# Patient Record
Sex: Male | Born: 1945 | Race: Black or African American | Hispanic: No | Marital: Married | Smoking: Light tobacco smoker
Health system: Southern US, Community
[De-identification: ages and names within clinical notes are randomized; demographics above are authoritative.]

## PROBLEM LIST (undated history)

## (undated) DIAGNOSIS — F429 Obsessive-compulsive disorder, unspecified: Secondary | ICD-10-CM

## (undated) DIAGNOSIS — R519 Headache, unspecified: Secondary | ICD-10-CM

## (undated) DIAGNOSIS — G4730 Sleep apnea, unspecified: Secondary | ICD-10-CM

## (undated) DIAGNOSIS — Z9889 Other specified postprocedural states: Secondary | ICD-10-CM

## (undated) DIAGNOSIS — C8590 Non-Hodgkin lymphoma, unspecified, unspecified site: Secondary | ICD-10-CM

## (undated) DIAGNOSIS — R7303 Prediabetes: Secondary | ICD-10-CM

## (undated) DIAGNOSIS — I10 Essential (primary) hypertension: Secondary | ICD-10-CM

## (undated) DIAGNOSIS — E079 Disorder of thyroid, unspecified: Secondary | ICD-10-CM

## (undated) DIAGNOSIS — Z8572 Personal history of non-Hodgkin lymphomas: Secondary | ICD-10-CM

## (undated) DIAGNOSIS — R002 Palpitations: Secondary | ICD-10-CM

## (undated) DIAGNOSIS — F419 Anxiety disorder, unspecified: Secondary | ICD-10-CM

## (undated) DIAGNOSIS — K2960 Other gastritis without bleeding: Secondary | ICD-10-CM

## (undated) HISTORY — DX: Obsessive-compulsive disorder, unspecified: F42.9

## (undated) HISTORY — PX: EXPLORATORY LAPAROTOMY: SUR591

## (undated) HISTORY — DX: Personal history of non-Hodgkin lymphomas: Z85.72

## (undated) HISTORY — DX: Essential (primary) hypertension: I10

## (undated) HISTORY — DX: Non-Hodgkin lymphoma, unspecified, unspecified site: C85.90

## (undated) HISTORY — PX: LYMPH NODE BIOPSY: SHX201

## (undated) HISTORY — DX: Other gastritis without bleeding: K29.60

## (undated) HISTORY — DX: Other specified postprocedural states: Z98.890

## (undated) HISTORY — PX: OTHER SURGICAL HISTORY: SHX169

## (undated) HISTORY — PX: ESOPHAGOGASTRODUODENOSCOPY ENDOSCOPY: SHX5814

## (undated) HISTORY — DX: Sleep apnea, unspecified: G47.30

## (undated) HISTORY — DX: Anxiety disorder, unspecified: F41.9

---

## 2003-05-02 ENCOUNTER — Inpatient Hospital Stay (HOSPITAL_COMMUNITY): Admission: EM | Admit: 2003-05-02 | Discharge: 2003-05-12 | Payer: Self-pay | Admitting: Emergency Medicine

## 2003-05-15 ENCOUNTER — Inpatient Hospital Stay (HOSPITAL_COMMUNITY): Admission: AD | Admit: 2003-05-15 | Discharge: 2003-05-20 | Payer: Self-pay | Admitting: Internal Medicine

## 2003-05-22 ENCOUNTER — Encounter (HOSPITAL_COMMUNITY): Admission: RE | Admit: 2003-05-22 | Discharge: 2003-06-21 | Payer: Self-pay | Admitting: Oncology

## 2003-05-22 ENCOUNTER — Encounter: Admission: RE | Admit: 2003-05-22 | Discharge: 2003-05-22 | Payer: Self-pay | Admitting: Oncology

## 2003-06-23 ENCOUNTER — Encounter: Admission: RE | Admit: 2003-06-23 | Discharge: 2003-06-23 | Payer: Self-pay | Admitting: Oncology

## 2003-06-23 ENCOUNTER — Encounter (HOSPITAL_COMMUNITY): Admission: RE | Admit: 2003-06-23 | Discharge: 2003-07-23 | Payer: Self-pay | Admitting: Oncology

## 2003-07-31 ENCOUNTER — Encounter: Admission: RE | Admit: 2003-07-31 | Discharge: 2003-07-31 | Payer: Self-pay | Admitting: Oncology

## 2003-07-31 ENCOUNTER — Encounter (HOSPITAL_COMMUNITY): Admission: RE | Admit: 2003-07-31 | Discharge: 2003-08-30 | Payer: Self-pay | Admitting: Oncology

## 2003-08-02 ENCOUNTER — Ambulatory Visit (HOSPITAL_COMMUNITY): Admission: RE | Admit: 2003-08-02 | Discharge: 2003-08-02 | Payer: Self-pay | Admitting: Oncology

## 2003-09-04 ENCOUNTER — Encounter (HOSPITAL_COMMUNITY): Admission: RE | Admit: 2003-09-04 | Discharge: 2003-10-04 | Payer: Self-pay | Admitting: Oncology

## 2003-09-04 ENCOUNTER — Encounter: Admission: RE | Admit: 2003-09-04 | Discharge: 2003-09-04 | Payer: Self-pay | Admitting: Oncology

## 2003-10-12 ENCOUNTER — Ambulatory Visit (HOSPITAL_COMMUNITY): Admission: RE | Admit: 2003-10-12 | Discharge: 2003-10-12 | Payer: Self-pay | Admitting: Internal Medicine

## 2003-11-28 ENCOUNTER — Encounter: Admission: RE | Admit: 2003-11-28 | Discharge: 2003-11-28 | Payer: Self-pay | Admitting: Oncology

## 2003-11-28 ENCOUNTER — Encounter (HOSPITAL_COMMUNITY): Admission: RE | Admit: 2003-11-28 | Discharge: 2003-12-28 | Payer: Self-pay | Admitting: Oncology

## 2004-01-29 ENCOUNTER — Encounter (HOSPITAL_COMMUNITY): Admission: RE | Admit: 2004-01-29 | Discharge: 2004-02-28 | Payer: Self-pay | Admitting: Oncology

## 2004-01-29 ENCOUNTER — Encounter: Admission: RE | Admit: 2004-01-29 | Discharge: 2004-01-29 | Payer: Self-pay | Admitting: Oncology

## 2004-02-21 ENCOUNTER — Ambulatory Visit (HOSPITAL_COMMUNITY): Admission: RE | Admit: 2004-02-21 | Discharge: 2004-02-21 | Payer: Self-pay | Admitting: Oncology

## 2004-03-01 ENCOUNTER — Ambulatory Visit: Payer: Self-pay | Admitting: Internal Medicine

## 2004-03-01 ENCOUNTER — Ambulatory Visit (HOSPITAL_COMMUNITY): Admission: RE | Admit: 2004-03-01 | Discharge: 2004-03-01 | Payer: Self-pay | Admitting: Internal Medicine

## 2004-03-05 ENCOUNTER — Ambulatory Visit (HOSPITAL_COMMUNITY): Payer: Self-pay | Admitting: Oncology

## 2004-06-04 ENCOUNTER — Encounter (HOSPITAL_COMMUNITY): Admission: RE | Admit: 2004-06-04 | Discharge: 2004-07-04 | Payer: Self-pay | Admitting: Oncology

## 2004-06-04 ENCOUNTER — Ambulatory Visit (HOSPITAL_COMMUNITY): Payer: Self-pay | Admitting: Oncology

## 2004-06-04 ENCOUNTER — Encounter: Admission: RE | Admit: 2004-06-04 | Discharge: 2004-06-04 | Payer: Self-pay | Admitting: Oncology

## 2004-08-19 ENCOUNTER — Ambulatory Visit (HOSPITAL_COMMUNITY): Admission: RE | Admit: 2004-08-19 | Discharge: 2004-08-19 | Payer: Self-pay | Admitting: Oncology

## 2004-08-22 ENCOUNTER — Encounter: Admission: RE | Admit: 2004-08-22 | Discharge: 2004-08-22 | Payer: Self-pay | Admitting: Oncology

## 2004-08-22 ENCOUNTER — Encounter (HOSPITAL_COMMUNITY): Admission: RE | Admit: 2004-08-22 | Discharge: 2004-09-21 | Payer: Self-pay | Admitting: Oncology

## 2004-08-22 ENCOUNTER — Ambulatory Visit (HOSPITAL_COMMUNITY): Payer: Self-pay | Admitting: Oncology

## 2004-11-22 ENCOUNTER — Encounter (HOSPITAL_COMMUNITY): Admission: RE | Admit: 2004-11-22 | Discharge: 2004-12-22 | Payer: Self-pay | Admitting: Oncology

## 2004-11-22 ENCOUNTER — Encounter: Admission: RE | Admit: 2004-11-22 | Discharge: 2004-11-22 | Payer: Self-pay | Admitting: Oncology

## 2004-11-22 ENCOUNTER — Ambulatory Visit (HOSPITAL_COMMUNITY): Payer: Self-pay | Admitting: Oncology

## 2005-02-24 ENCOUNTER — Encounter: Admission: RE | Admit: 2005-02-24 | Discharge: 2005-02-24 | Payer: Self-pay | Admitting: Oncology

## 2005-02-24 ENCOUNTER — Ambulatory Visit (HOSPITAL_COMMUNITY): Payer: Self-pay | Admitting: Oncology

## 2005-02-24 ENCOUNTER — Encounter (HOSPITAL_COMMUNITY): Admission: RE | Admit: 2005-02-24 | Discharge: 2005-03-26 | Payer: Self-pay | Admitting: Oncology

## 2005-03-04 ENCOUNTER — Ambulatory Visit (HOSPITAL_COMMUNITY): Admission: RE | Admit: 2005-03-04 | Discharge: 2005-03-04 | Payer: Self-pay | Admitting: Oncology

## 2005-06-05 ENCOUNTER — Encounter: Admission: RE | Admit: 2005-06-05 | Discharge: 2005-06-05 | Payer: Self-pay | Admitting: Oncology

## 2005-06-05 ENCOUNTER — Ambulatory Visit (HOSPITAL_COMMUNITY): Payer: Self-pay | Admitting: Oncology

## 2005-06-05 ENCOUNTER — Encounter (HOSPITAL_COMMUNITY): Admission: RE | Admit: 2005-06-05 | Discharge: 2005-07-05 | Payer: Self-pay | Admitting: Oncology

## 2005-07-15 ENCOUNTER — Encounter: Admission: RE | Admit: 2005-07-15 | Discharge: 2005-07-15 | Payer: Self-pay | Admitting: Oncology

## 2005-08-22 ENCOUNTER — Ambulatory Visit (HOSPITAL_COMMUNITY): Admission: RE | Admit: 2005-08-22 | Discharge: 2005-08-22 | Payer: Self-pay | Admitting: Oncology

## 2005-12-18 ENCOUNTER — Encounter: Admission: RE | Admit: 2005-12-18 | Discharge: 2006-01-02 | Payer: Self-pay | Admitting: Oncology

## 2005-12-18 ENCOUNTER — Ambulatory Visit (HOSPITAL_COMMUNITY): Payer: Self-pay | Admitting: Oncology

## 2005-12-18 ENCOUNTER — Encounter (HOSPITAL_COMMUNITY): Admission: RE | Admit: 2005-12-18 | Discharge: 2006-01-02 | Payer: Self-pay | Admitting: Oncology

## 2006-01-20 ENCOUNTER — Ambulatory Visit (HOSPITAL_COMMUNITY): Admission: RE | Admit: 2006-01-20 | Discharge: 2006-01-20 | Payer: Self-pay | Admitting: Oncology

## 2006-03-02 ENCOUNTER — Ambulatory Visit (HOSPITAL_COMMUNITY): Payer: Self-pay | Admitting: Oncology

## 2006-06-19 ENCOUNTER — Ambulatory Visit (HOSPITAL_COMMUNITY): Payer: Self-pay | Admitting: Oncology

## 2006-06-19 ENCOUNTER — Encounter (HOSPITAL_COMMUNITY): Admission: RE | Admit: 2006-06-19 | Discharge: 2006-07-19 | Payer: Self-pay | Admitting: Oncology

## 2006-07-22 ENCOUNTER — Ambulatory Visit (HOSPITAL_COMMUNITY): Admission: RE | Admit: 2006-07-22 | Discharge: 2006-07-22 | Payer: Self-pay | Admitting: Oncology

## 2007-01-22 ENCOUNTER — Ambulatory Visit (HOSPITAL_COMMUNITY): Admission: RE | Admit: 2007-01-22 | Discharge: 2007-01-22 | Payer: Self-pay | Admitting: Oncology

## 2007-01-26 ENCOUNTER — Ambulatory Visit (HOSPITAL_COMMUNITY): Payer: Self-pay | Admitting: Oncology

## 2007-01-26 ENCOUNTER — Encounter (HOSPITAL_COMMUNITY): Admission: RE | Admit: 2007-01-26 | Discharge: 2007-02-25 | Payer: Self-pay | Admitting: Oncology

## 2007-03-23 ENCOUNTER — Encounter (HOSPITAL_COMMUNITY): Admission: RE | Admit: 2007-03-23 | Discharge: 2007-04-07 | Payer: Self-pay | Admitting: Oncology

## 2007-03-24 ENCOUNTER — Ambulatory Visit (HOSPITAL_COMMUNITY): Payer: Self-pay | Admitting: Oncology

## 2007-07-21 ENCOUNTER — Ambulatory Visit (HOSPITAL_COMMUNITY): Payer: Self-pay | Admitting: Oncology

## 2007-07-21 ENCOUNTER — Encounter (HOSPITAL_COMMUNITY): Admission: RE | Admit: 2007-07-21 | Discharge: 2007-08-20 | Payer: Self-pay | Admitting: Oncology

## 2007-07-23 ENCOUNTER — Ambulatory Visit (HOSPITAL_COMMUNITY): Admission: RE | Admit: 2007-07-23 | Discharge: 2007-07-23 | Payer: Self-pay | Admitting: Oncology

## 2008-01-24 ENCOUNTER — Ambulatory Visit (HOSPITAL_COMMUNITY): Payer: Self-pay | Admitting: Oncology

## 2008-02-08 ENCOUNTER — Ambulatory Visit (HOSPITAL_COMMUNITY): Admission: RE | Admit: 2008-02-08 | Discharge: 2008-02-08 | Payer: Self-pay | Admitting: Oncology

## 2008-02-10 ENCOUNTER — Ambulatory Visit: Admission: RE | Admit: 2008-02-10 | Discharge: 2008-02-10 | Payer: Self-pay

## 2008-07-25 ENCOUNTER — Encounter (HOSPITAL_COMMUNITY): Admission: RE | Admit: 2008-07-25 | Discharge: 2008-08-24 | Payer: Self-pay | Admitting: Oncology

## 2008-07-25 ENCOUNTER — Ambulatory Visit (HOSPITAL_COMMUNITY): Payer: Self-pay | Admitting: Oncology

## 2008-08-08 ENCOUNTER — Ambulatory Visit (HOSPITAL_COMMUNITY): Admission: RE | Admit: 2008-08-08 | Discharge: 2008-08-08 | Payer: Self-pay | Admitting: Oncology

## 2009-01-22 ENCOUNTER — Encounter (HOSPITAL_COMMUNITY): Admission: RE | Admit: 2009-01-22 | Discharge: 2009-02-21 | Payer: Self-pay | Admitting: Oncology

## 2009-01-22 ENCOUNTER — Ambulatory Visit (HOSPITAL_COMMUNITY): Payer: Self-pay | Admitting: Oncology

## 2009-03-27 ENCOUNTER — Ambulatory Visit (HOSPITAL_COMMUNITY): Admission: RE | Admit: 2009-03-27 | Discharge: 2009-03-27 | Payer: Self-pay | Admitting: Oncology

## 2009-06-14 ENCOUNTER — Ambulatory Visit: Payer: Self-pay | Admitting: Cardiology

## 2009-06-15 ENCOUNTER — Observation Stay (HOSPITAL_COMMUNITY): Admission: EM | Admit: 2009-06-15 | Discharge: 2009-06-15 | Payer: Self-pay | Admitting: Internal Medicine

## 2009-06-15 ENCOUNTER — Ambulatory Visit: Payer: Self-pay | Admitting: Cardiology

## 2009-06-26 DIAGNOSIS — I10 Essential (primary) hypertension: Secondary | ICD-10-CM

## 2009-06-26 DIAGNOSIS — Z8572 Personal history of non-Hodgkin lymphomas: Secondary | ICD-10-CM | POA: Insufficient documentation

## 2009-06-26 DIAGNOSIS — F411 Generalized anxiety disorder: Secondary | ICD-10-CM | POA: Insufficient documentation

## 2009-06-26 DIAGNOSIS — G4730 Sleep apnea, unspecified: Secondary | ICD-10-CM | POA: Insufficient documentation

## 2009-06-26 HISTORY — DX: Personal history of non-Hodgkin lymphomas: Z85.72

## 2009-06-29 ENCOUNTER — Ambulatory Visit: Payer: Self-pay | Admitting: Cardiology

## 2009-06-29 DIAGNOSIS — R072 Precordial pain: Secondary | ICD-10-CM | POA: Insufficient documentation

## 2009-07-02 ENCOUNTER — Encounter: Payer: Self-pay | Admitting: Adult Health

## 2009-07-20 ENCOUNTER — Encounter: Payer: Self-pay | Admitting: Cardiology

## 2009-07-20 ENCOUNTER — Encounter (HOSPITAL_COMMUNITY)
Admission: RE | Admit: 2009-07-20 | Discharge: 2009-08-19 | Payer: Self-pay | Source: Home / Self Care | Admitting: Oncology

## 2009-07-20 ENCOUNTER — Ambulatory Visit (HOSPITAL_COMMUNITY): Payer: Self-pay | Admitting: Oncology

## 2010-02-01 ENCOUNTER — Encounter (HOSPITAL_COMMUNITY)
Admission: RE | Admit: 2010-02-01 | Discharge: 2010-03-03 | Payer: Self-pay | Source: Home / Self Care | Admitting: Oncology

## 2010-02-01 ENCOUNTER — Ambulatory Visit (HOSPITAL_COMMUNITY): Payer: Self-pay | Admitting: Oncology

## 2010-04-03 ENCOUNTER — Ambulatory Visit (HOSPITAL_COMMUNITY)
Admission: RE | Admit: 2010-04-03 | Discharge: 2010-04-03 | Payer: Self-pay | Source: Home / Self Care | Attending: Oncology | Admitting: Oncology

## 2010-04-09 ENCOUNTER — Ambulatory Visit (HOSPITAL_COMMUNITY)
Admission: RE | Admit: 2010-04-09 | Discharge: 2010-05-07 | Payer: Self-pay | Source: Home / Self Care | Attending: Oncology | Admitting: Oncology

## 2010-04-09 ENCOUNTER — Encounter (HOSPITAL_COMMUNITY)
Admission: RE | Admit: 2010-04-09 | Discharge: 2010-05-07 | Payer: Self-pay | Source: Home / Self Care | Attending: Oncology | Admitting: Oncology

## 2010-04-28 ENCOUNTER — Encounter (HOSPITAL_COMMUNITY): Payer: Self-pay | Admitting: Oncology

## 2010-05-07 NOTE — Assessment & Plan Note (Signed)
Summary: eph post cath   Visit Type:  Follow-up Primary Provider:  Nehemiah Settle  CC:  No Cardiology Complaints today.  History of Present Illness: Mr. William Joseph is a very friendly 59 AAM who we are seeing post hospitalization after transfer to [facility] for cardiac cath on 06/15/2009. 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  He was initially seen on consult in [facility]  while hospitilized for bilateral arm numbness, substernal chest discomfort, and diaphoresis. His CE at [facility] were negative but he had some EKG changes of T-Wave inversion inferiorly.  After being seen by Dr. Juanito Doom is was recommended that he undergo cardiac catherization.  This was completed by Dr.Dalton Shirlee Latch on 06/15/2009 revealing normal coronary arteries, Normal EF of 55% with no wall motion abnormalities.  The coronary system is right dominant.  The procedure was done radially.  Mr. [name] is without complaint. He feels very well and has had no recurrance of symptoms.  His R wrist is well healed from radial puncture for cath.  He remains active and is accompanied by his wife who concurs with the patient's discussion.  Current Medications (verified): 1)  Avodart 0.5 Mg Caps (Dutasteride) .... Take 1 Tab Daily 2)  Allegra-D 12 Hour 60-120 Mg Xr12h-Tab (Fexofenadine-Pseudoephedrine) .... Take 1 Tab Daily 3)  Lexapro 10 Mg Tabs (Escitalopram Oxalate) .... Take 1 Tab Daily 4)  Flomax 0.4 Mg Caps (Tamsulosin Hcl) .... Take 1 Tab Daily  Allergies (verified): No Known Drug Allergies PMH-FH-SH reviewed-no changes except otherwise noted  Review of Systems       All other systems have been reviewed and are negative unless stated above.   Vital Signs:  Patient profile:   65 year old male Height:      74 inches Weight:      228 pounds BMI:     29.38 Pulse rate:   76 / minute BP sitting:   148 / 80  (right arm)  Vitals Entered By:  Dreama Saa, CNA (June 29, 2009 2:57 PM)  Physical Exam  General:  Well developed, well nourished, in no acute distress. Lungs:  Clear bilaterally to auscultation and percussion. Abdomen:  Bowel sounds positive; abdomen soft and non-tender without masses, organomegaly, or hernias noted. No hepatosplenomegaly. Extremities:  Right wrist is free of hematoma or bleed at catheter puncture site. Psych:  Normal affect.   EKG  Procedure date:  06/29/2009  Findings:      Normal sinus rhythm with rate of:  77bpm  Impression & Recommendations:  Problem # 1:  HYPERTENSION (ICD-401.9) Assessment Unchanged  Problem # 2:  CHEST PAIN UNSPECIFIED (ICD-786.50) Cardiac catherization is reassuring.  It may have been stress induced.  We will follow him on as needed basis only. Happy to see him again at the discretion of Dr. Juanetta Gosling,  The patient states he feels better about his heart since having the catherization.  Patient Instructions: 1)  Your physician recommends that you schedule a follow-up appointment in: as needed  2)  Your physician recommends that you continue on your current medications as directed. Please refer to the Current Medication list given to you today.

## 2010-05-07 NOTE — Letter (Signed)
Summary: Discharge Summary  Discharge Summary   Imported By: Faythe Ghee 07/20/2009 15:35:01  _____________________________________________________________________  External Attachment:    Type:   Image     Comment:   External Document

## 2010-06-17 LAB — GLUCOSE, CAPILLARY: Glucose-Capillary: 112 mg/dL — ABNORMAL HIGH (ref 70–99)

## 2010-06-19 LAB — COMPREHENSIVE METABOLIC PANEL
ALT: 17 U/L (ref 0–53)
Albumin: 4.1 g/dL (ref 3.5–5.2)
CO2: 26 mEq/L (ref 19–32)
Chloride: 104 mEq/L (ref 96–112)
Sodium: 141 mEq/L (ref 135–145)
Total Bilirubin: 0.8 mg/dL (ref 0.3–1.2)
Total Protein: 6.9 g/dL (ref 6.0–8.3)

## 2010-06-19 LAB — DIFFERENTIAL
Basophils Absolute: 0 10*3/uL (ref 0.0–0.1)
Eosinophils Relative: 3 % (ref 0–5)
Lymphocytes Relative: 51 % — ABNORMAL HIGH (ref 12–46)
Lymphs Abs: 2.9 10*3/uL (ref 0.7–4.0)
Monocytes Absolute: 0.4 10*3/uL (ref 0.1–1.0)
Monocytes Relative: 8 % (ref 3–12)
Neutro Abs: 2.2 10*3/uL (ref 1.7–7.7)
Neutrophils Relative %: 39 % — ABNORMAL LOW (ref 43–77)

## 2010-06-19 LAB — CBC
Hemoglobin: 12.9 g/dL — ABNORMAL LOW (ref 13.0–17.0)
MCH: 30.2 pg (ref 26.0–34.0)
MCHC: 33 g/dL (ref 30.0–36.0)
RBC: 4.26 MIL/uL (ref 4.22–5.81)

## 2010-06-19 LAB — SEDIMENTATION RATE: Sed Rate: 10 mm/hr (ref 0–16)

## 2010-06-25 LAB — COMPREHENSIVE METABOLIC PANEL
ALT: 19 U/L (ref 0–53)
Albumin: 3.8 g/dL (ref 3.5–5.2)
Chloride: 111 mEq/L (ref 96–112)
Creatinine, Ser: 0.93 mg/dL (ref 0.4–1.5)
GFR calc Af Amer: >60 mL/min (ref >60)
Glucose, Bld: 97 mg/dL (ref 70–99)
Potassium: 4.4 mEq/L (ref 3.5–5.1)
Sodium: 140 mEq/L (ref 135–145)
Total Protein: 6.6 g/dL (ref 6.0–8.3)

## 2010-06-25 LAB — DIFFERENTIAL
Basophils Absolute: 0 10*3/uL (ref 0.0–0.1)
Basophils Relative: 0 % (ref 0–1)
Eosinophils Absolute: 0.4 10*3/uL (ref 0.0–0.7)
Eosinophils Relative: 7 % — ABNORMAL HIGH (ref 0–5)
Lymphocytes Relative: 42 % (ref 12–46)
Monocytes Absolute: 0.4 10*3/uL (ref 0.1–1.0)
Monocytes Relative: 8 % (ref 3–12)
Neutrophils Relative %: 43 % (ref 43–77)

## 2010-06-25 LAB — LACTATE DEHYDROGENASE: LDH: 128 U/L (ref 94–250)

## 2010-06-25 LAB — CBC
MCHC: 34.2 g/dL (ref 30.0–36.0)
Platelets: 162 10*3/uL (ref 150–400)
WBC: 5.3 10*3/uL (ref 4.0–10.5)

## 2010-06-25 LAB — SEDIMENTATION RATE: Sed Rate: 3 mm/hr (ref 0–16)

## 2010-06-30 LAB — CBC
HCT: 38 % — ABNORMAL LOW (ref 39.0–52.0)
MCHC: 33.9 g/dL (ref 30.0–36.0)
Platelets: 182 10*3/uL (ref 150–400)
RDW: 14 % (ref 11.5–15.5)
WBC: 5.7 10*3/uL (ref 4.0–10.5)

## 2010-06-30 LAB — BASIC METABOLIC PANEL
BUN: 8 mg/dL (ref 6–23)
GFR calc non Af Amer: >60 mL/min (ref >60)
Glucose, Bld: 95 mg/dL (ref 70–99)
Potassium: 4.5 mEq/L (ref 3.5–5.1)

## 2010-06-30 LAB — DIFFERENTIAL
Basophils Absolute: 0 10*3/uL (ref 0.0–0.1)
Eosinophils Relative: 5 % (ref 0–5)
Lymphocytes Relative: 43 % (ref 12–46)
Neutro Abs: 2.3 10*3/uL (ref 1.7–7.7)
Neutrophils Relative %: 41 % — ABNORMAL LOW (ref 43–77)

## 2010-06-30 LAB — CARDIAC PANEL(CRET KIN+CKTOT+MB+TROPI)
CK, MB: 1.6 ng/mL (ref 0.3–4.0)
CK, MB: 1.7 ng/mL (ref 0.3–4.0)
Relative Index: 1 (ref 0.0–2.5)
Relative Index: 1 (ref 0.0–2.5)
Troponin I: 0.01 ng/mL (ref 0.00–0.06)

## 2010-06-30 LAB — POCT CARDIAC MARKERS
Myoglobin, poc: 62.4 ng/mL (ref 12–200)
Troponin i, poc: <0.05 ng/mL (ref 0.00–0.09)

## 2010-07-08 LAB — GLUCOSE, CAPILLARY: Glucose-Capillary: 104 mg/dL — ABNORMAL HIGH (ref 70–99)

## 2010-07-11 LAB — DIFFERENTIAL
Basophils Absolute: 0 10*3/uL (ref 0.0–0.1)
Basophils Relative: 1 % (ref 0–1)
Eosinophils Absolute: 0.2 10*3/uL (ref 0.0–0.7)
Eosinophils Relative: 5 % (ref 0–5)
Monocytes Absolute: 0.4 10*3/uL (ref 0.1–1.0)

## 2010-07-11 LAB — COMPREHENSIVE METABOLIC PANEL
ALT: 19 U/L (ref 0–53)
AST: 20 U/L (ref 0–37)
Albumin: 4 g/dL (ref 3.5–5.2)
Alkaline Phosphatase: 49 U/L (ref 39–117)
CO2: 27 mEq/L (ref 19–32)
Chloride: 107 mEq/L (ref 96–112)
GFR calc Af Amer: >60 mL/min (ref >60)
GFR calc non Af Amer: >60 mL/min (ref >60)
Potassium: 4.1 mEq/L (ref 3.5–5.1)
Total Bilirubin: 0.8 mg/dL (ref 0.3–1.2)

## 2010-07-11 LAB — CBC
HCT: 38.4 % — ABNORMAL LOW (ref 39.0–52.0)
Platelets: 165 10*3/uL (ref 150–400)
RDW: 13.9 % (ref 11.5–15.5)

## 2010-07-11 LAB — SEDIMENTATION RATE: Sed Rate: 6 mm/hr (ref 0–16)

## 2010-07-17 LAB — IMMUNOFIXATION ELECTROPHORESIS
IgG (Immunoglobin G), Serum: 1280 mg/dL (ref 694–1618)
Total Protein ELP: 7.4 g/dL (ref 6.0–8.3)

## 2010-07-17 LAB — DIFFERENTIAL
Basophils Absolute: 0 10*3/uL (ref 0.0–0.1)
Lymphocytes Relative: 44 % (ref 12–46)
Lymphs Abs: 2.1 10*3/uL (ref 0.7–4.0)
Monocytes Absolute: 0.4 10*3/uL (ref 0.1–1.0)
Neutro Abs: 2.1 10*3/uL (ref 1.7–7.7)

## 2010-07-17 LAB — COMPREHENSIVE METABOLIC PANEL
Albumin: 3.9 g/dL (ref 3.5–5.2)
BUN: 12 mg/dL (ref 6–23)
Calcium: 9.6 mg/dL (ref 8.4–10.5)
Chloride: 104 mEq/L (ref 96–112)
Creatinine, Ser: 0.9 mg/dL (ref 0.4–1.5)
Total Bilirubin: 0.6 mg/dL (ref 0.3–1.2)

## 2010-07-17 LAB — SEDIMENTATION RATE: Sed Rate: 10 mm/hr (ref 0–16)

## 2010-07-17 LAB — CBC
HCT: 40.1 % (ref 39.0–52.0)
MCHC: 33 g/dL (ref 30.0–36.0)
MCV: 93.6 fL (ref 78.0–100.0)
Platelets: 161 10*3/uL (ref 150–400)
RDW: 13.3 % (ref 11.5–15.5)
WBC: 4.7 10*3/uL (ref 4.0–10.5)

## 2010-07-17 LAB — PROTEIN ELECTROPH W RFLX QUANT IMMUNOGLOBULINS
Alpha-1-Globulin: 3.7 % (ref 2.9–4.9)
Alpha-2-Globulin: 9 % (ref 7.1–11.8)
Beta 2: 5.5 % (ref 3.2–6.5)
Gamma Globulin: 14.7 % (ref 11.1–18.8)

## 2010-08-20 NOTE — Procedures (Signed)
NAME:  William Joseph, William Joseph        ACCOUNT NO.:  [id number]   MEDICAL RECORD NO.:  [id number]          PATIENT TYPE:  OUT   LOCATION:  SLEE                          FACILITY:  [facility]   PHYSICIAN:  Kofi A. Gerilyn Pilgrim, M.D. DATE OF BIRTH:  10-09-1945   DATE OF PROCEDURE:  DATE OF DISCHARGE:  02/10/2008                             SLEEP DISORDER REPORT   POLYSOMNOGRAPHY REPORT   REFERRING PHYSICIAN:  Edward L. Juanetta Gosling, MD   INDICATIONS:  This is a 65 year old man who presents with witnessed  apnea, loud snoring, and hypersomnia.  He is being evaluated for  obstructive sleep apnea syndrome.   MEDICATIONS:  1. Flomax.  2. Lexapro.  3. Antivert.   Epworth sleepiness scale 1.  BMI 28.   ARCHITECTURAL SUMMARY:  This is a split night study with the first part  being a diagnostic and the second part a titration.  The total recording  time is 416 minutes.  Sleep efficiency 72%.  Sleep latency 30 minutes.  REM latency 216 minutes.   RESPIRATORY SUMMARY:  The diagnostic AHI is 20.  The baseline oxygen  saturation 97%.  The lowest saturation is 90%.  The patient was titrated  on positive pressure between 5 and 11.  The optimal pressure is 11 with  resolution of obstructive events.   LIMB MOVEMENT SUMMARY:  PLM index of 26 with the most of the events  occurring during the first half of the study and less during the  titration portion.   ELECTROCARDIOGRAM SUMMARY:  Average heart rate 72 with no significant  dysrhythmias observed.   IMPRESSION:  1. Moderate obstructive sleep apnea syndrome, which responded well to      continuous positive airway pressure of      11.  2. Moderate periodic limb movement disorder of sleep.   Thanks for this referral.      Kofi A. Gerilyn Pilgrim, M.D.  Electronically Signed     KAD/MEDQ  D:  02/15/2008  T:  02/15/2008  Job:  [id number]

## 2010-08-23 NOTE — Procedures (Signed)
NAME:  PHILLP, DOLORES                    ACCOUNT NO.:  [id number]   MEDICAL RECORD NO.:  [id number]                  PATIENT TYPE:   LOCATION:                                       FACILITY:   PHYSICIAN:  Vida Roller, M.D.                DATE OF BIRTH:   DATE OF PROCEDURE:  07/14/2003  DATE OF DISCHARGE:                                  ECHOCARDIOGRAM   TAPE NUMBER:  LB [id number], tape count [id number] through 4124.   INDICATION:  This is a 65 year old man who is receiving chemotherapy for  lymphoma.  This is an assessment for LV function.   TECHNICAL QUALITY:  Adequate.   M-MODE TRACINGS:  1. The aorta is 37 mm.  2. The left atrium is 33 mm.  3. The septum is 12 mm.  4. The posterior wall is 12 mm.  5. The left ventricular diastolic dimension is 43 mm.  6. The left ventricular systolic dimension is 31 mm.   2-D AND DOPPLER IMAGING:  1. The left ventricle is normal size with normal systolic function.  No wall-     motion abnormalities are seen.  Diastolic function appears normal.  2. The right ventricle is normal size with normal systolic function.  3. The left and right atrium are both normal size.  There is no evidence of     an antral septal defect.  4. The aortic valve is trileaflet, tricommisure with no evidence of stenosis     or regurgitation.  5. The mitral valve is morphologically unremarkable with no stenosis or     regurgitation.  6. The tricuspid valve is morphologically unremarkable with trivial     insufficiency.  No stenosis is seen.  7. The pulmonic valve is not well seen.  8. The inferior vena cava is normal size.  9. The ascending aorta was not well seen.  10.      The pericardial structures are normal.      ___________________________________________                                            Vida Roller, M.D.   JH/MEDQ  D:  07/14/2003  T:  07/14/2003  Job:  [id number]

## 2010-08-23 NOTE — Op Note (Signed)
NAME:  William Joseph, William Joseph                    ACCOUNT NO.:  [id number]   MEDICAL RECORD NO.:  [id number]                   PATIENT TYPE:  AMB   LOCATION:  DAY                                  FACILITY:  [facility]   PHYSICIAN:  Dalia Heading, M.D.               DATE OF BIRTH:  08-12-1945   DATE OF PROCEDURE:  10/12/2003  DATE OF DISCHARGE:                                 OPERATIVE REPORT   PREOPERATIVE DIAGNOSIS:  History of lymphoma, finished with chemotherapy.   POSTOPERATIVE DIAGNOSIS:  History of lymphoma, finished with chemotherapy.   PROCEDURE:  Port-A-Cath removal.   SURGEON:  Dalia Heading, M.D.   ANESTHESIA:  Local.   INDICATIONS:  The patient is a 65 year old black male who has a history of  lymphoma.  He is finished with his chemotherapy and now presents for Port-A-  Cath removal.  The risks and benefits of the procedure were fully explained  to the patient who gave informed consent.   DESCRIPTION OF PROCEDURE:  The patient was placed in the supine position.  The left upper chest was prepped and draped using the usual sterile  technique with Betadine.  Surgical site confirmation was performed; 1%  Xylocaine was used for local anesthesia.   An incision was made through the previous Port-A-Cath insertion site.  This  was taken down to the port.  The port was removed without difficulty.  The  subcutaneous layer was reapproximated using a 3-0 Vicryl interrupted suture.  The skin was closed using a 4-0 Vicryl subcuticular suture.  Steri-Strips  and a dry sterile dressing were applied.   All tape and needle counts were correct at the end of the procedure.  The  patient tolerated the procedure well.  The patient was transferred back to  day surgery in a stable condition.   COMPLICATIONS:  None.   SPECIMEN:  Port-A-Cath.   BLOOD LOSS:  Minimal.      ___________________________________________                                            Dalia Heading, M.D.   MAJ/MEDQ  D:  10/12/2003  T:  10/12/2003  Job:  [id number]   cc:   Dalia Heading, M.D.  [address] [place]  [place] [address]  Fax: [phone]

## 2010-08-23 NOTE — Consult Note (Signed)
NAME:  William Joseph, William Joseph                    ACCOUNT NO.:  [id number]   MEDICAL RECORD NO.:  [id number]                   PATIENT TYPE:  INP   LOCATION:  A322                                 FACILITY:  [facility]   PHYSICIAN:  Ladona Horns. Neijstrom, MD               DATE OF BIRTH:  15-Nov-1945   DATE OF CONSULTATION:  05/03/2003  DATE OF DISCHARGE:                                   CONSULTATION   DIAGNOSES:  1. Enlarged lymph nodes in the peripancreatic and mesentery most likely     secondary to non-Hodgkin's lymphoma.  2. History of ulcer disease dating back to age 30 though he has not had EGD     for at least 10 years.  3. History of cigarette smoking for many years.   HISTORY:  This is a 65 year old African-American gentleman from [place],  [place] who was born in [place], however, who was raised in  [place], who was visiting his wife who lives down here now that she is  retired.  He was having increasing abdominal pain which has gotten worse  over the last couple of weeks.  He has had intermittent pain in the abdomen  for many years which he has attributed primarily to his ulcer disease  diagnosed at age 31 but he has not had EGD for at least 10 years, he states.   Over the last 2 weeks he has also not been able to eat because his pain gets  worse with eating.  He feels bloated and the pain seems to get worse.  It  seemed to go into his lower chest as well yesterday so he came to the  emergency room.   He usually has a bowel movement every 1-3 days.  He has had some nausea but  no vomiting.  He has not had night sweats or fevers or chills.  He has lost  approximately 12 pounds in the last 2 weeks.   He has no lumps or bumps that he is aware of.  He has not had hepatitis.  He  has not had asthma.  He has not had TB.   He has had some foreign travel in the [place] but he had not had known  exposure that he is aware of.  He has not been in the [employer].  As  mentioned, he  still smokes a half a pack to a pack of cigarettes a day.  He  and his wife have been married many years.  They have three children ages  43, 36, and 49, and they are in good health to the best of his knowledge.  He does not really use alcohol, does not use illicit drugs.   He had a CT scan of his abdomen which shows diffuse adenopathy in the  mesenteric area, some peripancreatic nodes, and a questionable cyst at the  ampulla of Vater.   He has been admitted because of worsening pain which has  improved with some  morphine sulfate intravenously.   FAMILY HISTORY:  Both of his parents are still alive and in fairly good  health though his father is 73 with diabetes.  He has one brother who died  of an accidental gunshot wound.  One brother and two sisters are still alive  in good health.  He works for [employer] as a Quarry manager, has  done so for many years.   He has taken in the past Zantac or Tagamet but nothing on a regular basis.   His review of systems is otherwise noncontributory.   PHYSICAL EXAMINATION:  VITAL SIGNS:  He is presently afebrile.  His blood  pressure is 116/50, his pulse is 68 and regular, respirations are 14 and  unlabored.  GENERAL:  He is very fidgety in bed, does not seem to appear to be able to  get comfortable.  HEENT:  He has some remaining teeth.  Tongue is normal in the midline.  Pupils appear equally round.  They are small, they do not really react to  light, probably due to the morphine.  NEUROLOGIC:  He is alert and very oriented.  He is right-handed.  Strength  is symmetrical in all groups tested.  LYMPH NODES:  His lymph node exam is negative in the cervical,  supraclavicular, infraclavicular, axillary, epitrochlear, and inguinal  areas.  ABDOMEN:  His abdomen is slightly firm.  There is some fullness in the  epigastric area but no distinct mass.  He does not have hepatosplenomegaly  in my opinion.  LUNGS:  His lungs are clear to  auscultation and percussion.  HEART:  His heart shows a regular rhythm and rate without murmur, rub, or  gallop.  Bowel sounds are present.  EXTREMITIES:  He has no peripheral edema.  Pulses are 1-2+ and symmetrical  and neurologically he is grossly intact.   This gentleman's lab work does show a mild anemia but normal liver enzymes,  normal BUN, normal creatinine, albumin slightly low at 3.3, lipase at 39  which is unremarkable, and a urinalysis which shows trace amount of ketones,  3-6 red cells per high powered field.  His white count is normal, platelets  are normal, differential is unremarkable.   I have had a long talk with him and his family about the fact that he most  likely has a lymphoma and he does need, I think, a tissue diagnosis.  It  would be nice to have a minilaparotomy to get a chunk of one of these large  lymph nodes in the mesentery.  Three of the nodes together form a mass 9 x 6  cm so tissue is readily available just below the abdominal wall.   He then needs chemotherapy regardless of whether this is a low,  intermediate, or high-grade lymphoma since he is symptomatic with pain in  his abdomen which radiates to his back.   He does not have obvious muscle spasticity but the pain is quite intense, he  states.   We will be in touch with Dr. Josefine Class.  She and I will discuss his case and  get on with getting him a surgeon to see him as soon as possible.  He does  need a CT of the chest.  Eventually he may need a PET scan but that can be  delayed since I think, right now, he needs a diagnosis and therapy in the  very near future.      ___________________________________________  Ladona Horns. Mariel Sleet, MD   ESN/MEDQ  D:  05/03/2003  T:  05/03/2003  Job:  [id number]   cc:   Hanley Hays. Dechurch, M.D.  [address]  [place]  [place] [address]  Fax: [phone]   Dalia Heading, M.D. [address], [place]  [place] [address]  Fax: [phone]

## 2010-08-23 NOTE — H&P (Signed)
NAME:  William Joseph, William Joseph                    ACCOUNT NO.:  [id number]   MEDICAL RECORD NO.:  [id number]                   PATIENT TYPE:  INP   LOCATION:  A322                                 FACILITY:  [facility]   PHYSICIAN:  Hanley Hays. Dechurch, M.D.           DATE OF BIRTH:  Feb 08, 1946   DATE OF ADMISSION:  05/02/2003  DATE OF DISCHARGE:                                HISTORY & PHYSICAL   HISTORY OF PRESENT ILLNESS:  A 65 year old African-American male from  [place], [place], who is visiting the area with a past medical history  remarkable for peptic ulcer disease diagnosed at age 10, which has been  quiet who gives a history of intermittent abdominal pain over the years, but  nothing to the point that he thought it needed evaluation.  It was most  noted over the last several months, but over the past two weeks he has noted  some increasing abdominal pain, bloating-type sensation, usually post-  prandial, he notes it in the right upper quadrant radiating into the left  lower quadrant.  He has had no associated symptoms aside from the bloating.  He has had some nausea, but no vomiting.  He states he has a bowel movement  every one to three days which has not changed.  He notes he has been a  little bit more constipated recently, but nothing that would draw attention  to it.  He has had no GU complaints.  He may have lost several pounds over  the last two weeks he states, but no dramatic changes.  He has had no  limitations in his function.  He has had no fever, chills, no night sweats,  no rashes.  He has noted no lumps or bumps.  He has had no joint complaints.  He has never had a history of jaundice.  He has never had any gallbladder  problems.  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  He has not had any Financial planner.  He smokes 1/2 to 1 pack  per day.  He is married x32  years.  He has three children, though they are  adopted.  He has no significant alcohol use or abuse.  No illicit drugs.  The patient was seen at a local hospital for same.  He had a CT scan of his  belly, but this was a non-contrasted study which mentioned some question of  possible mesenteric adenopathy.  It was recommended to follow up with a  contrast scan.   FAMILY HISTORY:  Father 18 with diabetes.  Mother is in her 89s with a  history of __________ heart attack and diabetes.  No history of cancer to  his knowledge, with the exception of an aunt who had some sort of stomach  cancer.   MEDICATIONS:  He is currently taking some Percocet that was  prescribed at a  local emergency room and some p.r.n. Zantac or Tagamet, but nothing  routinely.   ALLERGIES:  No allergies to his knowledge.   PAST MEDICAL HISTORY:  Peptic ulcer disease diagnosed by question of upper  GI many years prior.  Records are not yet available.   PAST SURGICAL HISTORY:  No surgeries, no other hospitalizations.  He was  healthy as a child.   PHYSICAL EXAMINATION:  GENERAL:  A well-developed, well-nourished, pleasant,  alert gentleman in no distress currently.  VITAL SIGNS:  Blood pressure is 130/80, pulse is 70 and regular,  respirations unlabored, he was afebrile.  I do not have the exact  temperature in front of me.  NECK:  Without adenopathy.  He had no JVD, no thyromegaly.  HEENT:  His oropharynx is moist.  His teeth are in good repair.  There are  no lesions noted.  LUNGS:  Clear to auscultation, though diminished, no rales or rhonchi are  present.  HEART:  Regular rate and rhythm, no murmurs, rubs, or gallops.  ABDOMEN:  Protuberant, but soft.  There is positive bowel sounds noted.  He  has some fullness at the mid to left umbilical area with suggestion of mass,  though nontender.  Liver edge is about 3 cm below the right costal margin.  Again, there is some fullness in the area of the gallbladder  fossa, but  nontender.  There is no inguinal or axillary adenopathy.  RECTAL:  Deferred here in the emergency room.  EXTREMITIES:  Without cyanosis, clubbing, or edema.  He has good distal  pulses.  SKIN:  Unremarkable.  NEUROLOGIC:  Completely intact without any findings.   LABORATORY DATA:  In the emergency room reveals a CBC, CMP, lipase  remarkable for hemoglobin of 12.6, hematocrit 39, MCV is 89, WBC is 6.3, 70  segs, 18 lymphs, 2 eosinophils, 1 basophil, 10 monocytes.  BUN is 10,  creatinine 1, albumin is 3.3, otherwise unremarkable.  Urinalysis reveals a  specific gravity of 1.03, trace ketones, trace protein, 3 to 6 red cells,  otherwise unremarkable.  CT of the abdomen with oral and IV contrast reveals  extensive abdominal lymphadenopathy particularly in the lower to mid  abdomen.  There is a suggestion of a lesion about the ampulla raising the  question of a choledochocele versus an ampullary mass.  Chest x-ray did not  reveal any active disease.   ASSESSMENT AND PLAN:  1. Abdominal pain in a healthy gentleman with worrisome lymphadenopathy on     [place].  Given his abdominal pain, he is being     admitted to the hospital with intravenous fluids and analgesics as     needed.  We will consult oncology in the a.m., as the patient is going to     need a tissue biopsy to be coordinated through radiology and his     outpatient followup will need to be coordinated.  They desire to be     treated, and therapy will need to be coordinated.  This was discussed at     length with the patient and the family, and they wish to pursue in this     manner.  Given the question of an ampullary mass, he may need an     endoscopic retrograde cholangiopancreatography.  He does not appear to be     obstructed, but we will obtain an ultrasound to further evaluate that     area. 2. Tobacco  abuse.  No problems at this point.  We will monitor.  3. History of peptic ulcer  disease.  We will continue Protonix.     ___________________________________________                                         Hanley Hays Josefine Class, M.D.   FED/MEDQ  D:  05/02/2003  T:  05/02/2003  Job:  [id number]

## 2010-08-23 NOTE — Discharge Summary (Signed)
NAME:  William Joseph, William Joseph                    ACCOUNT NO.:  [id number]   MEDICAL RECORD NO.:  [id number]                   PATIENT TYPE:  INP   LOCATION:  A322                                 FACILITY:  [facility]   PHYSICIAN:  Vania Rea, M.D.              DATE OF BIRTH:  Jan 20, 1946   DATE OF ADMISSION:  05/02/2003  DATE OF DISCHARGE:  05/12/2003                                 DISCHARGE SUMMARY   PRIMARY CARE PHYSICIAN:  Ladona Horns. Neijstrom, M.D.   DISCHARGE DIAGNOSES:  1. 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  2. Chronic pain secondary to #1.  3. Anxiety disorder secondary to #1.  4. Episodic hypertension secondary to #2.  5. Status post port-A-Cath placement.  6. Status post minilaparotomy with lymph node biopsy.  7. Status post bone marrow biopsy.  8. History of tobacco abuse.   DISPOSITION:  Discharged to home.   DISCHARGE CONDITION:  Stable.   DISCHARGE MEDICATIONS:  1. Allopurinol 100 mg daily.  2. Duragesic patch 100 mcg q.72h.  3. OxyIR 5-10 mg q.3-4h. when necessary.  4. Colace 200 mg twice daily, hold for diarrhea.   HOSPITAL COURSE:  Please refer to History and Physical of May 02, 2003.  This is a 65 year old [place] American gentleman, living and working in  [place] but visiting his wife in [place], who has been  having intermittent abdominal pain for the past few months, worse over the  past two weeks and sought help at our emergency room.  The patient is found  to have abdominal mass and admitted and over a period of days, worked up and  eventually diagnosed with B cell lymphoma, probably omental type.  The  patient had CT scan of his chest and abdomen and pelvis which revealed  lesions suggestive of malignant mesenteric and periotic lymph nodes.  There  was no apparent involvement of the chest.  The patient subsequently  underwent CT scan at [facility] and results of this revealed  involvement of the mesenteric  lymph nodes and also an aortocaval lymph node  at the T12, L1 level.  There was no evidence of involvement above the  diaphragm or of the spleen or liver.   It is to be noted, incidentally, that the screening CT done at [facility] on January 20th as well as showing possible mesenteric masses, also  describe three low density lesions in the right and left lobes of the liver.   A primary reading of the CT scan by radiologist at [facility], suggested  there may have been retropharyngeal involvement.  However, head and neck CT  subsequently failed to reveal any evidence of head and neck involvement.   The patient had minilaparotomy with mesenteric lymph node biopsy and  placement of a left subclavian port-A-Cath on January 28th.  The biopsy  confirmed B cell lymphoma.  Flow cytometry  and microscopic results such as  the omental cell.  On February 2nd, the patient had bone marrow biopsy and aspirate from the  left iliac crest and flow cytometry and cytology on specimens from the bone  marrow failed to show any evidence of bone marrow involvement by lymphoma.  Throughout his hospital course, the patient had severe pain requiring  maximum doses on Dilaudid as well as supplementation with IV Dilaudid or  Vicodin or OxyIR.  Eventually, supplement with Fentanyl patch.   The patient's blood pressure was sometimes noted to be elevated and he  related elevations in his blood pressure through periods of increasing pain.   The patient's status was eventually confirmed to be most probably omental  cell lymphoma, confined to the subdiaphragmatic regions involving mesenteric  and periotic lymph nodes, but no involvement of liver or spleen.  The  patient was offered the opportunity to go back to [place] for treatment or to  remain in the [place] area.  The patient elected to go home over the  weekend to attend to some personal business and to return on Monday morning,  February 7th to  start chemotherapy.  In preparation for chemotherapy, the  patient had MUGA scan which revealed an estimated ejection fraction of 55%.   FOLLOWUP:  The patient is to return to the third floor on Monday, February  7th for readmission to start chemotherapy.  Chemotherapy to be supervised by  Ladona Horns. Neijstrom, M.D.   SPECIAL INSTRUCTIONS:  1. The patient is advised to drink plenty liquids.  2. Patient is to return to the emergency room for excessive drowsiness.  3. Patient is to return to the emergency room for any breathing difficulty.     ___________________________________________                                         Vania Rea, M.D.   LC/MEDQ  D:  05/18/2003  T:  05/19/2003  Job:  [id number]

## 2010-08-23 NOTE — Discharge Summary (Signed)
NAME:  William Joseph, William Joseph                    ACCOUNT NO.:  [id number]   MEDICAL RECORD NO.:  [id number]                   PATIENT TYPE:  INP   LOCATION:  A328                                 FACILITY:  [facility]   PHYSICIAN:  Hanley Hays. Dechurch, M.D.           DATE OF BIRTH:  1945/04/18   DATE OF ADMISSION:  05/15/2003  DATE OF DISCHARGE:  05/20/2003                                 DISCHARGE SUMMARY   DIAGNOSES:  1. Non-Hodgkin's lymphoma.  2. Admission for initiation of chemotherapy.  3. Reactive depression.  4. Anemia.   DISPOSITION:  Patient discharged to home, follow up per Dr. Mariel Sleet,  May 22, 2003.  The patient has Percocet one to two q.4-6h. p.r.n. pain  at home and allopurinol 100 mg daily to continue.   HOSPITAL COURSE:  A 65 year old African American male diagnosed by tissue  biopsy with non-Hodgkin's lymphoma manifested as abdominal pain. The patient  was admitted for institution of VP-16/vincristine/Adriamycin/Cytoxan/Rituxan  regimen. He tolerated the first cycle very well. Hemoglobin at the time of  discharge was 9.3. Other significant abnormalities in his labs include an  albumin of 2.7 and ALT of 48. He had literally not problems during the  hospital stay and was discharged to home in stable condition for follow-up  as an outpatient.  At time of discharge he is alert and oriented, no  complaints, no distress. Lungs clear. Heart regular. No murmur. Abdomen  soft, nontender. Well healed surgical scar. He has no evidence of mass on  exam today. Extremities without clubbing, cyanosis, or edema.  Neurologic  exam is intact. Blood pressure 136/70, pulse 74 and regular, and no fever.   ASSESSMENT/PLAN:  As noted above.     ___________________________________________                                         Hanley Hays. Josefine Class, M.D.   FED/MEDQ  D:  05/20/2003  T:  05/20/2003  Job:  [id number]

## 2010-08-23 NOTE — Op Note (Signed)
NAME:  William Joseph, William Joseph                    ACCOUNT NO.:  [id number]   MEDICAL RECORD NO.:  [id number]                   PATIENT TYPE:  INP   LOCATION:  A322                                 FACILITY:  [facility]   PHYSICIAN:  Dalia Heading, M.D.               DATE OF BIRTH:  [birth year]   DATE OF PROCEDURE:  05/05/2003  DATE OF DISCHARGE:                                 OPERATIVE REPORT   PREOPERATIVE DIAGNOSES:  Mesenteric adenopathy, probable lymphoma.   POSTOPERATIVE DIAGNOSES:  Mesenteric adenopathy, probable lymphoma.   PROCEDURE:  Port-A-Cath insertion, exploratory laparotomy, mesenteric lymph  node biopsy.   SURGEON:  Dalia Heading, M.D.   ANESTHESIA:  General endotracheal.   INDICATIONS FOR PROCEDURE:  The patient is a 65 year old black male who was  recently found to have mesenteric lymphadenopathy.  He is presumed to have  lymphoma and tissue diagnosis is needed to help with the decision for  chemotherapy. The risks and benefits of the procedures including bleeding,  infection, and pneumothorax were fully explained to the patient, who gave  informed consent.   DESCRIPTION OF PROCEDURE:  The patient was placed in the supine position  after induction of general endotracheal anesthesia.  The left upper chest  was prepped and draped using the usual sterile technique with Betadine.  Surgical site confirmation was performed.   A transverse incision was made below the left clavicle. The subcutaneous  pocket was then formed.  Using the Seldinger technique, a guidewire was  advanced into the superior vena cava under fluoroscopic guidance without  difficulty.  An introducer and peel-away sheath were placed over the  guidewire. The catheter was then inserted through the peel-away sheath and  the peel-away sheath was removed.  The catheter was attached to the port and  the port placed in the subcutaneous pocket.  Adequate position was confirmed  by fluoroscopy.  The port was  flushed with 3000 units of heparin.  The  subcutaneous layer was reapproximated using a 4-0 Vicryl interrupted suture.  The skin was closed using a 4-0 Vicryl subcuticular suture.  Steri-Strips  and a dry sterile dressing were applied.   Next, the abdomen was prepped and draped using the usual sterile technique  with Betadine.  Surgical site confirmation was performed.  A left  periumbilical midline incision was made down to the fascia.  The peritoneal  cavity was entered into without difficulty.  On inspection, the patient had  a significant amount of lymphadenopathy in the mesentery of the small bowel.  Several lymph nodes were removed without difficulty.  The specimen was sent  to Dr. Bascom Levels of pathology for flow cytometry and further testing.  Any  bleeding was controlled using Bovie electrocautery and Surgicel.  Of note  was the fact that some of the perineal fluid appeared to be lymphatic in  nature. The fascia was reapproximated using a #1 novofil running suture. The  subcutaneous layer was irrigated with normal  saline and the skin was closed  using staples. Betadine ointment and a dry sterile dressing were applied.   All tape and needle counts were correct at the end of the procedure.  The  patient was extubated in the operating room and went back to the recovery  room awake in stable condition.  Complications none.  Specimen, lymph nodes,  mesentery.  Blood loss minimal.      ___________________________________________                                            Dalia Heading, M.D.   MAJ/MEDQ  D:  05/05/2003  T:  05/05/2003  Job:  [id number]   cc:   Hanley Hays. Dechurch, M.D.  [address]  [place]  [place] [address]  Fax: [phone]   Ladona Horns. Neijstrom, MD  [address]  [place]  [place] [address]  Fax: [phone]

## 2010-08-23 NOTE — Procedures (Signed)
NAME:  William Joseph, William Joseph NO.:  [id number]   MEDICAL RECORD NO.:  [id number]                  PATIENT TYPE:  PREC   LOCATION:                                       FACILITY:   PHYSICIAN:  Dalton Bing, M.D.               DATE OF BIRTH:   DATE OF PROCEDURE:  10/02/2003  DATE OF DISCHARGE:                                  ECHOCARDIOGRAM   CLINICAL DATA:  A 65 year old gentleman with a history of lymphoma,  receiving chemotherapy.   M-MODE:  Aorta 3.6, left atrium 3.1, septum 1.1, posterior wall, 1.0, LV  diastole 4.7, LV systole 4.5.   1. Technically adequate echocardiographic study.  2. Normal left atrium, right atrium, and right ventricle.  3. Normal mitral valve; mild annular calcification.  4. Minimal aortic valve sclerosis; mild annular calcification.  5. Normal tricuspid valve; trivial regurgitation.  6. Normal pulmonic valve.  7. Normal IVC.  8. Normal internal dimension, wall thickness, regional and global function     of the left ventricle.  9. Comparison with prior study of July 14, 2003:  No significant interval     change.      ___________________________________________                                            [place] Bing, M.D.   RR/MEDQ  D:  10/02/2003  T:  10/02/2003  Job:  [id number]   cc:   Ladona Horns. Neijstrom, MD  [address]  [place]  [place] [address]  Fax: [phone]

## 2010-08-23 NOTE — Op Note (Signed)
NAME:  William Joseph, William Joseph        ACCOUNT NO.:  [id number]   MEDICAL RECORD NO.:  [id number]          PATIENT TYPE:  AMB   LOCATION:  DAY                           FACILITY:  [facility]   PHYSICIAN:  Lionel December, M.D.    DATE OF BIRTH:  1945/12/08   DATE OF PROCEDURE:  03/01/2004  DATE OF DISCHARGE:                                 OPERATIVE REPORT   PROCEDURE:  Esophagogastroduodenoscopy.   INDICATIONS:  William Joseph is a 65 year old African-American male with history of non-  Hodgkin's lymphoma, who is status post chemotherapy.  He recently had PET  scan, which revealed decreased uptake/activity in the retroperitoneal lymph  nodes; however, there was markedly increased activity in the stomach.  He  then had an abdominal CT, which shows diffuse thickening to stomach at  gastric body and fundus.  It showed decrease in the size of lymph nodes  compared to previous study.  He is therefore undergoing diagnostic study.  Lately he has had symptoms of dyspepsia responding to Zantac.   The procedure risks were reviewed with the patient, and informed consent was  obtained.   PREMEDICATION:  Cetacaine spray for pharyngeal topical anesthesia, Demerol  50 mg IV, Versed 6 mg IV in divided dose.   FINDINGS:  Procedure performed in endoscopy suite.  The patient's vital  signs and O2 saturation were monitored during the procedure and remained  stable.  The patient was placed in the left lateral recumbent position and  the Olympus video scope was passed via oropharynx without any difficulty to  esophagus.   Esophagus:  Mucosa of the esophagus was normal.  There was a small focal  varix at 34 cm from the incisors, which was felt to be insignificant and not  indicative of portal hypertension.  The GE junction was at 43 cm from the  incisors.   Stomach:  It was empty and distended very well with insufflation.  Folds of  the proximal stomach were normal.  Examination of the mucosa revealed antral  erythema  and granularity.  The pyloric channel was patent.  Angularis,  fundus, and cardia were examined by retroflexing the scope and were normal.   Duodenum:  Examination of the bulb revealed a small bulbar ulcer with two  erosions extending into.  Mucosa and folds of postbulbar duodenum were  normal.   On the way out, a biopsy was taken of this mucosa at gastric body for  routine histology.  The endoscope was withdrawn.  The patient tolerated the  procedure well.   FINAL DIAGNOSES:  1.  No endoscopic evidence of gastric malignancy.  2.  Nonerosive gastritis.  Biopsy taken for routine histology from proximal      stomach.  3.  Bulbar ulcer.   RECOMMENDATIONS:  1.  H. pylori serology will be checked.  2.  Aciphex 20 mg p.o. q.a.m.  3.  I will be contacting the patient with the results of pending studies,      which also would be forwarded to Dr. Mariel Sleet.     Naje   NR/MEDQ  D:  03/01/2004  T:  03/01/2004  Job:  [id number]  cc:   Ladona Horns. Neijstrom, MD  [address]  [place]  [place] [address]  Fax: [phone]

## 2010-08-23 NOTE — H&P (Signed)
NAME:  William Joseph, William Joseph                    ACCOUNT NO.:  [id number]   MEDICAL RECORD NO.:  [id number]                   PATIENT TYPE:  INP   LOCATION:  A341                                 FACILITY:  [facility]   PHYSICIAN:  Hanley Hays. Dechurch, M.D.           DATE OF BIRTH:  01/15/1946   DATE OF ADMISSION:  05/15/2003  DATE OF DISCHARGE:                                HISTORY & PHYSICAL   HISTORY OF PRESENT ILLNESS:  This is a 65 year old African American  gentleman with no primary physician who was discharged on May 12, 2003  after a ten day hospital stay for abdominal pain, during which the diagnosis  of non-Hodgkin's lymphoma was made by biopsy.  He had significant ongoing  abdominal pain.  He was discharged to home for the weekend to come back to  the hospital for inpatient chemotherapy.  He is scheduled to begin  chemotherapy today under the direction of the oncology clinic and Dr.  Mariel Sleet.  The patient actually feels pretty well.  He is still having some  abdominal pain.  No stool since Friday and some bloating.  No nausea.  The  surgical incision is healing well.  He has no new complaints.   DISCHARGE MEDICATIONS:  1. Allopurinol 100 mg daily.  2. Percocet 1-2 q.4h. p.r.n. pain.   ALLERGIES:  None known.   HISTORY AND PHYSICAL:  The reader is referred to the H&P from May 02, 2003 regarding past medical history, surgical history, etc.  He does have a  history of peptic ulcer disease.   PHYSICAL EXAMINATION:  GENERAL:  Reveals a thin well-developed well-  nourished alert and appropriate gentleman in no distress.  LUNGS:  Clear to auscultation although diminished.  HEART:  Regular rate and rhythm.  No murmurs, rubs, or gallops.  ABDOMEN:  Protuberant and soft.  Slight tenderness in the right lower  quadrant.  The surgical sight is well-healed.  Bowel sounds are positive.  EXTREMITIES:  Without clubbing or cyanosis.  No edema is present.  SKIN:  Without rash,  lesion, or breakdown.  NEUROLOGIC:  Intact.   ASSESSMENT/PLAN:  Lymphoma, nonHodgkin's lymphoma.  Further studies are  pending as there is some question of whether it is a mantle cell, although  it is felt that he should begin chemotherapy with the regimen as outlined by  Dr. Mariel Sleet, which should significantly help with his pain control.  We  will continue his intravenous  fluids and his allopurinol.  I am going to empirically put him on Protonix,  which I thought he was on at the time of discharge and he is to be given  Dilaudid PCA, which has been very effective for him as far as his pain.  The  patient is well aware of the plan.    ___________________________________________  Hanley Hays Josefine Class, M.D.   FED/MEDQ  D:  05/16/2003  T:  05/16/2003  Job:  [id number]

## 2010-10-04 ENCOUNTER — Encounter (HOSPITAL_COMMUNITY): Payer: Federal, State, Local not specified - PPO | Attending: Oncology

## 2010-10-04 ENCOUNTER — Other Ambulatory Visit (HOSPITAL_COMMUNITY): Payer: Self-pay | Admitting: Oncology

## 2010-10-04 DIAGNOSIS — C8589 Other specified types of non-Hodgkin lymphoma, extranodal and solid organ sites: Secondary | ICD-10-CM | POA: Insufficient documentation

## 2010-10-04 LAB — COMPREHENSIVE METABOLIC PANEL
ALT: 15 U/L (ref 0–53)
AST: 16 U/L (ref 0–37)
Calcium: 9.4 mg/dL (ref 8.4–10.5)
GFR calc Af Amer: >60 mL/min (ref >60)
Glucose, Bld: 130 mg/dL — ABNORMAL HIGH (ref 70–99)
Sodium: 138 mEq/L (ref 135–145)
Total Protein: 7.2 g/dL (ref 6.0–8.3)

## 2010-10-04 LAB — DIFFERENTIAL
Basophils Relative: 0 % (ref 0–1)
Eosinophils Absolute: 0.1 10*3/uL (ref 0.0–0.7)
Eosinophils Relative: 3 % (ref 0–5)
Monocytes Absolute: 0.5 10*3/uL (ref 0.1–1.0)
Monocytes Relative: 9 % (ref 3–12)

## 2010-10-04 LAB — CBC
MCH: 30.1 pg (ref 26.0–34.0)
MCHC: 31.9 g/dL (ref 30.0–36.0)
Platelets: 175 10*3/uL (ref 150–400)

## 2010-10-07 ENCOUNTER — Encounter (HOSPITAL_COMMUNITY): Payer: Federal, State, Local not specified - PPO | Attending: Oncology | Admitting: Oncology

## 2010-10-07 DIAGNOSIS — C8583 Other specified types of non-Hodgkin lymphoma, intra-abdominal lymph nodes: Secondary | ICD-10-CM

## 2010-10-07 DIAGNOSIS — C8589 Other specified types of non-Hodgkin lymphoma, extranodal and solid organ sites: Secondary | ICD-10-CM | POA: Insufficient documentation

## 2010-10-15 ENCOUNTER — Encounter: Payer: Self-pay | Admitting: Adult Health

## 2010-10-22 ENCOUNTER — Other Ambulatory Visit (HOSPITAL_COMMUNITY): Payer: Self-pay | Admitting: Oncology

## 2010-10-22 DIAGNOSIS — F32A Depression, unspecified: Secondary | ICD-10-CM

## 2010-10-22 DIAGNOSIS — F329 Major depressive disorder, single episode, unspecified: Secondary | ICD-10-CM

## 2010-10-22 DIAGNOSIS — F411 Generalized anxiety disorder: Secondary | ICD-10-CM

## 2010-10-22 MED ORDER — ESCITALOPRAM OXALATE 10 MG PO TABS: ORAL_TABLET | ORAL | Status: DC

## 2010-12-31 LAB — CBC
MCV: 92.4
RBC: 4.18 — ABNORMAL LOW
WBC: 5.1

## 2010-12-31 LAB — DIFFERENTIAL
Basophils Absolute: 0
Basophils Relative: 1
Eosinophils Absolute: 0.2
Eosinophils Relative: 4
Lymphocytes Relative: 46

## 2010-12-31 LAB — COMPREHENSIVE METABOLIC PANEL
ALT: 17
AST: 18
Alkaline Phosphatase: 49
CO2: 27
Chloride: 105
Creatinine, Ser: 0.94
GFR calc Af Amer: >60
GFR calc non Af Amer: >60
Potassium: 4.1
Total Bilirubin: 0.7

## 2010-12-31 LAB — SEDIMENTATION RATE: Sed Rate: 12

## 2011-01-10 LAB — TESTOSTERONE: Testosterone: 430.51 (ref 350–890)

## 2011-01-15 LAB — COMPREHENSIVE METABOLIC PANEL
ALT: 13
AST: 18
Albumin: 3.8
CO2: 27
Calcium: 9.5
Chloride: 106
GFR calc Af Amer: >60
GFR calc non Af Amer: >60
Sodium: 139

## 2011-01-15 LAB — SEDIMENTATION RATE: Sed Rate: 12

## 2011-01-15 LAB — CBC
MCHC: 32.9
Platelets: 184
RBC: 4.38
WBC: 5.1

## 2011-01-15 LAB — DIFFERENTIAL
Eosinophils Absolute: 0.2
Eosinophils Relative: 4
Lymphs Abs: 2.2
Monocytes Absolute: 0.3
Monocytes Relative: 6

## 2011-03-01 ENCOUNTER — Other Ambulatory Visit (HOSPITAL_COMMUNITY): Payer: Self-pay | Admitting: Oncology

## 2011-03-13 ENCOUNTER — Ambulatory Visit (INDEPENDENT_AMBULATORY_CARE_PROVIDER_SITE_OTHER): Payer: Federal, State, Local not specified - PPO | Admitting: Otolaryngology

## 2011-03-13 DIAGNOSIS — H60339 Swimmer's ear, unspecified ear: Secondary | ICD-10-CM

## 2011-03-13 DIAGNOSIS — H903 Sensorineural hearing loss, bilateral: Secondary | ICD-10-CM

## 2011-03-13 DIAGNOSIS — H902 Conductive hearing loss, unspecified: Secondary | ICD-10-CM

## 2011-03-26 ENCOUNTER — Other Ambulatory Visit (HOSPITAL_COMMUNITY): Payer: Self-pay | Admitting: Oncology

## 2011-03-27 ENCOUNTER — Ambulatory Visit (INDEPENDENT_AMBULATORY_CARE_PROVIDER_SITE_OTHER): Payer: Federal, State, Local not specified - PPO | Admitting: Otolaryngology

## 2011-03-27 DIAGNOSIS — H73009 Acute myringitis, unspecified ear: Secondary | ICD-10-CM

## 2011-03-27 DIAGNOSIS — H60339 Swimmer's ear, unspecified ear: Secondary | ICD-10-CM

## 2011-04-09 ENCOUNTER — Encounter (HOSPITAL_COMMUNITY): Payer: Federal, State, Local not specified - PPO | Attending: Oncology

## 2011-04-09 DIAGNOSIS — N400 Enlarged prostate without lower urinary tract symptoms: Secondary | ICD-10-CM | POA: Insufficient documentation

## 2011-04-09 DIAGNOSIS — F341 Dysthymic disorder: Secondary | ICD-10-CM | POA: Insufficient documentation

## 2011-04-09 DIAGNOSIS — Z87898 Personal history of other specified conditions: Secondary | ICD-10-CM | POA: Insufficient documentation

## 2011-04-09 DIAGNOSIS — Z09 Encounter for follow-up examination after completed treatment for conditions other than malignant neoplasm: Secondary | ICD-10-CM | POA: Insufficient documentation

## 2011-04-09 DIAGNOSIS — J449 Chronic obstructive pulmonary disease, unspecified: Secondary | ICD-10-CM | POA: Insufficient documentation

## 2011-04-09 DIAGNOSIS — J4489 Other specified chronic obstructive pulmonary disease: Secondary | ICD-10-CM | POA: Insufficient documentation

## 2011-04-09 DIAGNOSIS — C8583 Other specified types of non-Hodgkin lymphoma, intra-abdominal lymph nodes: Secondary | ICD-10-CM

## 2011-04-09 DIAGNOSIS — J3489 Other specified disorders of nose and nasal sinuses: Secondary | ICD-10-CM | POA: Insufficient documentation

## 2011-04-09 LAB — COMPREHENSIVE METABOLIC PANEL
BUN: 14 mg/dL (ref 6–23)
CO2: 30 mEq/L (ref 19–32)
Calcium: 9.9 mg/dL (ref 8.4–10.5)
Creatinine, Ser: 1.01 mg/dL (ref 0.50–1.35)
GFR calc Af Amer: 89 mL/min — ABNORMAL LOW (ref >90)
GFR calc non Af Amer: 77 mL/min — ABNORMAL LOW (ref >90)
Glucose, Bld: 108 mg/dL — ABNORMAL HIGH (ref 70–99)
Total Protein: 7.3 g/dL (ref 6.0–8.3)

## 2011-04-09 LAB — DIFFERENTIAL
Basophils Absolute: 0 10*3/uL (ref 0.0–0.1)
Lymphocytes Relative: 43 % (ref 12–46)
Lymphs Abs: 2.4 10*3/uL (ref 0.7–4.0)
Neutrophils Relative %: 45 % (ref 43–77)

## 2011-04-09 LAB — CBC
HCT: 40 % (ref 39.0–52.0)
Hemoglobin: 12.8 g/dL — ABNORMAL LOW (ref 13.0–17.0)
MCH: 29.9 pg (ref 26.0–34.0)
MCHC: 32 g/dL (ref 30.0–36.0)
MCV: 93.5 fL (ref 78.0–100.0)
RBC: 4.28 MIL/uL (ref 4.22–5.81)

## 2011-04-09 LAB — SEDIMENTATION RATE: Sed Rate: 8 mm/hr (ref 0–16)

## 2011-04-09 LAB — LACTATE DEHYDROGENASE: LDH: 147 U/L (ref 94–250)

## 2011-04-09 NOTE — Progress Notes (Signed)
Labs drawn today for cbc/diff,cmp,ldh,esr

## 2011-04-10 ENCOUNTER — Telehealth (HOSPITAL_COMMUNITY): Payer: Self-pay | Admitting: *Deleted

## 2011-04-10 NOTE — Telephone Encounter (Signed)
Message left as per below.

## 2011-04-10 NOTE — Telephone Encounter (Signed)
Message copied by Dennie Maizes on Thu Apr 10, 2011  9:27 AM ------      Message from: Mariel Sleet, ERIC S      Created: Wed Apr 09, 2011 11:46 AM       Labs very stable-call him

## 2011-04-14 ENCOUNTER — Encounter (HOSPITAL_COMMUNITY): Payer: Self-pay | Admitting: Oncology

## 2011-04-14 ENCOUNTER — Encounter (HOSPITAL_BASED_OUTPATIENT_CLINIC_OR_DEPARTMENT_OTHER): Payer: Federal, State, Local not specified - PPO | Admitting: Oncology

## 2011-04-14 VITALS — BP 138/83 | HR 71 | Temp 98.4°F | Ht 74.0 in | Wt 227.4 lb

## 2011-04-14 DIAGNOSIS — J3489 Other specified disorders of nose and nasal sinuses: Secondary | ICD-10-CM

## 2011-04-14 DIAGNOSIS — J449 Chronic obstructive pulmonary disease, unspecified: Secondary | ICD-10-CM

## 2011-04-14 DIAGNOSIS — R0981 Nasal congestion: Secondary | ICD-10-CM

## 2011-04-14 DIAGNOSIS — C8589 Other specified types of non-Hodgkin lymphoma, extranodal and solid organ sites: Secondary | ICD-10-CM

## 2011-04-14 DIAGNOSIS — Z23 Encounter for immunization: Secondary | ICD-10-CM

## 2011-04-14 MED ORDER — INFLUENZA VIRUS VACC SPLIT PF IM SUSP
0.5000 mL | INTRAMUSCULAR | Status: AC
  Administered 2011-04-14: 0.5 mL via INTRAMUSCULAR

## 2011-04-14 MED ORDER — INFLUENZA VIRUS VACC SPLIT PF IM SUSP
INTRAMUSCULAR | Status: AC
  Administered 2011-04-14: 0.5 mL via INTRAMUSCULAR
  Filled 2011-04-14: qty 0.5

## 2011-04-14 MED ORDER — PNEUMOCOCCAL VAC POLYVALENT 25 MCG/0.5ML IJ INJ
0.5000 mL | INJECTION | INTRAMUSCULAR | Status: AC
  Administered 2011-04-14: 0.5 mL via INTRAMUSCULAR
  Filled 2011-04-14: qty 0.5

## 2011-04-14 MED ORDER — FLUTICASONE PROPIONATE 50 MCG/ACT NA SUSP: 2.0000 | Freq: Every day | NASAL | Status: DC

## 2011-04-14 NOTE — Patient Instructions (Signed)
[facility]  Discharge Instructions  RECOMMENDATIONS MADE BY THE CONSULTANT AND ANY TEST RESULTS WILL BE SENT TO YOUR REFERRING DOCTOR.   EXAM FINDINGS BY MD TODAY AND SIGNS AND SYMPTOMS TO REPORT TO CLINIC OR PRIMARY MD: Discussed brain foods--greens,nuts, eat frozen vegetables vs. Canned.  MEDICATIONS PRESCRIBED: flonase called to [facility]   INSTRUCTIONS GIVEN AND DISCUSSED: Exercise your brain.    SPECIAL INSTRUCTIONS/FOLLOW-UP: Lab work Needed  In 6 months   I acknowledge that I have been informed and understand all the instructions given to me and received a copy. I do not have any more questions at this time, but understand that I may call the Specialty Clinic at [facility] at [phone] during business hours should I have any further questions or need assistance in obtaining follow-up care.    __________________________________________  _____________  __________ Signature of Patient or Authorized Representative            Date                   Time    __________________________________________ Nurse's Signature

## 2011-04-14 NOTE — Progress Notes (Signed)
CC:   Edward L. Juanetta Gosling, M.D. Lionel December, M.D. Bertram Millard. Dahlstedt, M.D.  DIAGNOSES: 1. Stage IIA diffuse large B-cell lymphoma, presenting January 2005     with severe abdominal pain, requiring admission.  His workup at     that time included a bone marrow aspiration and biopsy, CT scans     and a PET scan,  which revealed stage IIA disease.  There was a     question of his histology being mantle cell, but it turned out to     be the above.  We initiated therapy with EPOCH, then switched to R-     CHOP for 6 cycles, and he was in a CR after the 2nd cycle and     remains.  His last PET scan was in December 2011, which was     perfect.  Labs remain stable.  We are not going to repeat a PET     scan unless there is an issue. 2. Chronic obstructive pulmonary disease with possible sleep apnea     syndrome, diagnosed by Dr. Juanetta Gosling. 3. Nasal congestion, but it is without infection.  I will put him on     Flonase nasal spray for 2 to 4 weeks and see if he is any better. 4. Obesity, weighing 227 pounds on a 6 foot 2 inch frame, with a BMI     just at 30 approximately. 5. Mild memory loss, stable. 6. Benign prostatic hypertrophy.  He is on now Cialis 5 mg a day, and     Avodart, as well as Flomax, and he states he is urinating better     since starting the Cialis. 7. History of smoking 1 to 2 packs a day for many, many years, though     he has quit. 8. Depression and anxiety in the past, and I think he is still     somewhat anxious at times. [place] has no B symptomatology though.  His labs are extremely stable. Sedimentation rate is only 8.  He looks good, feels good.  His memory is still part of an issue for him, plus I think he gets little bit down at times and does not want to get up and move about and get some exercise, which I think is important for him.  He was on a very good diet; he has sort of backed off of that, not doing as well with that  He has no new complaints  today.  PHYSICAL EXAMINATION:  He is afebrile, not in any pain; he is in no acute distress.  Pulse 72 and regular, respirations 16 and unlabored. His weight is mentioned above.  He is 6 feet 2 inches though.  He has no lymphadenopathy in any location, including cervical, supraclavicular, infraclavicular, axillary or inguinal areas.  Abdomen is soft and nontender without organomegaly.  Lungs are clear to auscultation and percussion.  Heart shows a regular rhythm and rate without murmur, rub or gallop.  He has no obvious thyromegaly.  PLAN:  We will see him back in 6 months.  Sooner if need be.  We will continue to monitor him with physical exams and lab work only, unless things change.    ______________________________ Ladona Horns. Mariel Sleet, MD ESN/MEDQ  D:  04/14/2011  T:  04/14/2011  Job:  [id number]

## 2011-04-14 NOTE — Progress Notes (Signed)
This office note has been dictated.

## 2011-04-14 NOTE — Progress Notes (Signed)
William Joseph presents today for injection per MD orders. pneumo-coccal vaccine  administered IM in right Upper Arm. Flu vaccine .5 cc IM in left deltoid. Administration without incident. Patient tolerated well.

## 2011-04-24 ENCOUNTER — Ambulatory Visit (INDEPENDENT_AMBULATORY_CARE_PROVIDER_SITE_OTHER): Payer: Federal, State, Local not specified - PPO | Admitting: Otolaryngology

## 2011-04-24 DIAGNOSIS — H73009 Acute myringitis, unspecified ear: Secondary | ICD-10-CM

## 2011-04-24 DIAGNOSIS — H60339 Swimmer's ear, unspecified ear: Secondary | ICD-10-CM

## 2011-04-29 ENCOUNTER — Other Ambulatory Visit (HOSPITAL_COMMUNITY): Payer: Self-pay | Admitting: Oncology

## 2011-05-29 ENCOUNTER — Other Ambulatory Visit (HOSPITAL_COMMUNITY): Payer: Self-pay | Admitting: Oncology

## 2011-06-27 ENCOUNTER — Other Ambulatory Visit (HOSPITAL_COMMUNITY): Payer: Self-pay | Admitting: Oncology

## 2011-08-01 ENCOUNTER — Other Ambulatory Visit (HOSPITAL_COMMUNITY): Payer: Self-pay | Admitting: Oncology

## 2011-08-28 ENCOUNTER — Other Ambulatory Visit (HOSPITAL_COMMUNITY): Payer: Self-pay | Admitting: Pulmonary Disease

## 2011-08-28 DIAGNOSIS — R4182 Altered mental status, unspecified: Secondary | ICD-10-CM

## 2011-09-02 ENCOUNTER — Ambulatory Visit (HOSPITAL_COMMUNITY)
Admission: RE | Admit: 2011-09-02 | Discharge: 2011-09-02 | Disposition: A | Discharge status: Home / Self Care | Payer: Federal, State, Local not specified - PPO | Source: Ambulatory Visit | Attending: Pulmonary Disease | Admitting: Pulmonary Disease

## 2011-09-02 ENCOUNTER — Other Ambulatory Visit (HOSPITAL_COMMUNITY): Payer: Self-pay | Admitting: Oncology

## 2011-09-02 DIAGNOSIS — R51 Headache: Secondary | ICD-10-CM | POA: Insufficient documentation

## 2011-09-02 DIAGNOSIS — J329 Chronic sinusitis, unspecified: Secondary | ICD-10-CM | POA: Insufficient documentation

## 2011-09-02 DIAGNOSIS — R4182 Altered mental status, unspecified: Secondary | ICD-10-CM

## 2011-10-03 ENCOUNTER — Other Ambulatory Visit (HOSPITAL_COMMUNITY): Payer: Self-pay | Admitting: Oncology

## 2011-10-13 ENCOUNTER — Encounter (HOSPITAL_COMMUNITY): Payer: Federal, State, Local not specified - PPO | Attending: Oncology

## 2011-10-13 DIAGNOSIS — J4489 Other specified chronic obstructive pulmonary disease: Secondary | ICD-10-CM | POA: Insufficient documentation

## 2011-10-13 DIAGNOSIS — C8589 Other specified types of non-Hodgkin lymphoma, extranodal and solid organ sites: Secondary | ICD-10-CM

## 2011-10-13 DIAGNOSIS — Z09 Encounter for follow-up examination after completed treatment for conditions other than malignant neoplasm: Secondary | ICD-10-CM | POA: Insufficient documentation

## 2011-10-13 DIAGNOSIS — J449 Chronic obstructive pulmonary disease, unspecified: Secondary | ICD-10-CM | POA: Insufficient documentation

## 2011-10-13 DIAGNOSIS — Z87898 Personal history of other specified conditions: Secondary | ICD-10-CM | POA: Insufficient documentation

## 2011-10-13 DIAGNOSIS — E669 Obesity, unspecified: Secondary | ICD-10-CM | POA: Insufficient documentation

## 2011-10-13 DIAGNOSIS — F341 Dysthymic disorder: Secondary | ICD-10-CM | POA: Insufficient documentation

## 2011-10-13 DIAGNOSIS — N400 Enlarged prostate without lower urinary tract symptoms: Secondary | ICD-10-CM | POA: Insufficient documentation

## 2011-10-13 LAB — CBC
Hemoglobin: 12.9 g/dL — ABNORMAL LOW (ref 13.0–17.0)
MCH: 29.4 pg (ref 26.0–34.0)
MCHC: 31.7 g/dL (ref 30.0–36.0)
Platelets: 172 10*3/uL (ref 150–400)
RBC: 4.39 MIL/uL (ref 4.22–5.81)

## 2011-10-13 LAB — COMPREHENSIVE METABOLIC PANEL
ALT: 16 U/L (ref 0–53)
AST: 15 U/L (ref 0–37)
Albumin: 3.9 g/dL (ref 3.5–5.2)
Alkaline Phosphatase: 53 U/L (ref 39–117)
BUN: 13 mg/dL (ref 6–23)
Chloride: 106 mEq/L (ref 96–112)
Potassium: 4 mEq/L (ref 3.5–5.1)
Sodium: 141 mEq/L (ref 135–145)
Total Bilirubin: 0.3 mg/dL (ref 0.3–1.2)
Total Protein: 7.2 g/dL (ref 6.0–8.3)

## 2011-10-13 LAB — DIFFERENTIAL
Basophils Relative: 0 % (ref 0–1)
Eosinophils Absolute: 0.1 10*3/uL (ref 0.0–0.7)
Lymphs Abs: 2.3 10*3/uL (ref 0.7–4.0)
Monocytes Relative: 8 % (ref 3–12)
Neutro Abs: 2 10*3/uL (ref 1.7–7.7)
Neutrophils Relative %: 41 % — ABNORMAL LOW (ref 43–77)

## 2011-10-13 NOTE — Progress Notes (Signed)
Labs drawn today for cmp,ldh,sed rate,cbc/diff

## 2011-10-15 ENCOUNTER — Encounter (HOSPITAL_BASED_OUTPATIENT_CLINIC_OR_DEPARTMENT_OTHER): Payer: Federal, State, Local not specified - PPO | Admitting: Oncology

## 2011-10-15 VITALS — BP 156/95 | HR 75 | Temp 98.4°F | Ht 74.0 in | Wt 229.0 lb

## 2011-10-15 DIAGNOSIS — F341 Dysthymic disorder: Secondary | ICD-10-CM

## 2011-10-15 DIAGNOSIS — J449 Chronic obstructive pulmonary disease, unspecified: Secondary | ICD-10-CM

## 2011-10-15 DIAGNOSIS — I10 Essential (primary) hypertension: Secondary | ICD-10-CM

## 2011-10-15 DIAGNOSIS — C8589 Other specified types of non-Hodgkin lymphoma, extranodal and solid organ sites: Secondary | ICD-10-CM

## 2011-10-15 NOTE — Patient Instructions (Addendum)
William Joseph  [id number] Apr 18, 1945 Dr. Glenford Peers   [facility]  Discharge Instructions  RECOMMENDATIONS MADE BY THE CONSULTANT AND ANY TEST RESULTS WILL BE SENT TO YOUR REFERRING DOCTOR.   EXAM FINDINGS BY MD TODAY AND SIGNS AND SYMPTOMS TO REPORT TO CLINIC OR PRIMARY MD: exam and discussion per MD.  You are doing extremely well.  MEDICATIONS PRESCRIBED: none   INSTRUCTIONS GIVEN AND DISCUSSED: Other :  Report night sweats, shortness of breath, any new lumps, recurring infections, etc.  SPECIAL INSTRUCTIONS/FOLLOW-UP: Lab work Needed in 6 months and Return to Clinic in 6 months to see Dr. Mariel Sleet.   I acknowledge that I have been informed and understand all the instructions given to me and received a copy. I do not have any more questions at this time, but understand that I may call the Specialty Clinic at [facility] at [phone] during business hours should I have any further questions or need assistance in obtaining follow-up care.    __________________________________________  _____________  __________ Signature of Patient or Authorized Representative            Date                   Time    __________________________________________ Nurse's Signature

## 2011-10-15 NOTE — Progress Notes (Signed)
Problem #1 stage IIA diffuse large B-cell lymphoma presenting in January 2005 with severe abdominal pain requiring admission to this hospital. Workup at that time included bone marrow aspirate and biopsy CT scans and a PET scan which revealed stage IIA disease. He was treated with [facility] initially do to the possibility of this being mantle cell but it turned out to be the above. We then switched him to R. CHOP for 6 cycles of chemotherapy total. He was in a CR after his second cycle. He remains disease free at this time. His last PET scan was in December 2011 and will not repeat that must thinks change clinically.  Problem #2 COPD with possible early sleep apnea diagnosed by Dr. Juanetta Gosling  Problem #4 obesity weighing 229 pounds on a 6 foot 2 inch frame and he and his wife are going to try to lose weight by changing diet predominantly quantity of food taken in in addition to some exercise.  Problem 5 BPH  Problem #6 depression and anxiety after his diagnosis and treatment which is resolving very nicely now.  He is here today with his wife and doing well. I have not seen in this animated in the entire time I've known him. He is appropriate he is interested in life he is active in his work and at [employer]. He is still taking his naps in the afternoon but is much more active. His wife also feel very good about his change in interest around the house. He looks great today and remains free of B. symptoms.  Vital signs are recorded in the chart. He has no lymphadenopathy. He is alert and oriented. Skin is unremarkable. Still has a pigmented birthmark over his right anterior shoulder chest area. Lungs though are clear. Heart shows a regular rhythm and rate without murmur rub or gallop. Abdomen is soft nontender without again in [place] masses bowel sounds are normal. No peripheral edema. His old Port-A-Cath site is intact. He does have diminished breath sounds but his lungs are perfectly clear.  His labs today were  excellent except for a minimal elevation of his glucose. We will see him in 6 months with laboratory work history and physical exam and continue to hold on CAT scans unless there needed in the future

## 2011-11-25 ENCOUNTER — Encounter (HOSPITAL_COMMUNITY): Payer: Federal, State, Local not specified - PPO | Attending: Oncology | Admitting: Oncology

## 2011-11-25 VITALS — BP 138/93 | HR 67 | Temp 98.0°F | Resp 18 | Ht 75.0 in | Wt 229.8 lb

## 2011-11-25 DIAGNOSIS — C8589 Other specified types of non-Hodgkin lymphoma, extranodal and solid organ sites: Secondary | ICD-10-CM | POA: Insufficient documentation

## 2011-11-25 DIAGNOSIS — R599 Enlarged lymph nodes, unspecified: Secondary | ICD-10-CM

## 2011-11-25 LAB — CBC WITH DIFFERENTIAL/PLATELET
Eosinophils Absolute: 0.2 10*3/uL (ref 0.0–0.7)
Eosinophils Relative: 3 % (ref 0–5)
HCT: 40.9 % (ref 39.0–52.0)
Hemoglobin: 13 g/dL (ref 13.0–17.0)
Lymphocytes Relative: 41 % (ref 12–46)
Lymphs Abs: 2.1 10*3/uL (ref 0.7–4.0)
MCH: 29.5 pg (ref 26.0–34.0)
MCV: 92.7 fL (ref 78.0–100.0)
Monocytes Absolute: 0.3 10*3/uL (ref 0.1–1.0)
Monocytes Relative: 7 % (ref 3–12)
RBC: 4.41 MIL/uL (ref 4.22–5.81)

## 2011-11-25 MED ORDER — CEPHALEXIN 500 MG PO CAPS: 500.0000 mg | ORAL_CAPSULE | Freq: Three times a day (TID) | ORAL | Status: AC

## 2011-11-25 NOTE — Progress Notes (Signed)
The patient is here today after palpating a lymph node in the right neck several days ago. He noticed last week that there was some tenderness to his right neck and he felt a lump and wanted to be seen. He has not had fever or chills but his wife states he has not felt as well the last several weeks. It's been nonspecific. He did have a tooth implant earlier in the year and a tooth was attached' one month ago and then this neck tenderness started about 7-14 days ago. He states the lump has gone down in size since last week. It is still slightly tender.  He has no fever today and is in no acute distress. His lungs are clear. Heart shows a regular rhythm and rate without murmur rub gallop. Abdomen is soft and nontender without hepatosplenomegaly. He has no adenopathy and except in the right neck where he has a 1 cm fairly soft lymph node and slightly tender in the right anterior cervical chain. There are no lymph nodes anywhere else including cervical supraclavicular infraclavicular axillary epitrochlear or inguinal areas. His legs are without edema. He still has the birthmark over the right upper chest and shoulder area.  Upon more careful questioning his wife also mention that he is getting a little chest tightness when he goes up stairs. He has to slowdown take it easy and the chest tightness we'll go way. With his history of smoking a pack to a pack and half a day for many many years I would like him to see Dr. Diona Browner in cardiology.  From the standpoint of the lymph node I am concerned this might be from a low-grade infection from the gum/tooth area even though his gums do not look infected. He states he rarely has a twinge so in the right posterior portion of the jaw mandibular area. So I am going to give him Keflex 500 mg 3 times a day for 14 days and see him back here in 4 weeks. I will get a CBC and differential today we will get him a cardiology consultation soon.

## 2011-11-25 NOTE — Patient Instructions (Signed)
[facility]  Discharge Instructions William Joseph  DOB [birth year] CSN [id number]  MRN [id number]   RECOMMENDATIONS MADE BY THE CONSULTANT AND ANY TEST RESULTS WILL BE SENT TO YOUR REFERRING DOCTOR.   EXAM FINDINGS BY MD TODAY AND SIGNS AND SYMPTOMS TO REPORT TO CLINIC OR PRIMARY MD: the node may be related to your tooth implant. Could just be irritation or low level infection.  MEDICATIONS PRESCRIBED: keflex 500 mg 1 tablet 3 x a day for 2 weeks   INSTRUCTIONS GIVEN AND DISCUSSED: See [place] back in 4 weeks  SPECIAL INSTRUCTIONS/FOLLOW-UP: We want you to see Simona Huh in cardiology.   I acknowledge that I have been informed and understand all the instructions given to me and received a copy. I do not have any more questions at this time, but understand that I may call the Specialty Clinic at [facility] at [phone] during business hours should I have any further questions or need assistance in obtaining follow-up care.    __________________________________________  _____________  __________ Signature of Patient or Authorized Representative            Date                   Time    __________________________________________ Nurse's Signature

## 2011-12-17 ENCOUNTER — Ambulatory Visit (INDEPENDENT_AMBULATORY_CARE_PROVIDER_SITE_OTHER): Payer: Federal, State, Local not specified - PPO | Admitting: Cardiology

## 2011-12-17 ENCOUNTER — Encounter: Payer: Self-pay | Admitting: Cardiology

## 2011-12-17 VITALS — BP 157/97 | HR 81 | Ht 74.0 in | Wt 226.0 lb

## 2011-12-17 DIAGNOSIS — R0602 Shortness of breath: Secondary | ICD-10-CM

## 2011-12-17 DIAGNOSIS — R002 Palpitations: Secondary | ICD-10-CM | POA: Insufficient documentation

## 2011-12-17 NOTE — Progress Notes (Signed)
Clinical Summary William Joseph is a 66 y.o.male referred for cardiology consultation by Dr. Mariel Sleet. I reviewed his cardiac history, including a normal cardiac catheterization 2 years ago. He is referred secondary to complaints of chest pain, however on questioning today, he denies any sense of chest pain, rather intermittent shortness of breath with activity, and a sense of palpitations with occasional dizziness. He has had no syncope. He feels these palpitations almost daily, and this has not changed much over the last several years. He states that this has been notable since undergoing chemotherapy back in 2005.  His ECG today is normal.  He has a documented history of obstructive sleep apnea, although has been intolerant to standard CPAP therapies.   No Known Allergies  Current Outpatient Prescriptions  Medication Sig Dispense Refill  . Aspirin-Caffeine (BC FAST PAIN RELIEF PO) Take by mouth.        . dutasteride (AVODART) 0.5 MG capsule Take 0.5 mg by mouth daily.        . fexofenadine-pseudoephedrine (ALLEGRA-D) 60-120 MG per tablet Take 1 tablet by mouth as needed.       . Multiple Vitamins-Minerals (MULTIVITAMIN GUMMIES ADULT PO) Take by mouth daily.        . sodium chloride (OCEAN) 0.65 % nasal spray Place 1 spray into the nose as needed.      . tadalafil (CIALIS) 5 MG tablet Take 5 mg by mouth daily as needed.        . Tamsulosin HCl (FLOMAX) 0.4 MG CAPS Take 0.4 mg by mouth daily.          Past Medical History  Diagnosis Date  . Non Hodgkin's lymphoma   . Sleep apnea   . Anxiety   . Essential hypertension, benign   . History of cardiac catheterization     Normal coronaries March 2011  . Nonerosive nonspecific gastritis     EGD - Dr. Karilyn Cota    Past Surgical History  Procedure Date  . Tooth implant   . Port-a-cath placement   . Exploratory laparotomy   . Lymph node biopsy     Family History  Problem Relation Age of Onset  . Diabetes Father   . Coronary  artery disease Mother   . Hypertension Brother     Social History William Joseph reports that he has been smoking Cigarettes and Cigars.  He has a 1 pack-year smoking history. He has never used smokeless tobacco. William Joseph reports that he does not drink alcohol.  Review of Systems Reports chronic memory problems since undergoing chemotherapy. Has had no falls or syncope. Stable appetite. No reported bleeding episodes. No edema, orthopnea, PND. Otherwise negative.  Physical Examination Filed Vitals:   12/17/11 1337  BP: 157/97  Pulse: 81   Filed Weights   12/17/11 1337  Weight: 226 lb (102.513 kg)   Patient in no acute distress. HEENT: Conjunctiva and lids normal, oropharynx clear. Neck: Supple, no elevated JVP or carotid bruits, no thyromegaly. Lungs: Clear to auscultation, nonlabored breathing at rest. Cardiac: Regular rate and rhythm, no S3 or significant systolic murmur, S4 is noted, no pericardial rub. Abdomen: Soft, nontender, bowel sounds present, no guarding or rebound. Extremities: No pitting edema, distal pulses 2+. Skin: Warm and dry. Musculoskeletal: No kyphosis. Neuropsychiatric: Alert and oriented x3, affect grossly appropriate.   Problem List and Plan   Palpitations Reports experiencing this almost daily, occasionally with sensation of lightheadedness, but no syncope. He states that this is been a chronic problem. ECG today  is normal. He does have a history of sleep apnea, currently untreated, raising the possibility of paroxysmal cardiac arrhythmia. Has has a previous history of chemotherapy. Plan is to pursue an echocardiogram to better delineate cardiac structure and function, also a 48-hour Holter monitor. We will inform him of the results.  Shortness of breath Noted sometimes with activity. Cardiac structural workup including echocardiogram is planned. He had normal coronary arteries 2 years ago at cardiac catheterization.    Jonelle Sidle,  M.D., F.A.C.C.

## 2011-12-17 NOTE — Patient Instructions (Addendum)
Your physician has requested that you have an echocardiogram. Echocardiography is a painless test that uses sound waves to create images of your heart. It provides your doctor with information about the size and shape of your heart and how well your heart's chambers and valves are working. This procedure takes approximately one hour. There are no restrictions for this procedure. Your physician has recommended that you wear a 48 hour holter monitor. Holter monitors are medical devices that record the heart's electrical activity. Doctors most often use these monitors to diagnose arrhythmias. Arrhythmias are problems with the speed or rhythm of the heartbeat. The monitor is a small, portable device. You can wear one while you do your normal daily activities. This is usually used to diagnose what is causing palpitations/syncope (passing out). Your physician recommends that you continue on your current medications as directed. Please refer to the Current Medication list given to you today.  We will call you with your results.

## 2011-12-17 NOTE — Assessment & Plan Note (Signed)
Reports experiencing this almost daily, occasionally with sensation of lightheadedness, but no syncope. He states that this is been a chronic problem. ECG today is normal. He does have a history of sleep apnea, currently untreated, raising the possibility of paroxysmal cardiac arrhythmia. Has has a previous history of chemotherapy. Plan is to pursue an echocardiogram to better delineate cardiac structure and function, also a 48-hour Holter monitor. We will inform him of the results.

## 2011-12-17 NOTE — Assessment & Plan Note (Signed)
Noted sometimes with activity. Cardiac structural workup including echocardiogram is planned. He had normal coronary arteries 2 years ago at cardiac catheterization.

## 2011-12-24 ENCOUNTER — Encounter (HOSPITAL_COMMUNITY): Payer: Federal, State, Local not specified - PPO | Attending: Oncology | Admitting: Oncology

## 2011-12-24 ENCOUNTER — Other Ambulatory Visit: Payer: Federal, State, Local not specified - PPO

## 2011-12-24 ENCOUNTER — Encounter: Payer: Self-pay | Admitting: Physician Assistant

## 2011-12-24 ENCOUNTER — Encounter (HOSPITAL_COMMUNITY): Payer: Self-pay | Admitting: Oncology

## 2011-12-24 VITALS — BP 151/95 | HR 62 | Temp 98.2°F | Resp 18 | Wt 227.9 lb

## 2011-12-24 DIAGNOSIS — C8589 Other specified types of non-Hodgkin lymphoma, extranodal and solid organ sites: Secondary | ICD-10-CM | POA: Insufficient documentation

## 2011-12-24 DIAGNOSIS — R079 Chest pain, unspecified: Secondary | ICD-10-CM

## 2011-12-24 NOTE — Patient Instructions (Signed)
Pt notified to keep regularly scheduled appt.

## 2011-12-24 NOTE — Progress Notes (Signed)
The lymph node in the right neck has disappeared after antibiotic therapy. There are no nodes palpable now on either side of the neck, supraclavicular area or infraclavicular area or axillary areas. He is also slightly tender to the right of the sternum where I suspect he has pulled the pectoralis muscle working out in the garden with a shovel which he is not accustomed to doing. It started to bother him only a day or 2 after he worked in the garden extensively one day the goals. There is minimal tenderness there at approximately the fourth rib area next of the sternum. It is not better in 2 weeks he will let he know but I do not think this is anything to be alarmed about. Therefore since a lymph node in the neck is gone and his tooth and gums are not bothering him at all we will do see him back as planned in January

## 2011-12-25 DIAGNOSIS — R002 Palpitations: Secondary | ICD-10-CM

## 2012-01-01 ENCOUNTER — Other Ambulatory Visit (INDEPENDENT_AMBULATORY_CARE_PROVIDER_SITE_OTHER): Payer: Federal, State, Local not specified - PPO

## 2012-01-01 ENCOUNTER — Other Ambulatory Visit: Payer: Self-pay

## 2012-01-01 DIAGNOSIS — R002 Palpitations: Secondary | ICD-10-CM

## 2012-01-01 DIAGNOSIS — R0602 Shortness of breath: Secondary | ICD-10-CM

## 2012-01-02 ENCOUNTER — Telehealth: Payer: Self-pay | Admitting: *Deleted

## 2012-01-02 NOTE — Telephone Encounter (Signed)
Message copied by Lesle Chris on Fri Jan 02, 2012  8:57 AM ------      Message from: Jonelle Sidle      Created: Fri Jan 02, 2012  8:15 AM       Reviewed. LV function is normal. No major, clinically significant valvular abnormalities.This is a reassuring study.

## 2012-01-02 NOTE — Telephone Encounter (Signed)
Notes Recorded by Lesle Chris, LPN on 1/61/0960 at 8:57 AM Wife notified of results below.

## 2012-04-16 ENCOUNTER — Other Ambulatory Visit (HOSPITAL_COMMUNITY): Payer: Federal, State, Local not specified - PPO

## 2012-04-19 ENCOUNTER — Encounter (HOSPITAL_COMMUNITY): Payer: Federal, State, Local not specified - PPO | Attending: Oncology | Admitting: Oncology

## 2012-04-19 VITALS — BP 145/81 | HR 71 | Temp 97.6°F | Resp 20 | Wt 224.7 lb

## 2012-04-19 DIAGNOSIS — C8589 Other specified types of non-Hodgkin lymphoma, extranodal and solid organ sites: Secondary | ICD-10-CM | POA: Insufficient documentation

## 2012-04-19 DIAGNOSIS — J4489 Other specified chronic obstructive pulmonary disease: Secondary | ICD-10-CM | POA: Insufficient documentation

## 2012-04-19 DIAGNOSIS — F3289 Other specified depressive episodes: Secondary | ICD-10-CM | POA: Insufficient documentation

## 2012-04-19 DIAGNOSIS — F411 Generalized anxiety disorder: Secondary | ICD-10-CM | POA: Insufficient documentation

## 2012-04-19 DIAGNOSIS — J449 Chronic obstructive pulmonary disease, unspecified: Secondary | ICD-10-CM | POA: Insufficient documentation

## 2012-04-19 DIAGNOSIS — F329 Major depressive disorder, single episode, unspecified: Secondary | ICD-10-CM | POA: Insufficient documentation

## 2012-04-19 LAB — CBC
HCT: 39.9 % (ref 39.0–52.0)
Hemoglobin: 12.9 g/dL — ABNORMAL LOW (ref 13.0–17.0)
MCH: 29.6 pg (ref 26.0–34.0)
MCHC: 32.3 g/dL (ref 30.0–36.0)
MCV: 91.5 fL (ref 78.0–100.0)

## 2012-04-19 LAB — DIFFERENTIAL
Basophils Relative: 0 % (ref 0–1)
Eosinophils Absolute: 0.2 10*3/uL (ref 0.0–0.7)
Eosinophils Relative: 3 % (ref 0–5)
Monocytes Absolute: 0.7 10*3/uL (ref 0.1–1.0)
Monocytes Relative: 10 % (ref 3–12)

## 2012-04-19 LAB — COMPREHENSIVE METABOLIC PANEL
Albumin: 4 g/dL (ref 3.5–5.2)
BUN: 13 mg/dL (ref 6–23)
Creatinine, Ser: 0.94 mg/dL (ref 0.50–1.35)
GFR calc Af Amer: >90 mL/min (ref >90)
Total Protein: 7.5 g/dL (ref 6.0–8.3)

## 2012-04-19 LAB — SEDIMENTATION RATE: Sed Rate: 11 mm/hr (ref 0–16)

## 2012-04-19 LAB — LACTATE DEHYDROGENASE: LDH: 167 U/L (ref 94–250)

## 2012-04-19 NOTE — Progress Notes (Signed)
Problem number 1 stage II A. diffuse large B-cell lymphoma presenting in January 2005 with severe abdominal pain requiring admission to this hospital. Workup included bone marrow aspirate and biopsy, CT scans, PET scan which revealed stage IIA disease. He was initially treated with [facility] DUE to the possibility this was 8 mantle cell lymphoma. Turned out to be the above diagnoses and he was switched to R. CHOP for 6 cycles. He was in a CR after his second cycle. He remains disease free. Last PET scan was December 2011 and will not be repeated and must thinks change. Problem #2 COPD possible early sleep apnea Problem #3 obesity still weighing 224 pounds on a 6 foot 2 inch frame and his ideal weight is closer to 190. Problem #4 BPH Problem #5 depression and anxiety He is here today with his wife. They're doing very well. He is doing well. He has no B. symptomatology. He does have some frequency of urination at night. This is not new or different. His activity level I think needs to be increased.  Vital signs are stable. He has no lymphadenopathy. His lungs are clear. Heart shows a regular rhythm and rate without murmur rub or gallop. Old port site is intact. Abdomen is soft and nontender without organomegaly. Is no leg edema no arm edema. He does have a large hands but I do not see any other stigmata of growth hormone excess.  I think he has done well. He did have a chest cold last week. Blood work is due today. We will see him in 6 months.

## 2012-04-19 NOTE — Progress Notes (Signed)
William Joseph presented for labwork. Labs per MD order drawn via Peripheral Line 23 gauge needle inserted in right AC  Good blood return present. Procedure without incident.  Needle removed intact. Patient tolerated procedure well.

## 2012-04-19 NOTE — Patient Instructions (Addendum)
[facility] Discharge Instructions  RECOMMENDATIONS MADE BY THE CONSULTANT AND ANY TEST RESULTS WILL BE SENT TO YOUR REFERRING PHYSICIAN.  EXAM FINDINGS BY THE PHYSICIAN TODAY AND SIGNS OR SYMPTOMS TO REPORT TO CLINIC OR PRIMARY PHYSICIAN: exam and discussion by MD.  Bonita Quin are doing well.  We will check some blood work today and again in 6 months.  MEDICATIONS PRESCRIBED:  none  INSTRUCTIONS GIVEN AND DISCUSSED: Report fever, recurring infections, night sweats, etc.  SPECIAL INSTRUCTIONS/FOLLOW-UP: For blood work in 6 months and then to be seen in follow-up.  Thank you for choosing [facility] to provide your oncology and hematology care.  To afford each patient quality time with our providers, please arrive at least 15 minutes before your scheduled appointment time.  With your help, our goal is to use those 15 minutes to complete the necessary work-up to ensure our physicians have the information they need to help with your evaluation and healthcare recommendations.    Effective January 1st, 2014, we ask that you re-schedule your appointment with our physicians should you arrive 10 or more minutes late for your appointment.  We strive to give you quality time with our providers, and arriving late affects you and other patients whose appointments are after yours.    Again, thank you for choosing [facility].  Our hope is that these requests will decrease the amount of time that you wait before being seen by our physicians.       _____________________________________________________________  Should you have questions after your visit to [facility], please contact our office at [phone] between the hours of 8:30 a.m. and 5:00 p.m.  Voicemails left after 4:30 p.m. will not be returned until the following business day.  For prescription refill requests, have your pharmacy contact our office with your prescription refill request.

## 2012-05-18 ENCOUNTER — Other Ambulatory Visit (HOSPITAL_COMMUNITY): Payer: Self-pay | Admitting: Pulmonary Disease

## 2012-05-18 ENCOUNTER — Ambulatory Visit (HOSPITAL_COMMUNITY)
Admission: RE | Admit: 2012-05-18 | Discharge: 2012-05-18 | Disposition: A | Discharge status: Home / Self Care | Payer: Federal, State, Local not specified - PPO | Source: Ambulatory Visit | Attending: Pulmonary Disease | Admitting: Pulmonary Disease

## 2012-05-18 DIAGNOSIS — R52 Pain, unspecified: Secondary | ICD-10-CM

## 2012-05-18 DIAGNOSIS — M25579 Pain in unspecified ankle and joints of unspecified foot: Secondary | ICD-10-CM | POA: Insufficient documentation

## 2012-05-31 ENCOUNTER — Encounter (HOSPITAL_COMMUNITY): Payer: Self-pay | Admitting: Psychiatry

## 2012-05-31 ENCOUNTER — Ambulatory Visit (INDEPENDENT_AMBULATORY_CARE_PROVIDER_SITE_OTHER): Payer: Federal, State, Local not specified - PPO | Admitting: Psychiatry

## 2012-05-31 DIAGNOSIS — F329 Major depressive disorder, single episode, unspecified: Secondary | ICD-10-CM

## 2012-06-01 NOTE — Progress Notes (Signed)
Patient:   William Joseph   DOB:   Apr 14, 1945  MR Number:  [id number]  Location:  [address] [place], [place] [address]  Date of Service:   Monday 05/31/2012  Start Time:   2:15 PM End Time:   3:15 PM  Provider/Observer:  Florencia Reasons, MSW, LCSW   Billing Code/Service:  [phone]  Chief Complaint:     Chief Complaint  Patient presents with  . Depression    Reason for Service:  The patient is referred for services by primary care physician Dr. Kari Baars due to patient experiencing symptoms of depression and anxiety. Patient reports symptoms have been present for a few years. He states feeling as though he is sitting on the edge of a deep dark hole and can't get out of it. He also states thinking that if he doesn't wake up, he would be better off. Patient's wife accompanies him to the appointment and reports  patient seems to be slipping further into depression. He is experiencing anger and irritability as well as withdrawing from social activities. Patient reports stress related to a change in his cognitive functioning since having 6 months of chemotherapy in January 2005 after being diagnosed with non-Hodgkin's lymphoma. He expresses frustration due to memory difficulty and reports becoming very irritable when his train of  thought is interrupted. 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 Most of their staff are wife's family members. Patient expresses frustration and anger as he sees the staff as incompetent and unreliable. He also expresses frustration as he and his wife have minimal leisure time. Patient also expresses disappointment after returning to [place] 10 years ago after he and his wife had lived overseas as well as in [place] and worked for [employer].  Current Status:  The patient reports depressed mood, anxiety, loss of interest in activities, irritability, and anger. He reports passive suicidal ideations with  no plan or intent. He admits he has experienced homicidal ideations with thoughts of going into his office and shooting but denies current homicidal ideations.  Reliability of Information: Reliable  Behavioral Observation: William Joseph  presents as a 67 y.o.-year-old right handed African American Male who appeared his stated age. His dress was appropriate and he was casual in appearance.  His manners were appropriate to the situation.  There were not any physical disabilities noted.  He displayed an appropriate level of cooperation and motivation.    Interactions:    Active   Attention:   within normal limits  Memory:   Impaired immediate memory - recalled 0/3 words  Visuo-spatial:   within normal limits  Speech (Volume):  normal  Speech:   normal pitch and normal volume  Thought Process:  Coherent and Relevant  Though Content:  WNL  Orientation:   person, place, time/date, situation, day of week, month of year and year  Judgment:   Fair  Planning:   Good  Affect:    Angry and Depressed  Mood:    Angry, Depressed and Irritable  Insight:   Fair  Intelligence:   normal  Marital Status/Living: The patient was born in  [place] and reared in [place]. He is second of 5 siblings. He reports having a fairly good childhood and a loving family. The patient left [place] at age 67 and returned to [facility] 10 years ago.  He and his wife have been married for 33 years. They have 2 sons, ages 29 and 64,  and a 42 year old daughter.    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  Substance Use:  No concerns of substance abuse are reported.   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  Medical History:   Past Medical History  Diagnosis Date  . Non Hodgkin's lymphoma   . Sleep apnea   . Anxiety   . Essential hypertension, benign   . History of cardiac catheterization     Normal coronaries March 2011  . Nonerosive nonspecific gastritis     EGD - Dr. Karilyn Cota    Sexual History:   History  Sexual Activity  . Sexually Active: Not on file    Abuse/Trauma History:  denies   Psychiatric History:   Patient reports no psychiatric hospitalizations or previous involvement in outpatient psychotherapy.  Patient reports beginning to take Lexapro in 2007 but discontinuing last year as it was too sedating. He recently was prescribed an antidepressant by his primary care physician and will began taking it in a couple of weeks her primary care physician's instructions per patient's report.  Family Med/Psych History:  Family History  Problem Relation Age of Onset  . Diabetes Father   . Coronary artery disease Mother   . Hypertension Brother    Family Psychiatric History  Patient reports aunts and grandmother had mental issues but cannot specify their conditions.  Risk of Suicide/Violence: Patient denies any suicidal attempts. He reports passive suicidal ideations with no intent and no plan. He reports he has experienced homicidal ideations with thoughts of going into his office and shooting. Patient reports that he will not harm anyone. Patient and wife agree that wife will secure gun. Patient and wife also agree to call this practice, call 911, or take patient to the emergency room should symptoms worsen.   Impression/DX:   The patient presents with symptoms of depression and anxiety that have been present for a few years. He began taking Lexapro in 2007 as prescribed by his oncologist. Patient had 6 months of chemotherapy in January 2005 after being diagnosed with non-Hodgkin's lymphoma. Patient discontinued taking Lexapro last year due to the sedating affects per  patient's report. Symptoms of depression and anxiety have worsened in recent months and include  depressed mood, anxiety, loss of interest in activities, irritability, and anger. He reports passive suicidal ideations with no plan or intent. He admits he has experienced homicidal ideations with thoughts of going into his office and shooting but denies current homicidal ideations. Diagnosis: Depressive disorder NOS, rule out major depressive disorder   Disposition/Plan:   The patient attends the assessment appointment today. Confidentiality and limits are discussed. The patient agrees to return for an appointment in one week for continuing assessment and treatment planning. The patient also agrees to see psychiatrist Dr. Dan Humphreys for medication evaluation. The patient and his wife agree to call this practice, call 911, or take patient to the ER should symptoms worsen.  Diagnosis:    Axis I:  Depressive disorder, not elsewhere classified      Axis II: Deferred       Axis III:  See medical history       Axis IV:  occupational problems and problems related to social environment  Axis V:  51-60 moderate symptoms

## 2012-06-01 NOTE — Patient Instructions (Signed)
Discussed orally 

## 2012-06-07 ENCOUNTER — Ambulatory Visit (HOSPITAL_COMMUNITY): Payer: Self-pay | Admitting: Psychiatry

## 2012-06-17 ENCOUNTER — Ambulatory Visit (INDEPENDENT_AMBULATORY_CARE_PROVIDER_SITE_OTHER): Payer: Federal, State, Local not specified - PPO | Admitting: Psychiatry

## 2012-06-17 ENCOUNTER — Encounter (HOSPITAL_COMMUNITY): Payer: Self-pay | Admitting: Psychiatry

## 2012-06-17 VITALS — Wt 224.0 lb

## 2012-06-17 DIAGNOSIS — E559 Vitamin D deficiency, unspecified: Secondary | ICD-10-CM

## 2012-06-17 DIAGNOSIS — F429 Obsessive-compulsive disorder, unspecified: Secondary | ICD-10-CM

## 2012-06-17 DIAGNOSIS — F411 Generalized anxiety disorder: Secondary | ICD-10-CM

## 2012-06-17 DIAGNOSIS — F341 Dysthymic disorder: Secondary | ICD-10-CM | POA: Insufficient documentation

## 2012-06-17 MED ORDER — CARBAMAZEPINE ER 200 MG PO TB12: 200.0000 mg | ORAL_TABLET | Freq: Two times a day (BID) | ORAL | Status: DC

## 2012-06-17 MED ORDER — PROPRANOLOL HCL 10 MG PO TABS: 10.0000 mg | ORAL_TABLET | Freq: Three times a day (TID) | ORAL | Status: DC
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
## 2012-06-17 NOTE — Patient Instructions (Addendum)
Go back to your sleep study doctor ior family doctor and ask about BiPAP and nasal pillows.  "I am Wishes Fulfilled Meditation" by Marylene Buerger and Lyndal Pulley may be helpful for meditation  Explore music through Pandora or other sources for serenity music that relates to you.   CUT BACK/CUT OUT on sugar and carbohydrates, that means very limited fruits and starchy vegetables and very limited grains, breads  The goal is low GLYCEMIC INDEX.  CUT OUT all wheat, rye, or barley for the GLUTEN in them.  HIGH fat and LOW carbohydrate diet is the KEY.  Eat avocados, eggs, lean meat like grass fed beef and chicken  Nuts and seeds would be good foods as well.   Stevia is an excellent sweetener.  Safe for the brain.   Lowella Grip is also a good safe sweetener.  Almond butter is awesome.  Check out all this on the Internet.  Dr Heber [place] is on the Internet with some good info about this.   Http://www.drperlmutter.com is the site for that.  Try Tegretol 1 at night for a day or two then 2 at night, then could go to 3 for control of anxiety and moods.  Try Inderal for control of anxiety.  It works best 90 minutes before you need it.   Relaxation is the ultimate solution for you.  You can seek it through tub baths, bubble baths, essential oils or incense, walking or chatting with friends, listening to soft music, watching a candle burn and just letting all thoughts go and appreciating the true essence of the Creator.   Yoga is a very helpful exercise method.  On TV, on line, or by DVD Adelfa Koh is a source of high quality information about yoga and videos on yoga.  Renee Ramus is the world's number one video yoga instructor according to some experts.  There are exceptional health benefits that can be achieved through yoga.  The main principles of yoga is acceptance, no competition, no comparison, and no judgement.  It is exceptional in helping people meditate and get to a very relaxed state.   Call  if problems or concerns.

## 2012-07-28 ENCOUNTER — Emergency Department (HOSPITAL_COMMUNITY)
Admission: EM | Admit: 2012-07-28 | Discharge: 2012-07-28 | Discharge status: Against Medical Advice | Payer: Federal, State, Local not specified - PPO | Attending: Emergency Medicine | Admitting: Emergency Medicine

## 2012-07-28 ENCOUNTER — Encounter (HOSPITAL_COMMUNITY): Payer: Self-pay

## 2012-07-28 ENCOUNTER — Emergency Department (HOSPITAL_COMMUNITY): Payer: Federal, State, Local not specified - PPO

## 2012-07-28 DIAGNOSIS — R079 Chest pain, unspecified: Secondary | ICD-10-CM

## 2012-07-28 DIAGNOSIS — Z8659 Personal history of other mental and behavioral disorders: Secondary | ICD-10-CM | POA: Insufficient documentation

## 2012-07-28 DIAGNOSIS — I10 Essential (primary) hypertension: Secondary | ICD-10-CM | POA: Insufficient documentation

## 2012-07-28 DIAGNOSIS — Z87898 Personal history of other specified conditions: Secondary | ICD-10-CM | POA: Insufficient documentation

## 2012-07-28 DIAGNOSIS — Z8719 Personal history of other diseases of the digestive system: Secondary | ICD-10-CM | POA: Insufficient documentation

## 2012-07-28 DIAGNOSIS — Z79899 Other long term (current) drug therapy: Secondary | ICD-10-CM | POA: Insufficient documentation

## 2012-07-28 DIAGNOSIS — F17200 Nicotine dependence, unspecified, uncomplicated: Secondary | ICD-10-CM | POA: Insufficient documentation

## 2012-07-28 DIAGNOSIS — Z9861 Coronary angioplasty status: Secondary | ICD-10-CM | POA: Insufficient documentation

## 2012-07-28 HISTORY — DX: Palpitations: R00.2

## 2012-07-28 LAB — CBC WITH DIFFERENTIAL/PLATELET
Basophils Relative: 0 % (ref 0–1)
Eosinophils Absolute: 0.2 10*3/uL (ref 0.0–0.7)
Eosinophils Relative: 4 % (ref 0–5)
Lymphs Abs: 3.3 10*3/uL (ref 0.7–4.0)
MCH: 30 pg (ref 26.0–34.0)
MCHC: 32.9 g/dL (ref 30.0–36.0)
MCV: 91.2 fL (ref 78.0–100.0)
Monocytes Relative: 7 % (ref 3–12)
Platelets: 160 10*3/uL (ref 150–400)
RBC: 4.54 MIL/uL (ref 4.22–5.81)

## 2012-07-28 LAB — BASIC METABOLIC PANEL
BUN: 10 mg/dL (ref 6–23)
Calcium: 9.8 mg/dL (ref 8.4–10.5)
GFR calc non Af Amer: 76 mL/min — ABNORMAL LOW (ref >90)
Glucose, Bld: 90 mg/dL (ref 70–99)
Sodium: 140 mEq/L (ref 135–145)

## 2012-07-28 NOTE — ED Notes (Signed)
F/u information provided - instructed to return for any new or worsening s/s. Verbalizes understanding.

## 2012-07-28 NOTE — ED Provider Notes (Signed)
History     CSN: [id number]  Arrival date & time 07/28/12  1224   First MD Initiated Contact with Patient 07/28/12 1416      Chief Complaint  Patient presents with  . Chest Pain     HPI Pt was seen at 1425.  Per pt, c/o gradual onset and resolution of several intermittent episodes of mid-sternal chest discomfort that began this morning approx 0800.  Pt states he woke up with symptoms.  Has been associated with SOB.  States the symptoms worsened with exertion (walking up/down steps), improved with resting.  Episodes lasted approx 30 minutes each.  Denies palpitations, no cough, no back pain, no abd pain, no N/V/D, no fevers.     Cards: Dr. Diona Browner Past Medical History  Diagnosis Date  . Non Hodgkin's lymphoma   . Sleep apnea   . Anxiety   . Essential hypertension, benign   . History of cardiac catheterization     Normal coronaries March 2011  . Nonerosive nonspecific gastritis     EGD - Dr. Karilyn Cota  . Obsessive-compulsive disorder   . Palpitations     Past Surgical History  Procedure Laterality Date  . Tooth implant    . Port-a-cath placement    . Exploratory laparotomy    . Lymph node biopsy    . Esophagogastroduodenoscopy endoscopy N/A     Family History  Problem Relation Age of Onset  . Diabetes Father   . Coronary artery disease Mother   . Hypertension Brother   . Mental illness Maternal Uncle   . Dementia Maternal Grandfather   . Seizures Cousin   . ADD / ADHD Neg Hx   . Alcohol abuse Neg Hx   . Anxiety disorder Neg Hx   . Bipolar disorder Neg Hx   . Depression Neg Hx   . OCD Neg Hx   . Paranoid behavior Neg Hx   . Schizophrenia Neg Hx   . Sexual abuse Neg Hx   . Physical abuse Neg Hx   . Drug abuse Other   . Seizures Maternal Uncle     History  Substance Use Topics  . Smoking status: Light Tobacco Smoker -- 1.00 packs/day for 1 years    Types: Cigars  . Smokeless tobacco: Never Used     Comment: Puffs on cigars every so often.   . Alcohol  Use: No      Review of Systems ROS: Statement: All systems negative except as marked or noted in the HPI; Constitutional: Negative for fever and chills. ; ; Eyes: Negative for eye pain, redness and discharge. ; ; ENMT: Negative for ear pain, hoarseness, nasal congestion, sinus pressure and sore throat. ; ; Cardiovascular: +CP, SOB. Negative for palpitations, diaphoresis and peripheral edema. ; ; Respiratory: Negative for cough, wheezing and stridor. ; ; Gastrointestinal: Negative for nausea, vomiting, diarrhea, abdominal pain, blood in stool, hematemesis, jaundice and rectal bleeding. . ; ; Genitourinary: Negative for dysuria, flank pain and hematuria. ; ; Musculoskeletal: Negative for back pain and neck pain. Negative for swelling and trauma.; ; Skin: Negative for pruritus, rash, abrasions, blisters, bruising and skin lesion.; ; Neuro: Negative for headache, lightheadedness and neck stiffness. Negative for weakness, altered level of consciousness , altered mental status, extremity weakness, paresthesias, involuntary movement, seizure and syncope.       Allergies  Review of patient's allergies indicates no known allergies.  Home Medications   Current Outpatient Rx  Name  Route  Sig  Dispense  Refill  .  Aspirin-Caffeine (BC FAST PAIN RELIEF PO)   Oral   Take 1 packet by mouth daily as needed (pain).          Marland Kitchen b complex vitamins capsule   Oral   Take 1 capsule by mouth daily.         Marland Kitchen dutasteride (AVODART) 0.5 MG capsule   Oral   Take 0.5 mg by mouth daily.           Marland Kitchen loratadine-pseudoephedrine (CLARITIN-D 24-HOUR) 10-240 MG per 24 hr tablet   Oral   Take 1 tablet by mouth daily.         . Multiple Vitamins-Minerals (MULTIVITAMIN GUMMIES ADULT PO)   Oral   Take 1 capsule by mouth daily.          . sodium chloride (OCEAN) 0.65 % nasal spray   Nasal   Place 1 spray into the nose as needed.         . tadalafil (CIALIS) 5 MG tablet   Oral   Take 5 mg by mouth  daily.          . Tamsulosin HCl (FLOMAX) 0.4 MG CAPS   Oral   Take 0.4 mg by mouth daily.             BP 152/87  Pulse 58  Temp(Src) 97.4 F (36.3 C)  Resp 18  Ht 6\' 2"  (1.88 m)  Wt 225 lb (102.059 kg)  BMI 28.88 kg/m2  SpO2 100%  Physical Exam 1430: Physical examination:  Nursing notes reviewed; Vital signs and O2 SAT reviewed;  Constitutional: Well developed, Well nourished, Well hydrated, In no acute distress; Head:  Normocephalic, atraumatic; Eyes: EOMI, PERRL, No scleral icterus; ENMT: Mouth and pharynx normal, Mucous membranes moist; Neck: Supple, Full range of motion, No lymphadenopathy; Cardiovascular: Regular rate and rhythm, No gallop; Respiratory: Breath sounds clear & equal bilaterally, No rales, rhonchi, wheezes.  Speaking full sentences with ease, Normal respiratory effort/excursion; Chest: Nontender, Movement normal; Abdomen: Soft, Nontender, Nondistended, Normal bowel sounds;; Extremities: Pulses normal, No tenderness, No edema, No calf edema or asymmetry.; Neuro: AA&Ox3, Major CN grossly intact.  Speech clear. No gross focal motor or sensory deficits in extremities.; Skin: Color normal, Warm, Dry.   ED Course  Procedures    MDM  MDM Reviewed: previous chart, nursing note and vitals Reviewed previous: labs Interpretation: ECG, labs and x-ray      Date: 07/28/2012  Rate: 70  Rhythm: normal sinus rhythm and premature ventricular contractions (PVC)  QRS Axis: normal  Intervals: normal  ST/T Wave abnormalities: normal  Conduction Disutrbances:none  Narrative Interpretation:   Old EKG Reviewed: none available.  Results for orders placed during the hospital encounter of 07/28/12  CBC WITH DIFFERENTIAL      Result Value Range   WBC 5.9  4.0 - 10.5 K/uL   RBC 4.54  4.22 - 5.81 MIL/uL   Hemoglobin 13.6  13.0 - 17.0 g/dL   HCT 56.3  87.5 - 64.3 %   MCV 91.2  78.0 - 100.0 fL   MCH 30.0  26.0 - 34.0 pg   MCHC 32.9  30.0 - 36.0 g/dL   RDW 32.9  51.8 -  84.1 %   Platelets 160  150 - 400 K/uL   Neutrophils Relative 33 (*) 43 - 77 %   Neutro Abs 2.0  1.7 - 7.7 K/uL   Lymphocytes Relative 56 (*) 12 - 46 %   Lymphs Abs 3.3  0.7 - 4.0 K/uL  Monocytes Relative 7  3 - 12 %   Monocytes Absolute 0.4  0.1 - 1.0 K/uL   Eosinophils Relative 4  0 - 5 %   Eosinophils Absolute 0.2  0.0 - 0.7 K/uL   Basophils Relative 0  0 - 1 %   Basophils Absolute 0.0  0.0 - 0.1 K/uL  BASIC METABOLIC PANEL      Result Value Range   Sodium 140  135 - 145 mEq/L   Potassium 3.7  3.5 - 5.1 mEq/L   Chloride 103  96 - 112 mEq/L   CO2 26  19 - 32 mEq/L   Glucose, Bld 90  70 - 99 mg/dL   BUN 10  6 - 23 mg/dL   Creatinine, Ser 4.54  0.50 - 1.35 mg/dL   Calcium 9.8  8.4 - 09.8 mg/dL   GFR calc non Af Amer 76 (*) >90 mL/min   GFR calc Af Amer 88 (*) >90 mL/min  TROPONIN I      Result Value Range   Troponin I <0.30  <0.30 ng/mL   Dg Chest 2 View 07/28/2012  *RADIOLOGY REPORT*  Clinical Data: Chest pain and shortness of breath.  CHEST - 2 VIEW  Comparison: One-view chest 06/14/2009.  Findings: The heart size is normal.  Mild emphysematous changes are evident.  No focal airspace disease is present.  The visualized soft tissues and bony thorax are unremarkable.  IMPRESSION:  1.  Emphysema. 2.  No acute cardiopulmonary disease.   Original Report Authenticated By: Marin Roberts, M.D.     1440:  Concerning HPI, but reassuring negative cardiac cath in 2011, as well as normal echocardiogram last year. Pt informed re: dx testing results, including concerning HPI for ACS and that I recommend observation admission for further evaluation.  Pt refuses admission.  I encouraged pt to stay, continues to refuse.  Pt makes his own medical decisions.  Risks of AMA explained to pt, including, but not limited to:  stroke, heart attack, cardiac arrythmia ("irregular heart rate/beat"), "passing out," temporary and/or permanent disability, death.  Pt verb understanding and continues to refuse  admission, understanding the consequences of his decision.  I encouraged pt to follow up with his PMD and the Cardiac MD today and return to the ED immediately if symptoms return, or for any other concerns.  Pt verb understanding, agreeable.          Laray Anger, DO 07/29/12 1658

## 2012-07-28 NOTE — ED Notes (Signed)
Pt reports woke up this am with pressure in center of chest, SOB. r hand feeling shakey since arrival to ED and headache.

## 2012-07-28 NOTE — ED Notes (Signed)
Denies chest pain presently.

## 2012-07-28 NOTE — ED Notes (Signed)
Patient is resting comfortably. 

## 2012-07-28 NOTE — ED Notes (Signed)
Patient given water and peanut butter crackers per MD approval.

## 2012-07-28 NOTE — ED Notes (Signed)
Returns from radiology.  Placed on cardiac monitor.

## 2012-10-18 ENCOUNTER — Encounter (HOSPITAL_COMMUNITY): Payer: Federal, State, Local not specified - PPO | Attending: Oncology

## 2012-10-18 DIAGNOSIS — C8589 Other specified types of non-Hodgkin lymphoma, extranodal and solid organ sites: Secondary | ICD-10-CM | POA: Insufficient documentation

## 2012-10-18 LAB — CBC WITH DIFFERENTIAL/PLATELET
Basophils Absolute: 0 10*3/uL (ref 0.0–0.1)
Basophils Relative: 0 % (ref 0–1)
Eosinophils Absolute: 0.2 10*3/uL (ref 0.0–0.7)
Eosinophils Relative: 5 % (ref 0–5)
MCH: 30 pg (ref 26.0–34.0)
MCV: 92.8 fL (ref 78.0–100.0)
Platelets: 189 10*3/uL (ref 150–400)
RDW: 13.8 % (ref 11.5–15.5)

## 2012-10-18 LAB — COMPREHENSIVE METABOLIC PANEL
ALT: 11 U/L (ref 0–53)
AST: 13 U/L (ref 0–37)
Calcium: 9.4 mg/dL (ref 8.4–10.5)
GFR calc Af Amer: >90 mL/min (ref >90)
Sodium: 140 mEq/L (ref 135–145)
Total Protein: 6.9 g/dL (ref 6.0–8.3)

## 2012-10-18 NOTE — Progress Notes (Signed)
William Joseph's reason for visit today are for labs as scheduled per MD orders.  Venipuncture performed with a 23 gauge butterfly needle to R Antecubital\.  William Joseph tolerated venipuncture well and without incident; questions were answered and patient was discharged.

## 2012-10-19 ENCOUNTER — Encounter (HOSPITAL_BASED_OUTPATIENT_CLINIC_OR_DEPARTMENT_OTHER): Payer: Federal, State, Local not specified - PPO

## 2012-10-19 ENCOUNTER — Encounter (HOSPITAL_COMMUNITY): Payer: Self-pay

## 2012-10-19 VITALS — BP 136/88 | HR 60 | Temp 97.5°F | Resp 16 | Wt 216.8 lb

## 2012-10-19 DIAGNOSIS — C8589 Other specified types of non-Hodgkin lymphoma, extranodal and solid organ sites: Secondary | ICD-10-CM

## 2012-10-19 NOTE — Progress Notes (Signed)
[facility] [phone]   [phone]  OFFICE PROGRESS NOTE  William Maudlin, MD 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 diffuse large B-cell lymphoma presenting in January 2005 with severe abdominal pain requiring admission to this hospital. Workup included bone marrow aspirate and biopsy, CT scans, PET scan which revealed stage IIA disease. He was initially treated with [facility] DUE to the possibility this was 8 mantle cell lymphoma. Turned out to be the above diagnoses and he was switched to R. CHOP for 6 cycles. He was in a CR after his second cycle. He remains disease free. Last PET scan was December 2011 .  INTERVAL HISTORY:   William Joseph 67 y.o. male returns to the clinic today for scheduled follow up visit accompanied by his wife. He has lost approximately 8 pounds since his last visit which she tells me was intentional as he was working towards losing weight.  Denies any lymphadenopathy, night sweats, fever or, fatigue or shortness of breath.  He is eating well.  Has no new concerns today.   MEDICAL HISTORY: Past Medical History  Diagnosis Date  . Non Hodgkin's lymphoma   . Sleep apnea   . Anxiety   . Essential hypertension, benign   . History of cardiac catheterization     Normal coronaries March 2011  . Nonerosive nonspecific gastritis     EGD - Dr. Karilyn Cota  . Obsessive-compulsive disorder   . Palpitations     ALLERGIES:  has No Known Allergies.  MEDICATIONS:  Current Outpatient Prescriptions  Medication Sig Dispense Refill  . Aspirin-Caffeine (BC FAST PAIN RELIEF PO) Take 1 packet by mouth daily as needed (pain).       Marland Kitchen dutasteride (AVODART) 0.5 MG capsule Take 0.5 mg by mouth daily.        . Multiple Vitamins-Minerals (MULTIVITAMIN GUMMIES ADULT PO) Take 1 capsule by mouth daily.       . tadalafil  (CIALIS) 5 MG tablet Take 5 mg by mouth daily.       . Tamsulosin HCl (FLOMAX) 0.4 MG CAPS Take 0.4 mg by mouth daily.        Marland Kitchen b complex vitamins capsule Take 1 capsule by mouth daily.      Marland Kitchen loratadine-pseudoephedrine (CLARITIN-D 24-HOUR) 10-240 MG per 24 hr tablet Take 1 tablet by mouth daily.      . sodium chloride (OCEAN) 0.65 % nasal spray Place 1 spray into the nose as needed.       No current facility-administered medications for this visit.    SURGICAL HISTORY:  Past Surgical History  Procedure Laterality Date  . Tooth implant    . Port-a-cath placement    . Exploratory laparotomy    . Lymph node biopsy    . Esophagogastroduodenoscopy endoscopy N/A      REVIEW OF SYSTEMS:14 point review of system is as in the history above otherwise negative.   PHYSICAL EXAMINATION:  Blood pressure 136/88, pulse 60, temperature 97.5 F (36.4 C), temperature source Oral, resp. rate 16, weight 216 lb 12.8 oz (98.34 kg). GENERAL: No distress. SKIN:  No rashes or significant lesions.  HEAD: Normocephalic, No masses, lesions, tenderness or abnormalities  EYES: Conjunctiva are pink and non-injected  ENT: External ears normal ,lips, buccal mucosa, and tongue normal and mucous membranes are moist  LYMPH: No palpable lymphadenopathy, in the  neck, jaw, supraclavicular, axillary or inguinal lymph node areas. LUNGS: clear to auscultation , no crackles or wheezes HEART: regular rate & rhythm, no murmurs, no gallops, S1 normal and S2 normal  ABDOMEN: Abdomen soft, non-tender, normal bowel sounds, no masses or organomegaly and no hepatosplenomegaly palpable MSK: No CVA tenderness and no tenderness on percussion of the back or rib cage. EXTREMITIES: No edema, no skin discoloration or tenderness NEURO: alert & oriented , no focal motor/sensory deficits.     LABORATORY DATA: Lab Results  Component Value Date   WBC 5.1 10/18/2012   HGB 12.9* 10/18/2012   HCT 39.9 10/18/2012   MCV 92.8 10/18/2012    PLT 189 10/18/2012      Chemistry      Component Value Date/Time   NA 140 10/18/2012 0923   K 3.9 10/18/2012 0923   CL 106 10/18/2012 0923   CO2 27 10/18/2012 0923   BUN 12 10/18/2012 0923   CREATININE 0.97 10/18/2012 0923      Component Value Date/Time   CALCIUM 9.4 10/18/2012 0923   ALKPHOS 57 10/18/2012 0923   AST 13 10/18/2012 0923   ALT 11 10/18/2012 0923   BILITOT 0.4 10/18/2012 0923       RADIOGRAPHIC STUDIES: No results found.   ASSESSMENT:  William Joseph has no evidence of disease at this time. He does not have any lymphadenopathy palpable at this time.  I explained to him that relapse of lymphoma at this time is very unlikely I did not recommend continuing regular imaging I think that clinical follow up is sufficient at this time and imaging can be done as indicated.   PLAN:  1. He will return to clinic in 6 months for follow up. 2. CBC CMP LDH ordered for next visit.   All questions were satisfactorily answered. Patient knows to call if  any concern arises.  I spent more than 50 % counseling the patient face to face. The total time spent in the appointment was 30 minutes.   Sherral Hammers, MD FACP. Hematology/Oncology.

## 2012-10-19 NOTE — Patient Instructions (Addendum)
[facility] Discharge Instructions  RECOMMENDATIONS MADE BY THE CONSULTANT AND ANY TEST RESULTS WILL BE SENT TO YOUR REFERRING PHYSICIAN.  EXAM FINDINGS BY THE PHYSICIAN TODAY AND SIGNS OR SYMPTOMS TO REPORT TO CLINIC OR PRIMARY PHYSICIAN: Exam and discussion by Dr. Sharia Reeve.  You are doing well.  MEDICATIONS PRESCRIBED:  None  INSTRUCTIONS GIVEN AND DISCUSSED: Report night sweats, new lumps, recurring infections etc.  SPECIAL INSTRUCTIONS/FOLLOW-UP: Blood work and follow-up in 6 months.  Thank you for choosing [facility] to provide your oncology and hematology care.  To afford each patient quality time with our providers, please arrive at least 15 minutes before your scheduled appointment time.  With your help, our goal is to use those 15 minutes to complete the necessary work-up to ensure our physicians have the information they need to help with your evaluation and healthcare recommendations.    Effective January 1st, 2014, we ask that you re-schedule your appointment with our physicians should you arrive 10 or more minutes late for your appointment.  We strive to give you quality time with our providers, and arriving late affects you and other patients whose appointments are after yours.    Again, thank you for choosing [facility].  Our hope is that these requests will decrease the amount of time that you wait before being seen by our physicians.       _____________________________________________________________  Should you have questions after your visit to [facility], please contact our office at [phone] between the hours of 8:30 a.m. and 5:00 p.m.  Voicemails left after 4:30 p.m. will not be returned until the following business day.  For prescription refill requests, have your pharmacy contact our office with your prescription refill request.

## 2013-04-18 ENCOUNTER — Encounter (HOSPITAL_COMMUNITY): Payer: Federal, State, Local not specified - PPO | Attending: Hematology and Oncology

## 2013-04-18 DIAGNOSIS — M722 Plantar fascial fibromatosis: Secondary | ICD-10-CM | POA: Insufficient documentation

## 2013-04-18 DIAGNOSIS — F429 Obsessive-compulsive disorder, unspecified: Secondary | ICD-10-CM | POA: Insufficient documentation

## 2013-04-18 DIAGNOSIS — I10 Essential (primary) hypertension: Secondary | ICD-10-CM | POA: Insufficient documentation

## 2013-04-18 DIAGNOSIS — C8589 Other specified types of non-Hodgkin lymphoma, extranodal and solid organ sites: Secondary | ICD-10-CM | POA: Insufficient documentation

## 2013-04-18 DIAGNOSIS — G4733 Obstructive sleep apnea (adult) (pediatric): Secondary | ICD-10-CM | POA: Insufficient documentation

## 2013-04-18 LAB — CBC WITH DIFFERENTIAL/PLATELET
BASOS ABS: 0 10*3/uL (ref 0.0–0.1)
BASOS PCT: 1 % (ref 0–1)
EOS ABS: 0.2 10*3/uL (ref 0.0–0.7)
Eosinophils Relative: 4 % (ref 0–5)
HEMATOCRIT: 41.1 % (ref 39.0–52.0)
HEMOGLOBIN: 13.5 g/dL (ref 13.0–17.0)
Lymphocytes Relative: 49 % — ABNORMAL HIGH (ref 12–46)
Lymphs Abs: 2.7 10*3/uL (ref 0.7–4.0)
MCH: 30.8 pg (ref 26.0–34.0)
MCHC: 32.8 g/dL (ref 30.0–36.0)
MCV: 93.6 fL (ref 78.0–100.0)
MONO ABS: 0.3 10*3/uL (ref 0.1–1.0)
MONOS PCT: 6 % (ref 3–12)
NEUTROS ABS: 2.2 10*3/uL (ref 1.7–7.7)
NEUTROS PCT: 40 % — AB (ref 43–77)
Platelets: 168 10*3/uL (ref 150–400)
RBC: 4.39 MIL/uL (ref 4.22–5.81)
RDW: 13.7 % (ref 11.5–15.5)
WBC: 5.4 10*3/uL (ref 4.0–10.5)

## 2013-04-18 LAB — COMPREHENSIVE METABOLIC PANEL
ALBUMIN: 4 g/dL (ref 3.5–5.2)
ALT: 14 U/L (ref 0–53)
AST: 15 U/L (ref 0–37)
Alkaline Phosphatase: 61 U/L (ref 39–117)
BUN: 15 mg/dL (ref 6–23)
CO2: 27 mEq/L (ref 19–32)
CREATININE: 1.02 mg/dL (ref 0.50–1.35)
Calcium: 9.7 mg/dL (ref 8.4–10.5)
Chloride: 107 mEq/L (ref 96–112)
GFR calc Af Amer: 87 mL/min — ABNORMAL LOW (ref >90)
GFR calc non Af Amer: 75 mL/min — ABNORMAL LOW (ref >90)
Glucose, Bld: 123 mg/dL — ABNORMAL HIGH (ref 70–99)
Potassium: 4.5 mEq/L (ref 3.7–5.3)
Sodium: 145 mEq/L (ref 137–147)
TOTAL PROTEIN: 7.6 g/dL (ref 6.0–8.3)
Total Bilirubin: 0.5 mg/dL (ref 0.3–1.2)

## 2013-04-18 LAB — LACTATE DEHYDROGENASE: LDH: 162 U/L (ref 94–250)

## 2013-04-18 NOTE — Progress Notes (Signed)
Labs drawn today for cbc/diff,b64mic,cmp,ldh

## 2013-04-19 ENCOUNTER — Encounter (HOSPITAL_COMMUNITY): Payer: Self-pay

## 2013-04-19 ENCOUNTER — Encounter (HOSPITAL_BASED_OUTPATIENT_CLINIC_OR_DEPARTMENT_OTHER): Payer: Federal, State, Local not specified - PPO

## 2013-04-19 VITALS — BP 147/97 | HR 69 | Temp 98.3°F | Resp 16 | Wt 216.4 lb

## 2013-04-19 DIAGNOSIS — C8589 Other specified types of non-Hodgkin lymphoma, extranodal and solid organ sites: Secondary | ICD-10-CM

## 2013-04-19 DIAGNOSIS — Z87898 Personal history of other specified conditions: Secondary | ICD-10-CM

## 2013-04-19 DIAGNOSIS — R599 Enlarged lymph nodes, unspecified: Secondary | ICD-10-CM

## 2013-04-19 DIAGNOSIS — R5383 Other fatigue: Secondary | ICD-10-CM

## 2013-04-19 DIAGNOSIS — R5381 Other malaise: Secondary | ICD-10-CM

## 2013-04-19 LAB — BETA 2 MICROGLOBULIN, SERUM: BETA 2 MICROGLOBULIN: 1.25 mg/L (ref 1.01–1.73)

## 2013-04-19 NOTE — Progress Notes (Signed)
[facility]  OFFICE PROGRESS NOTE  [name], MD [address] [place] [address]  DIAGNOSIS: NON-HODGKIN'S LYMPHOMA - Plan: Beta 2 microglobuline, serum, meloxicam (MOBIC) 15 MG tablet, CBC with Differential, Comprehensive metabolic panel, Lactate dehydrogenase, Beta 2 microglobuline, serum  Chief Complaint  Patient presents with  . Lymphoma    Diffuse large B cell    CURRENT THERAPY: Diffuse large B-cell lymphoma, on watchful expectation after chemotherapy ending in May 2005  INTERVAL HISTORY: Kendarius Cartaya 68 y.o. male returns for followup of diffuse large B cell lymphoma originally diagnosed in January 2005 and treated at first with [facility] and then switching to R.-CHOP when mantle cell lymphoma was eliminated as a possibility. He achieved a CR after second cycle. He did not receive consolidative radiotherapy and last PET CT scan was done in December 2011 with negative findings.  Primary problem now is plantar fasciitis involving both feet for which he takes meloxicam. Appetite is good with no nausea, vomiting, melena, hematochezia, hematuria, lower extremity swelling or redness, PND, orthopnea, palpitations, but still with residual minimal paresthesias involving both upper extremities making it more difficult for him to use his computer. He denies any urinary hesitancy, PND, orthopnea, palpitations, skin rash, joint pain, headache, or seizures. He also denies any fever, night sweats, or easy satiety.  MEDICAL HISTORY: Past Medical History  Diagnosis Date  . Non Hodgkin's lymphoma   . Sleep apnea   . Anxiety   . Essential hypertension, benign   . History of cardiac catheterization     Normal coronaries March 2011  . Nonerosive nonspecific gastritis     EGD - Dr. Laural Golden  . Obsessive-compulsive disorder   . Palpitations     INTERIM HISTORY: has NON-HODGKIN'S LYMPHOMA; ANXIETY; Essential hypertension,  benign; SLEEP APNEA; Shortness of breath; Palpitations; and Dysthymia on his problem list.   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 Workup included bone marrow aspirate and biopsy, CT scans, PET scan which revealed stage IIA disease. He was initially treated with [facility] due to the possibility this was  mantle cell lymphoma. Turned out to be the above diagnoses and he was switched to R. CHOP for 6 cycles. He was in a CR after his second cycle. He remains disease free. Last PET scan was December 2011   ALLERGIES:  has No Known Allergies.  MEDICATIONS: has a current medication list which includes the following prescription(s): aspirin-caffeine, b complex vitamins, dutasteride, meloxicam, multiple vitamins-minerals, tadalafil, tamsulosin, loratadine-pseudoephedrine, and sodium chloride.  SURGICAL HISTORY:  Past Surgical History  Procedure Laterality Date  . Tooth implant    . Port-a-cath placement    . Exploratory laparotomy    . Lymph node biopsy    . Esophagogastroduodenoscopy endoscopy N/A     FAMILY HISTORY: family history includes Coronary artery disease in his mother; Dementia in his maternal grandfather; Diabetes in his father; Drug abuse in his other; Hypertension in his brother; Mental illness in his maternal uncle; Seizures in his cousin and maternal uncle. There is no history of ADD / ADHD, Alcohol abuse, Anxiety disorder, Bipolar disorder, Depression, OCD, Paranoid behavior, Schizophrenia, Sexual abuse, or Physical abuse.  SOCIAL HISTORY:  reports that he has been smoking Cigars.  He has never used smokeless tobacco. He reports that he does not drink alcohol or use illicit drugs.  REVIEW OF SYSTEMS:  Other than  that discussed above is noncontributory.  PHYSICAL EXAMINATION: ECOG PERFORMANCE STATUS: 1 - Symptomatic but completely ambulatory  Blood pressure 147/97, pulse 69, temperature 98.3  F (36.8 C), temperature source Oral, resp. rate 16, weight 216 lb 6.4 oz (98.158 kg).  GENERAL:alert, no distress and comfortable SKIN: skin color, texture, turgor are normal, no rashes or significant lesions EYES: PERLA; Conjunctiva are pink and non-injected, sclera clear OROPHARYNX:no exudate, no erythema on lips, buccal mucosa, or tongue. NECK: supple, thyroid normal size, non-tender, without nodularity. No masses CHEST: Normal AP diameter with no breast masses. LYMPH:  no palpable lymphadenopathy in the cervical, axillary or inguinal LUNGS: clear to auscultation and percussion with normal breathing effort HEART: regular rate & rhythm and no murmurs. ABDOMEN:abdomen soft, non-tender and normal bowel sounds MUSCULOSKELETAL:no cyanosis of digits and no clubbing. Range of motion normal.  NEURO: alert & oriented x 3 with fluent speech, no focal motor/sensory deficits   LABORATORY DATA: Infusion on 04/18/2013  Component Date Value Range Status  . WBC 04/18/2013 5.4  4.0 - 10.5 K/uL Final  . RBC 04/18/2013 4.39  4.22 - 5.81 MIL/uL Final  . Hemoglobin 04/18/2013 13.5  13.0 - 17.0 g/dL Final  . HCT 04/18/2013 41.1  39.0 - 52.0 % Final  . MCV 04/18/2013 93.6  78.0 - 100.0 fL Final  . MCH 04/18/2013 30.8  26.0 - 34.0 pg Final  . MCHC 04/18/2013 32.8  30.0 - 36.0 g/dL Final  . RDW 04/18/2013 13.7  11.5 - 15.5 % Final  . Platelets 04/18/2013 168  150 - 400 K/uL Final  . Neutrophils Relative % 04/18/2013 40* 43 - 77 % Final  . Neutro Abs 04/18/2013 2.2  1.7 - 7.7 K/uL Final  . Lymphocytes Relative 04/18/2013 49* 12 - 46 % Final  . Lymphs Abs 04/18/2013 2.7  0.7 - 4.0 K/uL Final  . Monocytes Relative 04/18/2013 6  3 - 12 % Final  . Monocytes Absolute 04/18/2013 0.3  0.1 - 1.0 K/uL Final  . Eosinophils Relative 04/18/2013 4  0 - 5 % Final  . Eosinophils Absolute 04/18/2013 0.2  0.0 - 0.7 K/uL Final  . Basophils Relative 04/18/2013 1  0 - 1 % Final  . Basophils Absolute 04/18/2013 0.0  0.0  - 0.1 K/uL Final  . Sodium 04/18/2013 145  137 - 147 mEq/L Final  . Potassium 04/18/2013 4.5  3.7 - 5.3 mEq/L Final  . Chloride 04/18/2013 107  96 - 112 mEq/L Final  . CO2 04/18/2013 27  19 - 32 mEq/L Final  . Glucose, Bld 04/18/2013 123* 70 - 99 mg/dL Final  . BUN 04/18/2013 15  6 - 23 mg/dL Final  . Creatinine, Ser 04/18/2013 1.02  0.50 - 1.35 mg/dL Final  . Calcium 04/18/2013 9.7  8.4 - 10.5 mg/dL Final  . Total Protein 04/18/2013 7.6  6.0 - 8.3 g/dL Final  . Albumin 04/18/2013 4.0  3.5 - 5.2 g/dL Final  . AST 04/18/2013 15  0 - 37 U/L Final  . ALT 04/18/2013 14  0 - 53 U/L Final  . Alkaline Phosphatase 04/18/2013 61  39 - 117 U/L Final  . Total Bilirubin 04/18/2013 0.5  0.3 - 1.2 mg/dL Final  . GFR calc non Af Amer 04/18/2013 75* >90 mL/min Final  . GFR calc Af Amer 04/18/2013 87* >90 mL/min Final   Comment: (NOTE)  The eGFR has been calculated using the CKD EPI equation.                          This calculation has not been validated in all clinical situations.                          eGFR's persistently <90 mL/min signify possible Chronic Kidney                          Disease.  Marland Kitchen LDH 04/18/2013 162  94 - 250 U/L Final    PATHOLOGY: No new pathology.  Urinalysis No results found for this basename: colorurine,  appearanceur,  labspec,  phurine,  glucoseu,  hgbur,  bilirubinur,  ketonesur,  proteinur,  urobilinogen,  nitrite,  leukocytesur    RADIOGRAPHIC STUDIES: No results found.  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 Workup included bone marrow aspirate and biopsy, CT scans, PET scan which revealed stage IIA disease. He was initially treated with [facility] due to the possibility this was  mantle cell lymphoma. Turned out to be the above diagnoses and he was switched to R. CHOP for 6 cycles. He was in a CR after his second cycle. He remains  disease free. Last PET scan was December 2011. #2. Plantar fasciitis, on treatment. #3. Obstructive sleep apnea, on treatment. #4. Hypertension, controlled. #5. History of gastritis by EGD, tolerating meloxicam well for now.    PLAN:  #1. Continue watchful expectation per watch for new lymphadenopathy, severe fatigue, fever, easy satiety, or dark urine. #2. Followup in 6 months with lab tests.   All questions were answered. The patient knows to call the clinic with any problems, questions or concerns. We can certainly see the patient much sooner if necessary.   I spent 25 minutes counseling the patient face to face. The total time spent in the appointment was 30 minutes.    Doroteo Bradford, MD 04/19/2013 12:04 PM

## 2013-04-19 NOTE — Patient Instructions (Signed)
[facility] Discharge Instructions  RECOMMENDATIONS MADE BY THE CONSULTANT AND ANY TEST RESULTS WILL BE SENT TO YOUR REFERRING PHYSICIAN.  Return to clinic in 6 months for lab work and then MD appointment. Report any issues/concerns to clinic as needed prior to appointments.  Thank you for choosing [facility] to provide your oncology and hematology care.  To afford each patient quality time with our providers, please arrive at least 15 minutes before your scheduled appointment time.  With your help, our goal is to use those 15 minutes to complete the necessary work-up to ensure our physicians have the information they need to help with your evaluation and healthcare recommendations.    Effective January 1st, 2014, we ask that you re-schedule your appointment with our physicians should you arrive 10 or more minutes late for your appointment.  We strive to give you quality time with our providers, and arriving late affects you and other patients whose appointments are after yours.    Again, thank you for choosing [facility].  Our hope is that these requests will decrease the amount of time that you wait before being seen by our physicians.       _____________________________________________________________  Should you have questions after your visit to [facility], please contact our office at [phone] between the hours of 8:30 a.m. and 5:00 p.m.  Voicemails left after 4:30 p.m. will not be returned until the following business day.  For prescription refill requests, have your pharmacy contact our office with your prescription refill request.

## 2013-04-21 ENCOUNTER — Other Ambulatory Visit (HOSPITAL_COMMUNITY): Payer: Self-pay

## 2013-09-29 ENCOUNTER — Ambulatory Visit (INDEPENDENT_AMBULATORY_CARE_PROVIDER_SITE_OTHER): Payer: Federal, State, Local not specified - PPO | Admitting: Otolaryngology

## 2013-09-29 DIAGNOSIS — H60339 Swimmer's ear, unspecified ear: Secondary | ICD-10-CM

## 2013-10-21 ENCOUNTER — Encounter (HOSPITAL_COMMUNITY): Payer: Federal, State, Local not specified - PPO | Attending: Hematology and Oncology

## 2013-10-21 DIAGNOSIS — Z791 Long term (current) use of non-steroidal anti-inflammatories (NSAID): Secondary | ICD-10-CM | POA: Insufficient documentation

## 2013-10-21 DIAGNOSIS — Z87898 Personal history of other specified conditions: Secondary | ICD-10-CM | POA: Insufficient documentation

## 2013-10-21 DIAGNOSIS — C8589 Other specified types of non-Hodgkin lymphoma, extranodal and solid organ sites: Secondary | ICD-10-CM

## 2013-10-21 DIAGNOSIS — Z9221 Personal history of antineoplastic chemotherapy: Secondary | ICD-10-CM | POA: Insufficient documentation

## 2013-10-21 LAB — COMPREHENSIVE METABOLIC PANEL
ALBUMIN: 3.9 g/dL (ref 3.5–5.2)
ALK PHOS: 57 U/L (ref 39–117)
ALT: 16 U/L (ref 0–53)
ANION GAP: 11 (ref 5–15)
AST: 17 U/L (ref 0–37)
BILIRUBIN TOTAL: 0.3 mg/dL (ref 0.3–1.2)
BUN: 11 mg/dL (ref 6–23)
CHLORIDE: 104 meq/L (ref 96–112)
CO2: 25 meq/L (ref 19–32)
Calcium: 9.3 mg/dL (ref 8.4–10.5)
Creatinine, Ser: 0.88 mg/dL (ref 0.50–1.35)
GFR calc Af Amer: >90 mL/min (ref >90)
GFR, EST NON AFRICAN AMERICAN: 88 mL/min — AB (ref >90)
GLUCOSE: 115 mg/dL — AB (ref 70–99)
POTASSIUM: 4.2 meq/L (ref 3.7–5.3)
Sodium: 140 mEq/L (ref 137–147)
Total Protein: 7.2 g/dL (ref 6.0–8.3)

## 2013-10-21 LAB — LACTATE DEHYDROGENASE: LDH: 168 U/L (ref 94–250)

## 2013-10-21 LAB — CBC WITH DIFFERENTIAL/PLATELET
BASOS PCT: 0 % (ref 0–1)
Basophils Absolute: 0 10*3/uL (ref 0.0–0.1)
Eosinophils Absolute: 0.2 10*3/uL (ref 0.0–0.7)
Eosinophils Relative: 4 % (ref 0–5)
HEMATOCRIT: 40.5 % (ref 39.0–52.0)
HEMOGLOBIN: 13.1 g/dL (ref 13.0–17.0)
LYMPHS ABS: 2.2 10*3/uL (ref 0.7–4.0)
LYMPHS PCT: 44 % (ref 12–46)
MCH: 30 pg (ref 26.0–34.0)
MCHC: 32.3 g/dL (ref 30.0–36.0)
MCV: 92.9 fL (ref 78.0–100.0)
MONOS PCT: 8 % (ref 3–12)
Monocytes Absolute: 0.4 10*3/uL (ref 0.1–1.0)
NEUTROS ABS: 2.3 10*3/uL (ref 1.7–7.7)
NEUTROS PCT: 44 % (ref 43–77)
Platelets: 163 10*3/uL (ref 150–400)
RBC: 4.36 MIL/uL (ref 4.22–5.81)
RDW: 13.7 % (ref 11.5–15.5)
WBC: 5.2 10*3/uL (ref 4.0–10.5)

## 2013-10-21 NOTE — Progress Notes (Signed)
Labs drawn for b57m,cmp,cbcd,ldh.

## 2013-10-24 ENCOUNTER — Encounter (HOSPITAL_COMMUNITY): Payer: Self-pay

## 2013-10-24 ENCOUNTER — Encounter (HOSPITAL_BASED_OUTPATIENT_CLINIC_OR_DEPARTMENT_OTHER): Payer: Federal, State, Local not specified - PPO

## 2013-10-24 VITALS — BP 140/83 | HR 72 | Temp 98.0°F | Resp 18 | Wt 220.0 lb

## 2013-10-24 DIAGNOSIS — C8590 Non-Hodgkin lymphoma, unspecified, unspecified site: Secondary | ICD-10-CM

## 2013-10-24 DIAGNOSIS — C8589 Other specified types of non-Hodgkin lymphoma, extranodal and solid organ sites: Secondary | ICD-10-CM

## 2013-10-24 DIAGNOSIS — Z87898 Personal history of other specified conditions: Secondary | ICD-10-CM

## 2013-10-24 LAB — BETA 2 MICROGLOBULIN, SERUM: Beta-2 Microglobulin: 1.57 mg/L (ref <=2.51)

## 2013-10-24 NOTE — Patient Instructions (Signed)
[facility] Discharge Instructions  RECOMMENDATIONS MADE BY THE CONSULTANT AND ANY TEST RESULTS WILL BE SENT TO YOUR REFERRING PHYSICIAN.  EXAM FINDINGS BY THE PHYSICIAN TODAY AND SIGNS OR SYMPTOMS TO REPORT TO CLINIC OR PRIMARY PHYSICIAN: you saw dr Bubba Hales today   Follow up in 6 months with the doctor and labs  Thank you for choosing [facility] to provide your oncology and hematology care.  To afford each patient quality time with our providers, please arrive at least 15 minutes before your scheduled appointment time.  With your help, our goal is to use those 15 minutes to complete the necessary work-up to ensure our physicians have the information they need to help with your evaluation and healthcare recommendations.    Effective January 1st, 2014, we ask that you re-schedule your appointment with our physicians should you arrive 10 or more minutes late for your appointment.  We strive to give you quality time with our providers, and arriving late affects you and other patients whose appointments are after yours.    Again, thank you for choosing [facility].  Our hope is that these requests will decrease the amount of time that you wait before being seen by our physicians.       _____________________________________________________________  Should you have questions after your visit to [facility] please contact our office at [phone] between the hours of 8:30 a.m. and 5:00 p.m.  Voicemails left after 4:30 p.m. will not be returned until the following business day.  For prescription refill requests, have your pharmacy contact our office with your prescription refill request.

## 2013-10-24 NOTE — Addendum Note (Signed)
Addended by: Elenor Legato on: 10/24/2013 12:59 PM   Modules accepted: Orders

## 2013-10-24 NOTE — Progress Notes (Signed)
STAR Program Physical Impairment and Functional Assessment Screening Tool  1. Are you having any pain, including headaches, joint pain, or muscle pain (upper body = OT; lower body = PT)?   [    ]  No   [    ]  Yes, but I hand this before my cancer diagnosis.   [    ]  Yes, this started after my diagnosis and is still a problem.   [ x   ]  Yes, this is new since my last visit. 2. Do your hands and/or feet feel numb or tingle (PT)?   [    ]  No   [    ]  Yes, but I hand this before my cancer diagnosis.   [  x  ]  Yes, this started after my diagnosis and is still a problem.   [    ]  Yes, this is new since my last visit. 3. Does any part of your body feel swollen or larger than usual (upper body = OT; lower body = PT)?   [ x   ]  No   [    ]  Yes, but I hand this before my cancer diagnosis.   [    ]  Yes, this started after my diagnosis and is still a problem.   [    ]  Yes, this is new since my last visit. 4. Are you so tired that you cannot do the things you want or need to do (PT or OT)?   [    ]  No   [    ]  Yes, but I hand this before my cancer diagnosis.   [  x  ]  Yes, this started after my diagnosis and is still a problem.   [    ]  Yes, this is new since my last visit. 5. Are you feeling weak or are you having trouble moving any part of your body (PT/OT)?   [    ]  No   [    ]  Yes, but I hand this before my cancer diagnosis.   [  x  ]  Yes, this started after my diagnosis and is still a problem.   [    ]  Yes, this is new since my last visit. 6. Are you having trouble concentrating, thinking, or remembering things (OT/ST)?   [    ]  No   [    ]  Yes, but I hand this before my cancer diagnosis.   [ x   ]  Yes, this started after my diagnosis and is still a problem.   [    ]  Yes, this is new since my last visit. 7. Are you having trouble moving around or feel like you might trip or fall (PT)?   [    ]  No   [    ]  Yes, but I hand this before my cancer diagnosis.   [  x  ]   Yes, this started after my diagnosis and is still a problem.   [    ]  Yes, this is new since my last visit. 8. Are you having trouble swallowing (ST)?   [ x   ]  No   [    ]  Yes, but I hand this before my cancer diagnosis.   [    ]  Yes, this started after  my diagnosis and is still a problem.   [    ]  Yes, this is new since my last visit. 9. Are you having trouble speaking (ST)?   [  x  ]  No   [    ]  Yes, but I hand this before my cancer diagnosis.   [    ]  Yes, this started after my diagnosis and is still a problem.   [    ]  Yes, this is new since my last visit. 10. Are you having trouble with going or getting to the bathroom (OT)?   [ x   ]  No   [    ]  Yes, but I hand this before my cancer diagnosis.   [    ]  Yes, this started after my diagnosis and is still a problem.   [    ]  Yes, this is new since my last visit. 11. Are you having trouble with your sexual function (OT)?   [  x  ]  No   [    ]  Yes, but I hand this before my cancer diagnosis.   [    ]  Yes, this started after my diagnosis and is still a problem.   [    ]  Yes, this is new since my last visit. 12. Are you having trouble lifting things, even just your arms (OT/PT)?   [x    ]  No   [    ]  Yes, but I hand this before my cancer diagnosis.   [    ]  Yes, this started after my diagnosis and is still a problem.   [    ]  Yes, this is new since my last visit. 42. Are you having trouble taking care of yourself as in dressing or bathing (OT)?   [ x   ]  No   [    ]  Yes, but I hand this before my cancer diagnosis.   [    ]  Yes, this started after my diagnosis and is still a problem.   [    ]  Yes, this is new since my last visit. 14. Are you having trouble with daily tasks like chores or shopping (OT)?   [    ]  No   [    ]  Yes, but I hand this before my cancer diagnosis.   [x    ]  Yes, this started after my diagnosis and is still a problem.   [    ]  Yes, this is new since my last visit. 15. Are you  having trouble driving (OT)?   [ x   ]  No   [    ]  Yes, but I hand this before my cancer diagnosis.   [    ]  Yes, this started after my diagnosis and is still a problem.   [    ]  Yes, this is new since my last visit. 30. Are you having trouble returning to work or completing your tasks at work (OT)?   [    ]  No   [    ]  Yes, but I hand this before my cancer diagnosis.   [    ]  Yes, this started after my diagnosis and is still a problem.   [    ]  Yes, this is new  since my last visit.  Other concerns:    Legend: OT = Occupational Therapy PT = Physical Therapy ST = Speech Therapy

## 2013-10-24 NOTE — Progress Notes (Signed)
[facility] OFFICE PROGRESS NOTE  PCP [name], MD [place] [address]  DIAGNOSIS: NON-HODGKIN'S LYMPHOMA  Lymphoma - Plan: CBC with Differential, Comprehensive metabolic panel, Lactate dehydrogenase, Beta 2 microglobulin, serum  CURRENT THERAPY:  Last seen 6 months ago for surveillance.   HEMATOLOGY/ONCOLOGY HX:  Mr. William Joseph is a 68 year old man who completed chemotherapy with Rituxan and chemotherapy and achieved a complete response. Initially chemotherapy consisted of EPOCH which was changed to CHOP. He did not receive radiation. Disease presented in the abdomen and staging evaluation determined that the patient had IIA disease. A PET scan in December of 2011 was without evidiense of residual disease.        MEDICAL HISTORY:  Past Medical History  Diagnosis Date  . Non Hodgkin's lymphoma   . Sleep apnea   . Anxiety   . Essential hypertension, benign   . History of cardiac catheterization     Normal coronaries March 2011  . Nonerosive nonspecific gastritis     EGD - Dr. Laural Golden  . Obsessive-compulsive disorder   . Palpitations     has NON-HODGKIN'S LYMPHOMA; ANXIETY; Essential hypertension, benign; SLEEP APNEA; Shortness of breath; Palpitations; and Dysthymia on his problem list.    ALLERGIES:  has No Known Allergies.  MEDICATIONS: has a current medication list which includes the following prescription(s): aspirin-caffeine, b complex vitamins, dutasteride, loratadine-pseudoephedrine, meloxicam, multiple vitamins-minerals, sodium chloride, tamsulosin, and tadalafil.  FAMILY HISTORY: family history includes Coronary artery disease in his mother; Dementia in his maternal grandfather; Diabetes in his father; Drug abuse in his other; Hypertension in his brother; Mental illness in his maternal uncle; Seizures in his cousin and maternal uncle. There is no history of ADD / ADHD, Alcohol abuse, Anxiety  disorder, Bipolar disorder, Depression, OCD, Paranoid behavior, Schizophrenia, Sexual abuse, or Physical abuse.  REVIEW OF SYSTEMS:    SINCE YOUR LAST VISIT Been diagnosed or treated for a new medical /surgical  problem or condition: No Any Recent Xrays or studies performed: No Any new prescription or OTC medications: No ECOG Perf Status: Fully active, able to carry on all pre-disease performance without restriction Problems sleeping: Yes Medications taken to help sleep: No How is your appetie: 100% normal Any Supplements: No Any trouble chewing or swallowing: No Any Nausea or Vomiting: No Any Bowel problems: No # Bowel Movements per week: 4 Any Urinary Issues: No. Had a prostate exam and possibly PSA  By his PCP. Any Cardiac Problems: No Any Respiratory Issues: Yes (SOB with activity and its hot) Any Neurological Issues: Yes:  tingling in fingers but ocasionaly in left arm position related. No squeezing pain or associated chest pain  Do you live alone: No Feelings hopelessness: No  Other than that discussed above is noncontributory.    PHYSICAL EXAMINATION:   weight is 220 lb (99.791 kg). His oral temperature is 98 F (36.7 C). His blood pressure is 140/83 and his pulse is 72. His respiration is 18.    GENERAL: alert, no distress and comfortable SKIN: skin color, texture, turgor are normal, no rashes or significant lesions. No cyanosis of digits and no clubbing.  EYES: PERLA; Conjunctiva are pink and non-injected, sclera clear OROPHARYNX: no exudate, no erythema on lips, buccal mucosa, or tongue. NECK: supple, thyroid normal size, non-tender, without nodularity. No masses LYMPH:  no palpable lymphadenopathy in the cervical, axillary or inguinal LUNGS: clear to auscultation and percussion with normal breathing effort  HEART: regular  rate & rhythm and no murmurs.  ABDOMEN: abdomen soft, non-tender and normal bowel sounds, no organomegaly or palpable  masses MUSCULOSKELETAL/EXTREMITIES: Range of motion normal.  NEURO: alert & oriented x 3 with fluent speech, no focal motor/sensory deficits. Reflexes absent in upper extremities and decreased in the lower extremities.   LABORATORY DATA: Lab on 10/21/2013  Component Date Value Ref Range Status  . WBC 10/21/2013 5.2  4.0 - 10.5 K/uL Final  . RBC 10/21/2013 4.36  4.22 - 5.81 MIL/uL Final  . Hemoglobin 10/21/2013 13.1  13.0 - 17.0 g/dL Final  . HCT 10/21/2013 40.5  39.0 - 52.0 % Final  . MCV 10/21/2013 92.9  78.0 - 100.0 fL Final  . MCH 10/21/2013 30.0  26.0 - 34.0 pg Final  . MCHC 10/21/2013 32.3  30.0 - 36.0 g/dL Final  . RDW 10/21/2013 13.7  11.5 - 15.5 % Final  . Platelets 10/21/2013 163  150 - 400 K/uL Final  . Neutrophils Relative % 10/21/2013 44  43 - 77 % Final  . Neutro Abs 10/21/2013 2.3  1.7 - 7.7 K/uL Final  . Lymphocytes Relative 10/21/2013 44  12 - 46 % Final  . Lymphs Abs 10/21/2013 2.2  0.7 - 4.0 K/uL Final  . Monocytes Relative 10/21/2013 8  3 - 12 % Final  . Monocytes Absolute 10/21/2013 0.4  0.1 - 1.0 K/uL Final  . Eosinophils Relative 10/21/2013 4  0 - 5 % Final  . Eosinophils Absolute 10/21/2013 0.2  0.0 - 0.7 K/uL Final  . Basophils Relative 10/21/2013 0  0 - 1 % Final  . Basophils Absolute 10/21/2013 0.0  0.0 - 0.1 K/uL Final  . Sodium 10/21/2013 140  137 - 147 mEq/L Final  . Potassium 10/21/2013 4.2  3.7 - 5.3 mEq/L Final  . Chloride 10/21/2013 104  96 - 112 mEq/L Final  . CO2 10/21/2013 25  19 - 32 mEq/L Final  . Glucose, Bld 10/21/2013 115* 70 - 99 mg/dL Final  . BUN 10/21/2013 11  6 - 23 mg/dL Final  . Creatinine, Ser 10/21/2013 0.88  0.50 - 1.35 mg/dL Final  . Calcium 10/21/2013 9.3  8.4 - 10.5 mg/dL Final  . Total Protein 10/21/2013 7.2  6.0 - 8.3 g/dL Final  . Albumin 10/21/2013 3.9  3.5 - 5.2 g/dL Final  . AST 10/21/2013 17  0 - 37 U/L Final  . ALT 10/21/2013 16  0 - 53 U/L Final  . Alkaline Phosphatase 10/21/2013 57  39 - 117 U/L Final  . Total  Bilirubin 10/21/2013 0.3  0.3 - 1.2 mg/dL Final  . GFR calc non Af Amer 10/21/2013 88* >90 mL/min Final  . GFR calc Af Amer 10/21/2013 >90  >90 mL/min Final   Comment: (NOTE)                          The eGFR has been calculated using the CKD EPI equation.                          This calculation has not been validated in all clinical situations.                          eGFR's persistently <90 mL/min signify possible Chronic Kidney  Disease.  . Anion gap 10/21/2013 11  5 - 15 Final  . LDH 10/21/2013 168  94 - 250 U/L Final  . Beta-2 Microglobulin 10/21/2013 1.57  <=2.51 mg/L Final   Performed at [facility]: No new results  RADIOGRAPHIC STUDIES: No results found.  ASSESSMENT:  1. Diffuse Large B-cell NHL diagnosed in 2005 who is clinically doing well and without new complaints since prior visit. He was successfully treated and his repeat blood work is within acceptable parameters       RECOMMENDATIONS:  1. Followup with hematology/oncology in 6 months with lab tests 2. Routine 6 month f/u with his PCP.    All questions were answered. The patient knows to call the clinic with any problems, questions or concerns. We can certainly see the patient much sooner if necessary.    Darrall Dears, MD 10/24/2013 12:02 PM .

## 2013-11-05 HISTORY — PX: GLAUCOMA SURGERY: SHX656

## 2013-11-22 ENCOUNTER — Ambulatory Visit (HOSPITAL_COMMUNITY)
Admission: RE | Admit: 2013-11-22 | Discharge: 2013-11-22 | Disposition: A | Discharge status: Home / Self Care | Payer: Federal, State, Local not specified - PPO | Source: Ambulatory Visit | Attending: Hematology | Admitting: Hematology

## 2013-11-22 DIAGNOSIS — R29898 Other symptoms and signs involving the musculoskeletal system: Secondary | ICD-10-CM

## 2013-11-22 DIAGNOSIS — R2689 Other abnormalities of gait and mobility: Secondary | ICD-10-CM

## 2013-11-22 NOTE — Evaluation (Signed)
Physical Therapy Evaluation  Patient Details  Name: William Joseph MRN: [id number] Date of Birth: 1946-03-17  Today's Date: 11/22/2013 Time: 1530-1615 PT Time Calculation (min): 45 min Charge:  Evaluation              Visit#: 1 of 8  Re-eval: 12/22/13 Assessment Diagnosis: fatigue  Authorization: [employer]     A Past Medical History:  Past Medical History  Diagnosis Date  . Non Hodgkin's lymphoma   . Sleep apnea   . Anxiety   . Essential hypertension, benign   . History of cardiac catheterization     Normal coronaries March 2011  . Nonerosive nonspecific gastritis     EGD - Dr. Laural Golden  . Obsessive-compulsive disorder   . Palpitations    Past Surgical History:  Past Surgical History  Procedure Laterality Date  . Tooth implant    . Port-a-cath placement    . Exploratory laparotomy    . Lymph node biopsy    . Esophagogastroduodenoscopy endoscopy N/A     Subjective Symptoms/Limitations Symptoms: Pt was dx with lymphoma in January 2005.  He took chemo from January 2005-June 2005 every three weeks.  He states that things are not the same any longer.  He states that the most significant item is that he is unable to use his hands, he states he also has significant memory loss.; he has some issues with his balance.  He states that he does not sleep well .   Pertinent History: lymphoma How long can you stand comfortably?: able to stand for about 15 minutes and then it feels like his legs are giving away on him.  How long can you walk comfortably?: unable to walk very far; about 15-20 mintues.  Pain Assessment Currently in Pain?: No/denies    Sensation/Coordination/Flexibility/Functional Tests Functional Tests Functional Tests: Fact-F 108/160 Functional Tests: VAS fatigue7/10; Functional Tests: VAS pain 4.3; VAS distress 5.66  Assessment RLE Strength Right Hip Flexion: 3-/5 Right Hip Extension: 5/5 Right Hip ABduction: 5/5 Right Knee Flexion: 4/5 LLE Strength Left  Hip Flexion: 3/5 Left Hip Extension: 3/5 Left Hip ABduction: 5/5 Left Knee Flexion: 4/5  Exercise/Treatments Mobility/Balance  Static Standing Balance Single Leg Stance - Right Leg: 7 Single Leg Stance - Left Leg: 10 Timed Up and Go Test TUG: Normal TUG Normal TUG (seconds): 10 (10 41/100)    Standing SLS: 3 x 10" max  Other Standing Knee Exercises: tandem stance x 20"    Supine Straight Leg Raises: 5 reps Prone  Hip Extension: 10 reps      Physical Therapy Assessment and Plan PT Assessment and Plan Clinical Impression Statement: Pt is a 68 yo male who has been cancer free for years but feels that he has never returned to his prior active state.  Examination demonstrates decreased balance and decreased LE.  Mr. Natasha Mead will benefit from skilled therapy to address these issues.  Pt will benefit from skilled therapeutic intervention in order to improve on the following deficits: Decreased activity tolerance;Decreased strength;Decreased balance Rehab Potential: Good PT Frequency: Min 2X/week PT Duration: 4 weeks PT Treatment/Interventions: Therapeutic activities;Therapeutic exercise;Balance training PT Plan: Go over [facility] Goals, steps with pt  (have pt increase steps by 10%), Begin TM, cybex strengthening and balance activities     Goals Home Exercise Program Pt/caregiver will Perform Home Exercise Program: For increased strengthening PT Short Term Goals Time to Complete Short Term Goals: 2 weeks PT Short Term Goal 1: Pt to be walking 15 minutes for better health  PT Short Term Goal 2: Pt to be able to stand for 10 minutes to wait in line at the grocery store with comfort PT Short Term Goal 3: Pt SLS to be improved to 20 seconds to have decreased episodes of imbalance PT Long Term Goals Time to Complete Long Term Goals: 4 weeks PT Long Term Goal 1: Pt to be walking for 30 minutes a day at least 4 days a week for improved health habits  PT Long Term Goal 2: Pt  to be able to stand for 20 minutes to be able to socialize  Long Term Goal 3: Pt to have no loss of balance in the past week  Problem List Patient Active Problem List   Diagnosis Date Noted  . Weakness of both legs 11/22/2013  . Unstable balance 11/22/2013  . Dysthymia 06/17/2012  . Shortness of breath 12/17/2011  . Palpitations 12/17/2011  . NON-HODGKIN'S LYMPHOMA 06/26/2009  . ANXIETY 06/26/2009  . Essential hypertension, benign 06/26/2009  . SLEEP APNEA 06/26/2009    PT Plan of Care PT Home Exercise Plan: given  GP    Tashayla Therien,CINDY 11/22/2013, 4:37 PM  Physician Documentation Your signature is required to indicate approval of the treatment plan as stated above.  Please sign and either send electronically or make a copy of this report for your files and return this physician signed original.   Please mark one 1.__approve of plan  2. ___approve of plan with the following conditions.   ______________________________                                                          _____________________ Physician Signature                                                                                                             Date

## 2013-12-01 ENCOUNTER — Inpatient Hospital Stay (HOSPITAL_COMMUNITY): Admission: RE | Admit: 2013-12-01 | Payer: Self-pay | Source: Ambulatory Visit

## 2013-12-01 ENCOUNTER — Ambulatory Visit (HOSPITAL_COMMUNITY): Payer: Self-pay | Admitting: Physical Therapy

## 2013-12-05 ENCOUNTER — Ambulatory Visit (HOSPITAL_COMMUNITY)
Admission: RE | Admit: 2013-12-05 | Payer: Federal, State, Local not specified - PPO | Source: Ambulatory Visit | Attending: Hematology | Admitting: Hematology

## 2013-12-05 ENCOUNTER — Telehealth (HOSPITAL_COMMUNITY): Payer: Self-pay

## 2013-12-06 ENCOUNTER — Ambulatory Visit (HOSPITAL_COMMUNITY): Payer: Federal, State, Local not specified - PPO | Attending: Hematology | Admitting: Physical Therapy

## 2013-12-08 ENCOUNTER — Ambulatory Visit (HOSPITAL_COMMUNITY): Payer: Federal, State, Local not specified - PPO | Admitting: Physical Therapy

## 2013-12-08 ENCOUNTER — Telehealth (HOSPITAL_COMMUNITY): Payer: Self-pay

## 2013-12-13 ENCOUNTER — Inpatient Hospital Stay (HOSPITAL_COMMUNITY): Admission: RE | Admit: 2013-12-13 | Payer: Self-pay | Source: Ambulatory Visit | Admitting: Physical Therapy

## 2013-12-15 ENCOUNTER — Ambulatory Visit (HOSPITAL_COMMUNITY): Payer: Federal, State, Local not specified - PPO | Admitting: Physical Therapy

## 2013-12-20 ENCOUNTER — Inpatient Hospital Stay (HOSPITAL_COMMUNITY)
Admission: RE | Admit: 2013-12-20 | Discharge: 2013-12-20 | Disposition: A | Discharge status: Home / Self Care | Payer: Self-pay | Source: Ambulatory Visit | Attending: Physical Therapy | Admitting: Physical Therapy

## 2013-12-20 NOTE — Progress Notes (Signed)
  Patient Details  Name: William Joseph MRN: [id number] Date of Birth: 1945/04/22  Today's Date: 12/20/2013    Pt has not shown for any appointments since Initial evaluation on  11/12/13.  Secretary has telephoned patient without return phone call.  Pt is discharged at this time due to noncompliance.  All appointments have been removed.  Teena Irani, PTA/CLT 12/20/2013, 10:33 AM

## 2013-12-22 ENCOUNTER — Ambulatory Visit (HOSPITAL_COMMUNITY): Payer: Self-pay | Admitting: Physical Therapy

## 2013-12-27 ENCOUNTER — Ambulatory Visit (HOSPITAL_COMMUNITY): Payer: Self-pay | Admitting: Physical Therapy

## 2013-12-29 ENCOUNTER — Ambulatory Visit (HOSPITAL_COMMUNITY): Payer: Self-pay | Admitting: Physical Therapy

## 2014-01-03 ENCOUNTER — Ambulatory Visit (HOSPITAL_COMMUNITY): Payer: Self-pay | Admitting: Physical Therapy

## 2014-01-05 ENCOUNTER — Ambulatory Visit (HOSPITAL_COMMUNITY): Payer: Self-pay | Admitting: Physical Therapy

## 2014-04-25 ENCOUNTER — Other Ambulatory Visit (HOSPITAL_COMMUNITY): Payer: Self-pay

## 2014-04-25 DIAGNOSIS — Z8572 Personal history of non-Hodgkin lymphomas: Secondary | ICD-10-CM

## 2014-04-26 ENCOUNTER — Encounter (HOSPITAL_COMMUNITY): Payer: Federal, State, Local not specified - PPO | Attending: Hematology & Oncology | Admitting: Hematology & Oncology

## 2014-04-26 ENCOUNTER — Encounter (HOSPITAL_BASED_OUTPATIENT_CLINIC_OR_DEPARTMENT_OTHER): Payer: Federal, State, Local not specified - PPO

## 2014-04-26 ENCOUNTER — Encounter (HOSPITAL_COMMUNITY): Payer: Self-pay | Admitting: Hematology & Oncology

## 2014-04-26 VITALS — BP 155/94 | HR 65 | Temp 98.4°F | Resp 20 | Wt 223.3 lb

## 2014-04-26 DIAGNOSIS — Z8572 Personal history of non-Hodgkin lymphomas: Secondary | ICD-10-CM

## 2014-04-26 DIAGNOSIS — C8590 Non-Hodgkin lymphoma, unspecified, unspecified site: Secondary | ICD-10-CM

## 2014-04-26 DIAGNOSIS — I10 Essential (primary) hypertension: Secondary | ICD-10-CM | POA: Insufficient documentation

## 2014-04-26 DIAGNOSIS — G4730 Sleep apnea, unspecified: Secondary | ICD-10-CM | POA: Insufficient documentation

## 2014-04-26 DIAGNOSIS — D649 Anemia, unspecified: Secondary | ICD-10-CM

## 2014-04-26 DIAGNOSIS — Z08 Encounter for follow-up examination after completed treatment for malignant neoplasm: Secondary | ICD-10-CM | POA: Insufficient documentation

## 2014-04-26 DIAGNOSIS — Z23 Encounter for immunization: Secondary | ICD-10-CM

## 2014-04-26 DIAGNOSIS — F419 Anxiety disorder, unspecified: Secondary | ICD-10-CM | POA: Insufficient documentation

## 2014-04-26 LAB — COMPREHENSIVE METABOLIC PANEL
ALK PHOS: 49 U/L (ref 39–117)
ALT: 16 U/L (ref 0–53)
AST: 18 U/L (ref 0–37)
Albumin: 4.1 g/dL (ref 3.5–5.2)
Anion gap: 5 (ref 5–15)
BUN: 16 mg/dL (ref 6–23)
CALCIUM: 9.2 mg/dL (ref 8.4–10.5)
CO2: 27 mmol/L (ref 19–32)
Chloride: 108 mEq/L (ref 96–112)
Creatinine, Ser: 1 mg/dL (ref 0.50–1.35)
GFR calc Af Amer: 89 mL/min — ABNORMAL LOW (ref >90)
GFR calc non Af Amer: 76 mL/min — ABNORMAL LOW (ref >90)
Glucose, Bld: 121 mg/dL — ABNORMAL HIGH (ref 70–99)
POTASSIUM: 4.3 mmol/L (ref 3.5–5.1)
SODIUM: 140 mmol/L (ref 135–145)
Total Bilirubin: 0.4 mg/dL (ref 0.3–1.2)
Total Protein: 7.1 g/dL (ref 6.0–8.3)

## 2014-04-26 LAB — CBC WITH DIFFERENTIAL/PLATELET
BASOS PCT: 0 % (ref 0–1)
Basophils Absolute: 0 10*3/uL (ref 0.0–0.1)
EOS ABS: 0.2 10*3/uL (ref 0.0–0.7)
Eosinophils Relative: 4 % (ref 0–5)
HCT: 38.9 % — ABNORMAL LOW (ref 39.0–52.0)
Hemoglobin: 12.1 g/dL — ABNORMAL LOW (ref 13.0–17.0)
Lymphocytes Relative: 46 % (ref 12–46)
Lymphs Abs: 2 10*3/uL (ref 0.7–4.0)
MCH: 29.6 pg (ref 26.0–34.0)
MCHC: 31.1 g/dL (ref 30.0–36.0)
MCV: 95.1 fL (ref 78.0–100.0)
Monocytes Absolute: 0.3 10*3/uL (ref 0.1–1.0)
Monocytes Relative: 7 % (ref 3–12)
NEUTROS PCT: 43 % (ref 43–77)
Neutro Abs: 1.9 10*3/uL (ref 1.7–7.7)
Platelets: 161 10*3/uL (ref 150–400)
RBC: 4.09 MIL/uL — ABNORMAL LOW (ref 4.22–5.81)
RDW: 14 % (ref 11.5–15.5)
WBC: 4.4 10*3/uL (ref 4.0–10.5)

## 2014-04-26 LAB — LACTATE DEHYDROGENASE: LDH: 148 U/L (ref 94–250)

## 2014-04-26 MED ORDER — INFLUENZA VAC SPLIT QUAD 0.5 ML IM SUSY
0.5000 mL | PREFILLED_SYRINGE | Freq: Once | INTRAMUSCULAR | Status: AC
  Administered 2014-04-26: 0.5 mL via INTRAMUSCULAR
  Filled 2014-04-26: qty 0.5

## 2014-04-26 NOTE — Patient Instructions (Addendum)
[facility]  Discharge Instructions:  Please call with any problems or concerns Please call if your headaches recur We will see you back again in one year, sooner if needed. _______________________________________________________________  Thank you for choosing [facility] to provide your oncology and hematology care.  To afford each patient quality time with our providers, please arrive at least 15 minutes before your scheduled appointment.  You need to re-schedule your appointment if you arrive 10 or more minutes late.  We strive to give you quality time with our providers, and arriving late affects you and other patients whose appointments are after yours.  Also, if you no show three or more times for appointments you may be dismissed from the clinic.  Again, thank you for choosing [facility] hope is that these requests will allow you access to exceptional care and in a timely manner. _______________________________________________________________  If you have questions after your visit, please contact our office at [phone] between the hours of 8:30 a.m. and 5:00 p.m. Voicemails left after 4:30 p.m. will not be returned until the following business day. _______________________________________________________________  For prescription refill requests, have your pharmacy contact our office. _______________________________________________________________  Recommendations made by the consultant and any test results will be sent to your referring physician. _______________________________________________________________ Fecal Occult Blood Test This is a test done on a stool specimen to screen for gastrointestinal bleeding, which may be an indicator of colon cancer Is is usually done as part of a routine examination, annually, after age 49 or as directed by your caregiver. The  fecal occult blood test (FOBT) checks for blood in your stool. Normally, there will not be enough blood lost through the gastrointestinal tract to turn an FOBT positive or for you to notice it visually in the form of bloody or dark, tarry stools. Any significant amount of blood being passed should be investigated.  A positive FOBT will tell your caregiver that you have bleeding occurring somewhere in your gastrointestinal tract. This blood loss could be due to ulcers, diverticulosis, bleeding polyps, inflammatory bowel disease, hemorrhoids, from swallowed blood due to bleeding gums or nosebleeds, or it could be due to benign or cancerous tumors. Anything that protrudes into the lumen (the empty space in the intestine), like a polyp or tumor, and is rubbed against by the fecal waste as it passes through has the potential to eventually bleed intermittently. Often this small amount of blood is the first, and sometimes the only, symptom of early colon cancer, making the FOBT a valuable screening tool. PREPARATION FOR TEST  You should not eat red meat within three days before testing. Other substances that could cause a false positive test result include fish, turnips, horseradish, and drugs such as colchicines and oxidizing drugs (for example, iodine and boric acid). Be sure to carefully follow your caregiver's instructions. With FOBT, your caregiver or laboratory will give you one or more test "cards." You collect a separate sample from three different stools, usually on consecutive days. Each stool sample should be collected into a clean container and should not be contaminated with urine or water. The slide is labeled with your name and the date; then, with an applicator stick, you apply a thin smear of stool onto each filter paper square/window contained on the card. Allow the filter paper to dry. Once it is dry, it is stable. Usually you will collect all of  the consecutive samples, and then return all of them  to your caregiver or laboratory at the same time, sometimes by mailing them. There are also over the counter tests which are dropped in your toilet. NORMAL FINDINGS   No occult blood within the stool.  The FOBT test is normally negative. A positive indicates either blood in the stool or an interfering substance. Multiple samples are done to: 1) catch intermittent bleeding; and 2) help rule out false positives. Ranges for normal findings may vary among different laboratories and hospitals. You should always check with your doctor after having lab work or other tests done to discuss the meaning of your test results and whether your values are considered within normal limits. MEANING OF TEST  Your caregiver will go over the test results with you and discuss the importance and meaning of your results, as well as treatment options and the need for additional tests if necessary. OBTAINING THE TEST RESULTS  It is your responsibility to obtain your test results. Ask the lab or department performing the test when and how you will get your results. Document Released: 04/18/2004 Document Revised: 06/16/2011 Document Reviewed: 03/03/2008 [facility] Patient Information 2015 [place], [place]. This information is not intended to replace advice given to you by your health care provider. Make sure you discuss any questions you have with your health care provider.

## 2014-04-26 NOTE — Progress Notes (Signed)
[facility] for ldh,b54m,cmp,cbcd

## 2014-04-26 NOTE — Progress Notes (Signed)
Hubbard Robinson Goltz presents today for injection per MD orders. Fluarix administered IM in left Upper Arm. Administration without incident. Patient tolerated well.

## 2014-04-26 NOTE — Progress Notes (Signed)
William Bogus, MD [place] [address]  Stage IIA DLBCL, treated first with [facility] then [facility] after second cycle PET/CT December 2011 was unremarkable Had C-scope in [place]  CURRENT THERAPY: Observation  INTERVAL HISTORY: William Joseph 69 y.o. male returns for follow-up of a previously diagnosed Stage IIA DLBCL. He has no major complaints today. He has headaches but started using a humidifier and they are gone.  He has intermittent chest pressure. Normal cardiac cath in 2011. He does not know what causes it. It is infrequent.  He has a CPAP but is unable to wear it.   MEDICAL HISTORY: Past Medical History  Diagnosis Date  . Non Hodgkin's lymphoma   . Sleep apnea   . Anxiety   . Essential hypertension, benign   . History of cardiac catheterization     Normal coronaries March 2011  . Nonerosive nonspecific gastritis     EGD - Dr. Laural Golden  . Obsessive-compulsive disorder   . Palpitations     has History of non-Hodgkin's lymphoma; ANXIETY; Essential hypertension, benign; SLEEP APNEA; Shortness of breath; Palpitations; Dysthymia; Weakness of both legs; and Unstable balance on his problem list.     No history exists.     has No Known Allergies.  We administered Influenza vac split quadrivalent PF.  SURGICAL HISTORY: Past Surgical History  Procedure Laterality Date  . Tooth implant    . Port-a-cath placement    . Exploratory laparotomy    . Lymph node biopsy    . Esophagogastroduodenoscopy endoscopy N/A   . Glaucoma surgery Bilateral 11/2013    didnt work    SOCIAL HISTORY: History   Social History  . Marital Status: Married    Spouse Name: N/A    Number of Children: N/A  . Years of Education: N/A   Occupational History  . Not on file.   Social History Main Topics  . Smoking status: Light Tobacco Smoker -- 1.00 packs/day for 1 years    Types: Cigars  . Smokeless tobacco: Never Used     Comment: Puffs on  cigars every so often.   . Alcohol Use: No  . Drug Use: No  . Sexual Activity: Not on file   Other Topics Concern  . Not on file   Social History Narrative    FAMILY HISTORY: Family History  Problem Relation Age of Onset  . Diabetes Father   . Coronary artery disease Mother   . Hypertension Brother   . Mental illness Maternal Uncle   . Dementia Maternal Grandfather   . Seizures Cousin   . ADD / ADHD Neg Hx   . Alcohol abuse Neg Hx   . Anxiety disorder Neg Hx   . Bipolar disorder Neg Hx   . Depression Neg Hx   . OCD Neg Hx   . Paranoid behavior Neg Hx   . Schizophrenia Neg Hx   . Sexual abuse Neg Hx   . Physical abuse Neg Hx   . Drug abuse Other   . Seizures Maternal Uncle     Review of Systems  Constitutional: Positive for malaise/fatigue. Negative for fever, chills and weight loss.  HENT: Negative.   Eyes: Negative.        Wears glasses  Respiratory: Positive for shortness of breath. Negative for cough, hemoptysis, sputum production and wheezing.   Cardiovascular: Negative.   Gastrointestinal: Negative.   Genitourinary: Negative.        No problems unless he  misses his prostate medications  Musculoskeletal: Positive for back pain. Negative for myalgias, joint pain, falls and neck pain.  Skin: Negative.   Neurological: Positive for tingling and weakness. Negative for dizziness, tremors, sensory change, speech change, focal weakness, seizures and loss of consciousness.  Endo/Heme/Allergies: Negative.   Psychiatric/Behavioral: Negative.     PHYSICAL EXAMINATION  ECOG PERFORMANCE STATUS: 0 - Asymptomatic  Filed Vitals:   04/26/14 0900  BP: 155/94  Pulse: 65  Temp: 98.4 F (36.9 C)  Resp: 20    Physical Exam  Constitutional: He is oriented to person, place, and time and well-developed, well-nourished, and in no distress.  HENT:  Head: Normocephalic and atraumatic.  Nose: Nose normal.  Mouth/Throat: Oropharynx is clear and moist. No oropharyngeal  exudate.  Eyes: Conjunctivae and EOM are normal. Pupils are equal, round, and reactive to light. Right eye exhibits no discharge. Left eye exhibits no discharge. No scleral icterus.  Neck: Normal range of motion. Neck supple. No tracheal deviation present. No thyromegaly present.  Cardiovascular: Normal rate, regular rhythm and normal heart sounds.  Exam reveals no gallop and no friction rub.   No murmur heard. Pulmonary/Chest: Effort normal and breath sounds normal. He has no wheezes. He has no rales.  Abdominal: Soft. Bowel sounds are normal. He exhibits no distension and no mass. There is no tenderness. There is no rebound and no guarding.  Musculoskeletal: Normal range of motion. He exhibits no edema.  Lymphadenopathy:    He has no cervical adenopathy.  Neurological: He is alert and oriented to person, place, and time. He has normal reflexes. No cranial nerve deficit. Gait normal. Coordination normal.  Skin: Skin is warm and dry. No rash noted.  Psychiatric: Mood, memory, affect and judgment normal.  Nursing note and vitals reviewed.   LABORATORY DATA:  CBC    Component Value Date/Time   WBC 4.4 04/26/2014 0924   RBC 4.09* 04/26/2014 0924   HGB 12.1* 04/26/2014 0924   HCT 38.9* 04/26/2014 0924   PLT 161 04/26/2014 0924   MCV 95.1 04/26/2014 0924   MCH 29.6 04/26/2014 0924   MCHC 31.1 04/26/2014 0924   RDW 14.0 04/26/2014 0924   LYMPHSABS 2.0 04/26/2014 0924   MONOABS 0.3 04/26/2014 0924   EOSABS 0.2 04/26/2014 0924   BASOSABS 0.0 04/26/2014 0924   CMP     Component Value Date/Time   NA 140 04/26/2014 0924   K 4.3 04/26/2014 0924   CL 108 04/26/2014 0924   CO2 27 04/26/2014 0924   GLUCOSE 121* 04/26/2014 0924   BUN 16 04/26/2014 0924   CREATININE 1.00 04/26/2014 0924   CALCIUM 9.2 04/26/2014 0924   PROT 7.1 04/26/2014 0924   ALBUMIN 4.1 04/26/2014 0924   AST 18 04/26/2014 0924   ALT 16 04/26/2014 0924   ALKPHOS 49 04/26/2014 0924   BILITOT 0.4 04/26/2014 0924    GFRNONAA 76* 04/26/2014 0924   GFRAA 89* 04/26/2014 0924     ASSESSMENT and THERAPY PLAN:    History of non-Hodgkin's lymphoma Pleasant 69 year old male with a remote history of diffuse large B-cell lymphoma. He remains without obvious recurrence. He had a Pneumovax prior to the age of 76, therefore we will arrange for the Prevnar vaccination. I advised him he will receive another Pneumovax next year. He is also requested a flu shot. There is no need currently for any additional imaging and we will continue with yearly observation.  He believes he had a colonoscopy in [place] many years ago. We will  try to obtain those records. If it is been more than 10 years we will arrange for a screening colonoscopy.     All questions were answered. The patient knows to call the clinic with any problems, questions or concerns. We can certainly see the patient much sooner if necessary.   Molli Hazard 05/01/2014

## 2014-04-27 ENCOUNTER — Encounter (HOSPITAL_BASED_OUTPATIENT_CLINIC_OR_DEPARTMENT_OTHER): Payer: Federal, State, Local not specified - PPO

## 2014-04-27 VITALS — BP 171/89 | HR 71 | Temp 98.2°F | Resp 18

## 2014-04-27 DIAGNOSIS — Z8572 Personal history of non-Hodgkin lymphomas: Secondary | ICD-10-CM

## 2014-04-27 DIAGNOSIS — Z23 Encounter for immunization: Secondary | ICD-10-CM

## 2014-04-27 MED ORDER — PNEUMOCOCCAL 13-VAL CONJ VACC IM SUSP
0.5000 mL | Freq: Once | INTRAMUSCULAR | Status: AC
  Administered 2014-04-27: 0.5 mL via INTRAMUSCULAR
  Filled 2014-04-27: qty 0.5

## 2014-04-27 NOTE — Progress Notes (Signed)
William Joseph presents today for injection per MD orders. Prevnar 13 administered IM in right Upper Arm. Administration without incident. Patient tolerated well.

## 2014-04-27 NOTE — Patient Instructions (Signed)
[place] at [facility] Discharge Instructions  RECOMMENDATIONS MADE BY THE CONSULTANT AND ANY TEST RESULTS WILL BE SENT TO YOUR REFERRING PHYSICIAN.  Prevnar 13 injection right upper arm as ordered. Return as scheduled.  Thank you for choosing [place] at [facility] to provide your oncology and hematology care.  To afford each patient quality time with our provider, please arrive at least 15 minutes before your scheduled appointment time.    You need to re-schedule your appointment should you arrive 10 or more minutes late.  We strive to give you quality time with our providers, and arriving late affects you and other patients whose appointments are after yours.  Also, if you no show three or more times for appointments you may be dismissed from the clinic at the providers discretion.     Again, thank you for choosing [facility].  Our hope is that these requests will decrease the amount of time that you wait before being seen by our physicians.       _____________________________________________________________  Should you have questions after your visit to [facility], please contact our office at [phone] between the hours of 8:30 a.m. and 4:30 p.m.  Voicemails left after 4:30 p.m. will not be returned until the following business day.  For prescription refill requests, have your pharmacy contact our office.

## 2014-04-28 LAB — BETA 2 MICROGLOBULIN, SERUM: BETA 2 MICROGLOBULIN: 1.72 mg/L (ref <=2.51)

## 2014-05-01 ENCOUNTER — Encounter (HOSPITAL_COMMUNITY): Payer: Self-pay | Admitting: Hematology & Oncology

## 2014-05-01 NOTE — Assessment & Plan Note (Signed)
Pleasant 69 year old male with a remote history of diffuse large B-cell lymphoma. He remains without obvious recurrence. He had a Pneumovax prior to the age of 15, therefore we will arrange for the Prevnar vaccination. I advised him he will receive another Pneumovax next year. He is also requested a flu shot. There is no need currently for any additional imaging and we will continue with yearly observation.  He believes he had a colonoscopy in [place] many years ago. We will try to obtain those records. If it is been more than 10 years we will arrange for a screening colonoscopy.

## 2014-08-24 ENCOUNTER — Ambulatory Visit (INDEPENDENT_AMBULATORY_CARE_PROVIDER_SITE_OTHER): Payer: Federal, State, Local not specified - PPO | Admitting: Otolaryngology

## 2014-08-24 DIAGNOSIS — H900 Conductive hearing loss, bilateral: Secondary | ICD-10-CM

## 2014-08-24 DIAGNOSIS — H6121 Impacted cerumen, right ear: Secondary | ICD-10-CM

## 2015-03-28 ENCOUNTER — Other Ambulatory Visit (HOSPITAL_COMMUNITY): Payer: Self-pay | Admitting: Respiratory Therapy

## 2015-03-28 DIAGNOSIS — G4730 Sleep apnea, unspecified: Secondary | ICD-10-CM

## 2015-04-10 ENCOUNTER — Other Ambulatory Visit: Payer: Self-pay | Admitting: Acute Care

## 2015-04-10 DIAGNOSIS — Z87891 Personal history of nicotine dependence: Secondary | ICD-10-CM

## 2015-04-13 ENCOUNTER — Ambulatory Visit (INDEPENDENT_AMBULATORY_CARE_PROVIDER_SITE_OTHER)
Admission: RE | Admit: 2015-04-13 | Discharge: 2015-04-13 | Disposition: A | Discharge status: Home / Self Care | Payer: Federal, State, Local not specified - PPO | Source: Ambulatory Visit | Attending: Acute Care | Admitting: Acute Care

## 2015-04-13 ENCOUNTER — Ambulatory Visit (INDEPENDENT_AMBULATORY_CARE_PROVIDER_SITE_OTHER): Payer: Federal, State, Local not specified - PPO | Admitting: Acute Care

## 2015-04-13 ENCOUNTER — Encounter: Payer: Self-pay | Admitting: Acute Care

## 2015-04-13 DIAGNOSIS — Z87891 Personal history of nicotine dependence: Secondary | ICD-10-CM | POA: Diagnosis not present

## 2015-04-13 NOTE — Progress Notes (Signed)
Shared Decision Making Visit Lung Cancer Screening Program [id number])   Eligibility:  Age 70 y.o.  Pack Years Smoking History Calculation 40 pack year (# packs/per year x # years smoked)  Recent History of coughing up blood  no  Unexplained weight loss? no ( >Than 15 pounds within the last 6 months )  Prior History Lung / other cancer : Non-Hodgkins Lymphoma in 2005 (Diagnosis within the last 5 years already requiring surveillance chest CT Scans).  Smoking Status Former Smoker  Former Smokers: Years since quit: 12 years  Quit Date: 04/08/2003  Visit Components:  Discussion included one or more decision making aids. yes  Discussion included risk/benefits of screening. yes  Discussion included potential follow up diagnostic testing for abnormal scans. yes  Discussion included meaning and risk of over diagnosis. yes  Discussion included meaning and risk of False Positives. yes  Discussion included meaning of total radiation exposure. yes  Counseling Included:  Importance of adherence to annual lung cancer LDCT screening. yes  Impact of comorbidities on ability to participate in the program. yes  Ability and willingness to under diagnostic treatment. yes  Smoking Cessation Counseling:  Current Smokers:   Discussed importance of smoking cessation. NA, former smoker  Information about tobacco cessation classes and interventions provided to patient. NA former smoker  Patient provided with "ticket" for LDCT Scan. yes  Symptomatic Patient. no  Counseling  Diagnosis Code: Tobacco Use Z72.0  Asymptomatic Patient yes  Counseling  Former Smokers:   Discussed the importance of maintaining cigarette abstinence. yes  Diagnosis Code: Personal History of Nicotine Dependence. [id number]  Information about tobacco cessation classes and interventions provided to patient. Yes  Patient provided with "ticket" for LDCT Scan. yes  Written Order for Lung Cancer Screening with  LDCT placed in Epic. Yes (CT Chest Lung Cancer Screening Low Dose W/O CM) [id number] Z12.2-Screening of respiratory organs Z87.891-Personal history of nicotine dependence  I spent 15 minutes of face to face time with Mr. And Mrs. Deignan discussing the risks and benefits of lung cancer screening. We viewed a power point together that explained in detail the above noted topics. We took the time to pause the power point at intervals to allow for questions to be asked and answered to ensure understanding. We discussed that he had taken the single most powerful action possible to decrease his risk of developing lung cancer when he quit smoking. I counseled him to remain smoke free, and to contact me if he ever had the desire to smoke again so that I can provide resources and tools to help support the effort to remain smoke free. He did say that he has smoked an occasional cigar, but that he does not inhale. I told him he needed to no longer smoke at all if possible. We discussed the time and location of the scan, and that either Christine or I will call with the results within  24-48 hours of receiving them. Mr. Medal has my card and contact information in the event he needs to speak with me, in addition to a copy of the power point we reviewed as a resource. He  verbalized understanding of all of the above and had no further questions or concerns upon leaving the office.   Magdalen Spatz, NP

## 2015-04-26 ENCOUNTER — Ambulatory Visit (HOSPITAL_COMMUNITY): Payer: Self-pay | Admitting: Hematology & Oncology

## 2015-04-26 ENCOUNTER — Encounter (HOSPITAL_COMMUNITY): Payer: Federal, State, Local not specified - PPO | Attending: Hematology & Oncology

## 2015-04-26 ENCOUNTER — Encounter (HOSPITAL_COMMUNITY): Payer: Self-pay | Admitting: Hematology & Oncology

## 2015-04-26 ENCOUNTER — Encounter (HOSPITAL_BASED_OUTPATIENT_CLINIC_OR_DEPARTMENT_OTHER): Payer: Federal, State, Local not specified - PPO | Admitting: Hematology & Oncology

## 2015-04-26 VITALS — BP 149/73 | HR 69 | Temp 97.9°F | Resp 20 | Wt 219.3 lb

## 2015-04-26 DIAGNOSIS — F341 Dysthymic disorder: Secondary | ICD-10-CM

## 2015-04-26 DIAGNOSIS — R079 Chest pain, unspecified: Secondary | ICD-10-CM

## 2015-04-26 DIAGNOSIS — Z8572 Personal history of non-Hodgkin lymphomas: Secondary | ICD-10-CM

## 2015-04-26 DIAGNOSIS — R0602 Shortness of breath: Secondary | ICD-10-CM | POA: Diagnosis not present

## 2015-04-26 DIAGNOSIS — R0789 Other chest pain: Secondary | ICD-10-CM

## 2015-04-26 LAB — COMPREHENSIVE METABOLIC PANEL
ALK PHOS: 52 U/L (ref 38–126)
ALT: 17 U/L (ref 17–63)
ANION GAP: 7 (ref 5–15)
AST: 19 U/L (ref 15–41)
Albumin: 4.1 g/dL (ref 3.5–5.0)
BILIRUBIN TOTAL: 0.6 mg/dL (ref 0.3–1.2)
BUN: 17 mg/dL (ref 6–20)
CALCIUM: 9.5 mg/dL (ref 8.9–10.3)
CO2: 24 mmol/L (ref 22–32)
Chloride: 109 mmol/L (ref 101–111)
Creatinine, Ser: 0.98 mg/dL (ref 0.61–1.24)
GFR calc Af Amer: >60 mL/min (ref >60)
GFR calc non Af Amer: >60 mL/min (ref >60)
Glucose, Bld: 101 mg/dL — ABNORMAL HIGH (ref 65–99)
Potassium: 4.2 mmol/L (ref 3.5–5.1)
Sodium: 140 mmol/L (ref 135–145)
TOTAL PROTEIN: 7.2 g/dL (ref 6.5–8.1)

## 2015-04-26 LAB — CBC
HEMATOCRIT: 40.9 % (ref 39.0–52.0)
HEMOGLOBIN: 13 g/dL (ref 13.0–17.0)
MCH: 29.7 pg (ref 26.0–34.0)
MCHC: 31.8 g/dL (ref 30.0–36.0)
MCV: 93.4 fL (ref 78.0–100.0)
Platelets: 162 10*3/uL (ref 150–400)
RBC: 4.38 MIL/uL (ref 4.22–5.81)
RDW: 13.8 % (ref 11.5–15.5)
WBC: 5.7 10*3/uL (ref 4.0–10.5)

## 2015-04-26 NOTE — Progress Notes (Signed)
William Bogus, MD [place] [address]  Stage IIA DLBCL, treated first with [facility] then [facility] after second cycle PET/CT December 2011 was unremarkable Had C-scope in [place]  CURRENT THERAPY: Observation  INTERVAL HISTORY: William Joseph 70 y.o. male returns for follow-up of a previously diagnosed Stage IIA DLBCL.   William Joseph is accompanied by his wife today. The patient is aware that he has not had a colonoscopy in a long time. His wife reports that he was previously given stool cards to complete but never did.  The patient and his wife recently got back from a trip to [place] for their anniversary. He has been feeling well and eating well. He typically takes a nap in the middle of the day.  Since about November, at night he has been getting very congested and feels pressure in his sinuses to the back of his head. He began taking Claritin D which did not help. He took an over the counter medication in [place] which resolved these issues. He attributes this congestion to turning the heat on in the house.   Occasionally he has issues where he cannot talk and breath at the same time.  He spoke with Dr. Luan Pulling about this and this is what prompted the lung cancer screening. A sleep study was also mentioned. When asked how frequently he exercises, he admits that he does not do much. He admits that he intermittently has pressure in his chest. He also reports that sometimes his left chest swells to become larger than the right. He has a family history of cardiac issues on his mother's side. He has a CPAP but is unable to wear it. He has a history of a normal cardiac cath in 2011.   MEDICAL HISTORY: Past Medical History  Diagnosis Date  . Non Hodgkin's lymphoma ([place])   . Sleep apnea   . Anxiety   . Essential hypertension, benign   . History of cardiac catheterization     Normal coronaries March 2011  . Nonerosive  nonspecific gastritis     EGD - Dr. Laural Golden  . Obsessive-compulsive disorder   . Palpitations     has History of non-Hodgkin's lymphoma; ANXIETY; Essential hypertension, benign; SLEEP APNEA; Shortness of breath; Palpitations; Dysthymia; Weakness of both legs; and Unstable balance on his problem list.     No history exists.     has No Known Allergies.  We administered Influenza vac split quadrivalent PF.  SURGICAL HISTORY: Past Surgical History  Procedure Laterality Date  . Tooth implant    . Port-a-cath placement    . Exploratory laparotomy    . Lymph node biopsy    . Esophagogastroduodenoscopy endoscopy N/A   . Glaucoma surgery Bilateral 11/2013    didnt work    SOCIAL HISTORY: Social History   Social History  . Marital Status: Married    Spouse Name: N/A  . Number of Children: N/A  . Years of Education: N/A   Occupational History  . Not on file.   Social History Main Topics  . Smoking status: Light Tobacco Smoker -- 1.00 packs/day for 1 years    Types: Cigars  . Smokeless tobacco: Never Used     Comment: Puffs on cigars every so often.   . Alcohol Use: No  . Drug Use: No  . Sexual Activity: Not on file   Other Topics Concern  . Not on file   Social History Narrative  FAMILY HISTORY: Family History  Problem Relation Age of Onset  . Diabetes Father   . Coronary artery disease Mother   . Hypertension Brother   . Mental illness Maternal Uncle   . Dementia Maternal Grandfather   . Seizures Cousin   . ADD / ADHD Neg Hx   . Alcohol abuse Neg Hx   . Anxiety disorder Neg Hx   . Bipolar disorder Neg Hx   . Depression Neg Hx   . OCD Neg Hx   . Paranoid behavior Neg Hx   . Schizophrenia Neg Hx   . Sexual abuse Neg Hx   . Physical abuse Neg Hx   . Drug abuse Other   . Seizures Maternal Uncle     Review of Systems  Constitutional: Negative for fever, chills and weight loss.  HENT: Positive for congestion.        Sinus headache  Eyes: Negative.         Wears glasses  Respiratory: Positive for shortness of breath. Negative for cough, hemoptysis, sputum production and wheezing.   Cardiovascular: Negative.        Occasional chest pressure  Gastrointestinal: Negative.   Genitourinary: Negative.        No problems unless he misses his prostate medications  Musculoskeletal: Positive for back pain. Negative for myalgias, joint pain, falls and neck pain.  Skin: Negative.   Neurological: Positive for tingling and headaches. Negative for dizziness, tremors, sensory change, speech change, focal weakness, seizures and loss of consciousness.  Endo/Heme/Allergies: Negative.   Psychiatric/Behavioral: Negative.     PHYSICAL EXAMINATION  ECOG PERFORMANCE STATUS: 0 - Asymptomatic  Filed Vitals:   04/26/15 1051  BP: 149/73  Pulse: 69  Temp: 97.9 F (36.6 C)  Resp: 20    Physical Exam  Constitutional: He is oriented to person, place, and time and well-developed, well-nourished, and in no distress.  HENT:  Head: Normocephalic and atraumatic.  Nose: Nose normal.  Mouth/Throat: Oropharynx is clear and moist. No oropharyngeal exudate.  Eyes: Conjunctivae and EOM are normal. Pupils are equal, round, and reactive to light. Right eye exhibits no discharge. Left eye exhibits no discharge. No scleral icterus.  Neck: Normal range of motion. Neck supple. No tracheal deviation present. No thyromegaly present.  Cardiovascular: Normal rate, regular rhythm and normal heart sounds.  Exam reveals no gallop and no friction rub.   No murmur heard. Pulmonary/Chest: Effort normal and breath sounds normal. He has no wheezes. He has no rales.  Abdominal: Soft. Bowel sounds are normal. He exhibits no distension and no mass. There is no tenderness. There is no rebound and no guarding.  Musculoskeletal: Normal range of motion. He exhibits no edema.  Lymphadenopathy:    He has no cervical adenopathy.  Neurological: He is alert and oriented to person, place, and  time. He has normal reflexes. No cranial nerve deficit. Gait normal. Coordination normal.  Skin: Skin is warm and dry. No rash noted.  Psychiatric: Mood, memory, affect and judgment normal.  Nursing note and vitals reviewed.   LABORATORY DATA: I have reviewed the data as listed. CBC    Component Value Date/Time   WBC 5.7 04/26/2015 0935   RBC 4.38 04/26/2015 0935   HGB 13.0 04/26/2015 0935   HCT 40.9 04/26/2015 0935   PLT 162 04/26/2015 0935   MCV 93.4 04/26/2015 0935   MCH 29.7 04/26/2015 0935   MCHC 31.8 04/26/2015 0935   RDW 13.8 04/26/2015 0935   LYMPHSABS 2.0 04/26/2014 [id number]  MONOABS 0.3 04/26/2014 0924   EOSABS 0.2 04/26/2014 0924   BASOSABS 0.0 04/26/2014 0924   CMP     Component Value Date/Time   NA 140 04/26/2015 0935   K 4.2 04/26/2015 0935   CL 109 04/26/2015 0935   CO2 24 04/26/2015 0935   GLUCOSE 101* 04/26/2015 0935   BUN 17 04/26/2015 0935   CREATININE 0.98 04/26/2015 0935   CALCIUM 9.5 04/26/2015 0935   PROT 7.2 04/26/2015 0935   ALBUMIN 4.1 04/26/2015 0935   AST 19 04/26/2015 0935   ALT 17 04/26/2015 0935   ALKPHOS 52 04/26/2015 0935   BILITOT 0.6 04/26/2015 0935   GFRNONAA >60 04/26/2015 0935   GFRAA >60 04/26/2015 0935   RADIOLOGY: I have personally reviewed the radiological images as listed and agreed with the findings in the report. CLINICAL DATA: 70 year old with 40 pack-year history of smoking. Lung cancer screening.  EXAM: CT CHEST WITHOUT CONTRAST  TECHNIQUE: Multidetector CT imaging of the chest was performed following the standard protocol without IV contrast.  COMPARISON: PET-CT FROM 04/03/2010  FINDINGS: Mediastinum / Lymph Nodes: There is no axillary lymphadenopathy. No mediastinal lymphadenopathy. There is no hilar lymphadenopathy. The heart size is normal. No pericardial effusion. Coronary artery calcification is noted. The esophagus has normal imaging features.  Lungs / Pleura: Underlying changes of  centrilobular emphysema. Bronchial wall thickening is associated. Scattered bilateral pulmonary nodules are evident. Dominant nodule is a subtle left upper lobe ground-glass opacity with a volume derived equivalent diameter of 9.6 mm (see image 150 series 3). No focal airspace consolidation. No pulmonary edema or pleural effusion.  Upper Abdomen: 5.3 cm water density structure in the dome of the left liver has progressed from 3.8 cm on the previous study and has been present since at least 02/08/2008. This is likely a cyst.  MSK / Soft Tissues: Bone windows reveal no worrisome lytic or sclerotic osseous lesions.  IMPRESSION: 1. Lung-RADS Category 2, benign appearance or behavior. Continue annual screening with low-dose chest CT without contrast in 12 months. 2. Coronary artery atherosclerosis. 3. 5.3 cm cyst in the dome of the left liver.   Electronically Signed  By: Misty Stanley M.D.  On: 04/16/2015 09:29  ASSESSMENT and THERAPY PLAN:   Stage IIA DLBCL, treated first with [facility] then [facility] after second cycle PET/CT December 2011 was unremarkable History of sleep apnea  I referred the patient for a screening colonoscopy.   He currently has no evidence of a recurrent lymphoma. He is doing well overall. He has had ongoing issue with chest pain and shortness of breath. I have referred the patient for a treadmill stress test due to occasional chest pressure. Encouraged the patient to increase his activity and to begin walking regularly.  Mr. Imel has been following closely with Dr. Luan Pulling about his recent respiratory issues.  He will return for routine follow-up in one year.  Orders Placed This Encounter  Procedures  . CBC with Differential    Standing Status: Future     Number of Occurrences:      Standing Expiration Date: 09/23/2016  . Comprehensive metabolic panel    Standing Status: Future     Number of Occurrences:      Standing Expiration Date:  09/23/2016  . Exercise Tolerance Test    Standing Status: Future     Number of Occurrences:      Standing Expiration Date: 07/24/2016    Order Specific Question:  Where should this test be performed  Answer:  [facility]    Order Specific Question:  Stress with Dobutamine or Treadmill with exercise?    Answer:  Treadmill w/ exercise    Order Specific Question:  Is patient able to ambulate on a treadmill?    Answer:  Yes   All questions were answered. The patient knows to call the clinic with any problems, questions or concerns. We can certainly see the patient much sooner if necessary.  This document serves as a record of services personally performed by Ancil Linsey, MD. It was created on her behalf by Arlyce Harman, a trained medical scribe. The creation of this record is based on the scribe's personal observations and the provider's statements to them. This document has been checked and approved by the attending provider.  I have reviewed the above documentation for accuracy and completeness, and I agree with the above.  Molli Hazard, MD  04/26/2015

## 2015-04-26 NOTE — Patient Instructions (Addendum)
[place] at [facility] [place] Discharge Instructions  RECOMMENDATIONS MADE BY THE CONSULTANT AND ANY TEST RESULTS WILL BE SENT TO YOUR REFERRING PHYSICIAN.   Exam completed by Dr Whitney Muse today Referral to GI for screening colonoscopy, they will call you with this appt  Referral for treadmill stress test, cardiology, they will call you with this appt  Return to see the doctor in 1 year with labs Please call the clinic if you have any questions or concerns    Thank you for choosing [place] at [facility] to provide your oncology and hematology care.  To afford each patient quality time with our provider, please arrive at least 15 minutes before your scheduled appointment time.   Beginning January 23rd 2017 lab work for the [facility] will be done in the  Main lab at [facility] on 1st floor. If you have a lab appointment with the [facility] please come in thru the  Main Entrance and check in at the main information desk  You need to re-schedule your appointment should you arrive 10 or more minutes late.  We strive to give you quality time with our providers, and arriving late affects you and other patients whose appointments are after yours.  Also, if you no show three or more times for appointments you may be dismissed from the clinic at the providers discretion.     Again, thank you for choosing [facility].  Our hope is that these requests will decrease the amount of time that you wait before being seen by our physicians.       _____________________________________________________________  Should you have questions after your visit to [facility], please contact our office at [phone] between the hours of 8:30 a.m. and 4:30 p.m.  Voicemails left after 4:30 p.m. will not be returned until the following business day.  For prescription refill requests, have your pharmacy contact our office.

## 2015-05-03 ENCOUNTER — Encounter: Payer: Self-pay | Admitting: Cardiovascular Disease

## 2015-05-03 ENCOUNTER — Encounter: Payer: Self-pay | Admitting: *Deleted

## 2015-05-03 ENCOUNTER — Ambulatory Visit (INDEPENDENT_AMBULATORY_CARE_PROVIDER_SITE_OTHER): Payer: Federal, State, Local not specified - PPO | Admitting: Cardiovascular Disease

## 2015-05-03 VITALS — BP 142/82 | HR 67 | Ht 74.0 in | Wt 222.0 lb

## 2015-05-03 DIAGNOSIS — R5383 Other fatigue: Secondary | ICD-10-CM

## 2015-05-03 DIAGNOSIS — I10 Essential (primary) hypertension: Secondary | ICD-10-CM | POA: Diagnosis not present

## 2015-05-03 DIAGNOSIS — R0609 Other forms of dyspnea: Secondary | ICD-10-CM

## 2015-05-03 DIAGNOSIS — I2510 Atherosclerotic heart disease of native coronary artery without angina pectoris: Secondary | ICD-10-CM | POA: Diagnosis not present

## 2015-05-03 NOTE — Patient Instructions (Signed)
Your physician has requested that you have an exercise stress myoview. For further information please visit [email or web address]. Please follow instruction sheet, as given. Office will contact with results via phone or letter.   Continue all current medications. Follow up in  6 weeks.

## 2015-05-03 NOTE — Progress Notes (Signed)
Patient ID: William Joseph, male   DOB: [birth year], 70 y.o.   MRN: [id number]       CARDIOLOGY CONSULT NOTE  Patient ID: William Joseph MRN: [id number] DOB/AGE: [birth year] 70 y.o.  Admit date: (Not on file) Primary Physician [name], MD  Reason for Consultation: stress test  HPI: 70 yr old male with h/o normal cardiac catheterization in 2011 and most recently evaluated by Dr. Domenic Polite in 2013. Has a h/o palpitations.  Echo 12/2011 showed normal LV systolic function, EF 0000000. Holter showed NSR with rare PAC's/PVC's and very short bursts of PSVT and possibly atrial fibrillation.  ECG today shows sinus rhythm with nonspecific T wave abnormalities.  He follows with Dr. Luan Pulling and has been complaining of exertional dyspnea. Ongoing for 6 months, but more noticeable lately. Denies chest tightness and leg swelling. Has occasional palpitations. Loses his breath when talking for long periods.  Chest CT 04/16/15 showed coronary artery calcifications.  He and his wife have both been experiencing more shortness of breath lately. 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  Fam: Mother had MI in her 37's.  No Known Allergies  Current Outpatient Prescriptions  Medication Sig Dispense Refill  . Aspirin-Caffeine (BC FAST PAIN RELIEF PO) Take 1 packet by mouth daily as needed (pain). Reported on 04/26/2015    . dutasteride (AVODART) 0.5 MG capsule Take 0.5 mg by mouth daily.      Marland Kitchen loratadine-pseudoephedrine (CLARITIN-D 24-HOUR) 10-240 MG per 24 hr tablet Take 1 tablet by mouth daily.    . Multiple Vitamins-Minerals (MULTIVITAMIN GUMMIES ADULT PO) Take 1 capsule by mouth daily.     . sodium chloride (OCEAN) 0.65 % nasal spray Place 1 spray into the nose as needed. Reported on 04/26/2015    . tadalafil (CIALIS) 5 MG tablet Take 5 mg by mouth daily.     . Tamsulosin HCl (FLOMAX) 0.4 MG CAPS Take 0.4 mg by mouth daily.       No current  facility-administered medications for this visit.    Past Medical History  Diagnosis Date  . Non Hodgkin's lymphoma ([place])   . Sleep apnea   . Anxiety   . Essential hypertension, benign   . History of cardiac catheterization     Normal coronaries March 2011  . Nonerosive nonspecific gastritis     EGD - Dr. Laural Golden  . Obsessive-compulsive disorder   . Palpitations     Past Surgical History  Procedure Laterality Date  . Tooth implant    . Port-a-cath placement    . Exploratory laparotomy    . Lymph node biopsy    . Esophagogastroduodenoscopy endoscopy N/A   . Glaucoma surgery Bilateral 11/2013    didnt work    Social History   Social History  . Marital Status: Married    Spouse Name: N/A  . Number of Children: N/A  . Years of Education: N/A   Occupational History  . Not on file.   Social History Main Topics  . Smoking status: Light Tobacco Smoker -- 1.00 packs/day for 1 years    Types: Cigars  . Smokeless tobacco: Never Used     Comment: Puffs on cigars every so often.   . Alcohol Use: No  . Drug Use: No  . Sexual Activity: Not on file   Other Topics Concern  . Not on file   Social History Narrative       Prior to Admission medications   Medication Sig Start  Date End Date Taking? Authorizing Provider  Aspirin-Caffeine (BC FAST PAIN RELIEF PO) Take 1 packet by mouth daily as needed (pain). Reported on 04/26/2015   Yes Historical Provider, MD  dutasteride (AVODART) 0.5 MG capsule Take 0.5 mg by mouth daily.     Yes Historical Provider, MD  loratadine-pseudoephedrine (CLARITIN-D 24-HOUR) 10-240 MG per 24 hr tablet Take 1 tablet by mouth daily.   Yes Historical Provider, MD  Multiple Vitamins-Minerals (MULTIVITAMIN GUMMIES ADULT PO) Take 1 capsule by mouth daily.    Yes Historical Provider, MD  sodium chloride (OCEAN) 0.65 % nasal spray Place 1 spray into the nose as needed. Reported on 04/26/2015   Yes Historical Provider, MD  tadalafil (CIALIS) 5 MG tablet Take  5 mg by mouth daily.    Yes Historical Provider, MD  Tamsulosin HCl (FLOMAX) 0.4 MG CAPS Take 0.4 mg by mouth daily.     Yes Historical Provider, MD     Review of systems complete and found to be negative unless listed above in HPI     Physical exam Blood pressure 142/82, pulse 67, height 6\' 2"  (1.88 m), weight 222 lb (100.699 kg), SpO2 98 %. General: NAD Neck: No JVD, no thyromegaly or thyroid nodule.  Lungs: Clear to auscultation bilaterally with normal respiratory effort. CV: Nondisplaced PMI. Regular rate and rhythm, normal S1/S2, no S3/S4, no murmur.  No peripheral edema.  No carotid bruit.  Normal pedal pulses.  Abdomen: Soft, nontender, no hepatosplenomegaly, no distention.  Skin: Intact without lesions or rashes.  Neurologic: Alert and oriented x 3.  Psych: Normal affect. Extremities: No clubbing or cyanosis.  HEENT: Normal.   ECG: Most recent ECG reviewed.  Labs:   Lab Results  Component Value Date   WBC 5.7 04/26/2015   HGB 13.0 04/26/2015   HCT 40.9 04/26/2015   MCV 93.4 04/26/2015   PLT 162 04/26/2015    Recent Labs Lab 04/26/15 0935  NA 140  K 4.2  CL 109  CO2 24  BUN 17  CREATININE 0.98  CALCIUM 9.5  PROT 7.2  BILITOT 0.6  ALKPHOS 52  ALT 17  AST 19  GLUCOSE 101*   Lab Results  Component Value Date   CKTOTAL 156 06/14/2009   CKMB 1.6 06/14/2009   TROPONINI <0.30 07/28/2012   No results found for: CHOL No results found for: HDL No results found for: LDLCALC No results found for: TRIG No results found for: CHOLHDL No results found for: LDLDIRECT       Studies: No results found.  ASSESSMENT AND PLAN:  1. Exertional dyspnea, fatigue, and coronary artery calcifications: Symptoms are both typical and atypical in character with respect to ischemic heart disease. Family history significant for premature CAD. Will obtain an exercise Cardiolite stress test. Symptoms may be indicative of dust exposure due to home remodeling  (asbestos?).  Dispo: f/u 6 weeks.   Signed: Kate Sable, M.D., F.A.C.C.  05/03/2015, 8:35 AM

## 2015-05-10 ENCOUNTER — Inpatient Hospital Stay (HOSPITAL_COMMUNITY): Admission: RE | Admit: 2015-05-10 | Payer: Self-pay | Source: Ambulatory Visit

## 2015-05-10 ENCOUNTER — Encounter (HOSPITAL_COMMUNITY)
Admission: RE | Admit: 2015-05-10 | Discharge: 2015-05-10 | Disposition: A | Discharge status: Home / Self Care | Payer: Federal, State, Local not specified - PPO | Source: Ambulatory Visit | Attending: Cardiovascular Disease | Admitting: Cardiovascular Disease

## 2015-05-10 ENCOUNTER — Encounter (HOSPITAL_COMMUNITY): Payer: Self-pay

## 2015-05-10 DIAGNOSIS — R0609 Other forms of dyspnea: Secondary | ICD-10-CM | POA: Insufficient documentation

## 2015-05-10 DIAGNOSIS — R5383 Other fatigue: Secondary | ICD-10-CM | POA: Diagnosis not present

## 2015-05-10 LAB — NM MYOCAR MULTI W/SPECT W/WALL MOTION / EF
CHL CUP NUCLEAR SDS: 3
CHL CUP RESTING HR STRESS: 63 {beats}/min
CHL RATE OF PERCEIVED EXERTION: 13
CSEPED: 7 min
CSEPEW: 7.6 METS
CSEPPHR: 134 {beats}/min
Exercise duration (sec): 46 s
LV dias vol: 124 mL
LVSYSVOL: 53 mL
MPHR: 153 {beats}/min
Percent HR: 87 %
RATE: 0.3
SRS: 2
SSS: 4
TID: 0.88

## 2015-05-10 MED ORDER — REGADENOSON 0.4 MG/5ML IV SOLN
INTRAVENOUS | Status: AC
  Filled 2015-05-10: qty 5

## 2015-05-10 MED ORDER — TECHNETIUM TC 99M SESTAMIBI - CARDIOLITE
30.0000 | Freq: Once | INTRAVENOUS | Status: AC | PRN
  Administered 2015-05-10: 12:00:00 30 via INTRAVENOUS

## 2015-05-10 MED ORDER — SODIUM CHLORIDE 0.9% FLUSH
INTRAVENOUS | Status: AC
  Administered 2015-05-10: 10 mL via INTRAVENOUS
  Filled 2015-05-10: qty 10

## 2015-05-10 MED ORDER — TECHNETIUM TC 99M SESTAMIBI GENERIC - CARDIOLITE
10.0000 | Freq: Once | INTRAVENOUS | Status: AC | PRN
  Administered 2015-05-10: 9.95 via INTRAVENOUS

## 2015-05-11 ENCOUNTER — Telehealth: Payer: Self-pay | Admitting: *Deleted

## 2015-05-11 NOTE — Telephone Encounter (Signed)
-----   Message from Herminio Commons, MD sent at 05/10/2015  4:05 PM EST ----- Low risk, no suggestion of blockages.

## 2015-05-11 NOTE — Telephone Encounter (Signed)
Notes Recorded by Laurine Blazer, LPN on [id number] at [id number] PM Left message to return call.

## 2015-05-14 ENCOUNTER — Encounter: Payer: Self-pay | Admitting: *Deleted

## 2015-05-14 ENCOUNTER — Telehealth: Payer: Self-pay

## 2015-05-14 NOTE — Telephone Encounter (Signed)
I got triage info from pt's wife. I have faxed [place] records for previous reports.

## 2015-05-14 NOTE — Telephone Encounter (Signed)
I don't see where the pt had called to schedule appt. I will call him when i call triages this afternoon.

## 2015-05-14 NOTE — Telephone Encounter (Signed)
Pt's wife [name] called to see if DS had tried calling her to get her husband scheduled for a colonoscopy. I told her that DS was not available at the moment, but I would send a phone note for DS to call her back. [phone]

## 2015-05-14 NOTE — Telephone Encounter (Signed)
Notes Recorded by Laurine Blazer, LPN on [id number] at [id number] AM Patient notified of results by letter.  Copy fwd to pmd.

## 2015-05-18 NOTE — Telephone Encounter (Signed)
Per Medical Records no previous colonoscopy reports found. Pt's wife, [name], is aware and Ok to send triage in for appt.

## 2015-05-21 NOTE — Addendum Note (Signed)
Addended by: Everardo All on: 05/21/2015 10:23 AM   Modules accepted: Medications

## 2015-05-21 NOTE — Telephone Encounter (Signed)
Gastroenterology Pre-Procedure Review  Request Date: 05/21/2015 Requesting Physician:   PATIENT REVIEW QUESTIONS: The patient responded to the following health history questions as indicated:    1. Diabetes Melitis: no 2. Joint replacements in the past 12 months: no 3. Major health problems in the past 3 months: no 4. Has an artificial valve or MVP: no 5. Has a defibrillator: no 6. Has been advised in past to take antibiotics in advance of a procedure like teeth cleaning: no 7. Family history of colon cancer: no  8. Alcohol Use: YES    Glass of wine every other day 9. History of sleep apnea: no     MEDICATIONS & ALLERGIES:    Patient reports the following regarding taking any blood thinners:   Plavix? no Aspirin? no Coumadin? no  Patient confirms/reports the following medications:  Current Outpatient Prescriptions  Medication Sig Dispense Refill  . Aspirin-Caffeine (BC FAST PAIN RELIEF PO) Take 1 packet by mouth daily as needed (pain). Reported on 04/26/2015    . dutasteride (AVODART) 0.5 MG capsule Take 0.5 mg by mouth daily.      . fluticasone (FLONASE) 50 MCG/ACT nasal spray Place into both nostrils daily.    Marland Kitchen loratadine-pseudoephedrine (CLARITIN-D 24-HOUR) 10-240 MG per 24 hr tablet Take 1 tablet by mouth. Only as needed    . Multiple Vitamins-Minerals (MULTIVITAMIN GUMMIES ADULT PO) Take 1 capsule by mouth daily.     . tadalafil (CIALIS) 5 MG tablet Take 5 mg by mouth daily.     . Tamsulosin HCl (FLOMAX) 0.4 MG CAPS Take 0.4 mg by mouth daily.      . sodium chloride (OCEAN) 0.65 % nasal spray Place 1 spray into the nose as needed. Reported on 05/21/2015     No current facility-administered medications for this visit.    Patient confirms/reports the following allergies:  No Known Allergies  No orders of the defined types were placed in this encounter.    AUTHORIZATION INFORMATION Primary Insurance:   ID #:   Group #:  Pre-Cert / Auth required:  Pre-Cert / Auth #:    Secondary Insurance:   ID #:  Group #:  Pre-Cert / Auth required Pre-Cert / Auth #:   SCHEDULE INFORMATION: Procedure has been scheduled as follows:  Date:             Time:   Location:   This Gastroenterology Pre-Precedure Review Form is being routed to the following provider(s): R. Garfield Cornea, MD

## 2015-05-24 NOTE — Telephone Encounter (Signed)
Appropriate.

## 2015-06-01 NOTE — Telephone Encounter (Signed)
[name] returned call and pt is scheduled for 06/29/2015 at 9:30 Am ( pt request time) with Dr. Gala Romney.

## 2015-06-01 NOTE — Telephone Encounter (Signed)
Left message for [name] to call for the appt date and time.

## 2015-06-05 ENCOUNTER — Other Ambulatory Visit: Payer: Self-pay

## 2015-06-05 DIAGNOSIS — Z1211 Encounter for screening for malignant neoplasm of colon: Secondary | ICD-10-CM

## 2015-06-05 MED ORDER — PEG 3350-KCL-NA BICARB-NACL 420 G PO SOLR
4000.0000 mL | ORAL | Status: DC
Start: 2015-06-05 — End: 2019-06-23

## 2015-06-05 NOTE — Telephone Encounter (Signed)
Rx sent to the pharmacy and instructions mailed to pt.  

## 2015-06-05 NOTE — Addendum Note (Signed)
Addended by: Everardo All on: 06/05/2015 11:56 AM   Modules accepted: Orders

## 2015-06-12 ENCOUNTER — Ambulatory Visit (INDEPENDENT_AMBULATORY_CARE_PROVIDER_SITE_OTHER): Payer: Federal, State, Local not specified - PPO | Admitting: Cardiovascular Disease

## 2015-06-12 ENCOUNTER — Encounter: Payer: Self-pay | Admitting: Cardiovascular Disease

## 2015-06-12 VITALS — BP 147/79 | HR 70 | Ht 74.0 in | Wt 223.0 lb

## 2015-06-12 DIAGNOSIS — I2510 Atherosclerotic heart disease of native coronary artery without angina pectoris: Secondary | ICD-10-CM

## 2015-06-12 DIAGNOSIS — R5383 Other fatigue: Secondary | ICD-10-CM | POA: Diagnosis not present

## 2015-06-12 DIAGNOSIS — R0609 Other forms of dyspnea: Secondary | ICD-10-CM

## 2015-06-12 NOTE — Patient Instructions (Signed)
Continue all current medications.  Follow up AS NEEDED  

## 2015-06-12 NOTE — Progress Notes (Signed)
Patient ID: William Joseph, male   DOB: Nov 10, 1945, 70 y.o.   MRN: [id number]      SUBJECTIVE: The patient returns for follow-up after undergoing cardiovascular testing performed for the evaluation of exertional dyspnea and fatigue. Nuclear stress test on 05/10/15 was low risk with no clear evidence of ischemia or myocardial scar, LVEF 57%.  They have been having some work done on her house and since the walls have been sealed up, he has noticed symptom improvement. He was told he may have had mold in the walls.   Review of Systems: As per "subjective", otherwise negative.  No Known Allergies  Current Outpatient Prescriptions  Medication Sig Dispense Refill  . Aspirin-Caffeine (BC FAST PAIN RELIEF PO) Take 1 packet by mouth daily as needed (pain). Reported on 04/26/2015    . dutasteride (AVODART) 0.5 MG capsule Take 0.5 mg by mouth daily.      . fluticasone (FLONASE) 50 MCG/ACT nasal spray Place into both nostrils daily.    Marland Kitchen loratadine-pseudoephedrine (CLARITIN-D 24-HOUR) 10-240 MG per 24 hr tablet Take 1 tablet by mouth. Only as needed    . Multiple Vitamins-Minerals (MULTIVITAMIN GUMMIES ADULT PO) Take 1 capsule by mouth daily.     . polyethylene glycol-electrolytes (TRILYTE) 420 g solution Take 4,000 mLs by mouth as directed. 4000 mL 0  . sodium chloride (OCEAN) 0.65 % nasal spray Place 1 spray into the nose as needed. Reported on 05/21/2015    . tadalafil (CIALIS) 5 MG tablet Take 5 mg by mouth daily.     . Tamsulosin HCl (FLOMAX) 0.4 MG CAPS Take 0.4 mg by mouth daily.       No current facility-administered medications for this visit.    Past Medical History  Diagnosis Date  . Non Hodgkin's lymphoma ([place])   . Sleep apnea   . Anxiety   . Essential hypertension, benign   . History of cardiac catheterization     Normal coronaries March 2011  . Nonerosive nonspecific gastritis     EGD - Dr. Laural Golden  . Obsessive-compulsive disorder   . Palpitations     Past Surgical  History  Procedure Laterality Date  . Tooth implant    . Port-a-cath placement    . Exploratory laparotomy    . Lymph node biopsy    . Esophagogastroduodenoscopy endoscopy N/A   . Glaucoma surgery Bilateral 11/2013    didnt work    Social History   Social History  . Marital Status: Married    Spouse Name: N/A  . Number of Children: N/A  . Years of Education: N/A   Occupational History  . Not on file.   Social History Main Topics  . Smoking status: Light Tobacco Smoker -- 1.00 packs/day for 1 years    Types: Cigars    Start date: 09/09/1964  . Smokeless tobacco: Never Used     Comment: Puffs on cigars every so often.   . Alcohol Use: No  . Drug Use: No  . Sexual Activity: Not on file   Other Topics Concern  . Not on file   Social History Narrative     Filed Vitals:   06/12/15 1053  BP: 147/79  Pulse: 70  Height: 6\' 2"  (1.88 m)  Weight: 223 lb (101.152 kg)    PHYSICAL EXAM General: NAD HEENT: Normal. Neck: No JVD, no thyromegaly. Lungs: Clear to auscultation bilaterally with normal respiratory effort. CV: Nondisplaced PMI.  Regular rate and rhythm, normal S1/S2, no S3/S4, no murmur. No pretibial or  periankle edema.   Abdomen: Soft, nontender, no distention.  Neurologic: Alert and oriented.  Psych: Normal affect. Skin: Normal. Musculoskeletal: No gross deformities.  ECG: Most recent ECG reviewed.      ASSESSMENT AND PLAN: 1. Exertional dyspnea, fatigue, and coronary artery calcifications: Exercise Cardiolite stress test low risk with normal LV EF as noted above. Symptoms may be indicative of dust exposure due to home remodeling (asbestos, mold?).  2. Elevated BP: Will need monitoring as he may require antihypertensive therapy.  Dispo: f/u prn.  Kate Sable, M.D., F.A.C.C.

## 2015-06-19 ENCOUNTER — Telehealth: Payer: Self-pay

## 2015-06-19 NOTE — Telephone Encounter (Signed)
I called pt's wife [name] who had give me triage info for the pt. She said there has been no change in his medications since he was triaged for the colonoscopy.

## 2015-06-19 NOTE — Telephone Encounter (Signed)
Noted  

## 2015-06-20 ENCOUNTER — Telehealth: Payer: Self-pay

## 2015-06-20 NOTE — Telephone Encounter (Signed)
I received a phone call from [place] who said that a PA is not required for the screening colonoscopy as out patient.

## 2015-06-20 NOTE — Telephone Encounter (Signed)
I called [employer] @ [phone] and got a recording. Said to leave a message for a return call. I left my name and phone number, the pt's info and asked if PA is required for CPT code [phone] as out patient at the hospital.

## 2015-06-29 ENCOUNTER — Encounter (HOSPITAL_COMMUNITY)
Admission: RE | Disposition: A | Discharge status: Home / Self Care | Payer: Self-pay | Source: Ambulatory Visit | Attending: Internal Medicine

## 2015-06-29 ENCOUNTER — Encounter (HOSPITAL_COMMUNITY): Payer: Self-pay | Admitting: *Deleted

## 2015-06-29 ENCOUNTER — Ambulatory Visit (HOSPITAL_COMMUNITY)
Admission: RE | Admit: 2015-06-29 | Discharge: 2015-06-29 | Disposition: A | Discharge status: Home / Self Care | Payer: Federal, State, Local not specified - PPO | Source: Ambulatory Visit | Attending: Internal Medicine | Admitting: Internal Medicine

## 2015-06-29 DIAGNOSIS — D124 Benign neoplasm of descending colon: Secondary | ICD-10-CM

## 2015-06-29 DIAGNOSIS — Z8572 Personal history of non-Hodgkin lymphomas: Secondary | ICD-10-CM | POA: Insufficient documentation

## 2015-06-29 DIAGNOSIS — Z7982 Long term (current) use of aspirin: Secondary | ICD-10-CM | POA: Diagnosis not present

## 2015-06-29 DIAGNOSIS — Z1211 Encounter for screening for malignant neoplasm of colon: Secondary | ICD-10-CM

## 2015-06-29 DIAGNOSIS — Z86010 Personal history of colonic polyps: Secondary | ICD-10-CM | POA: Insufficient documentation

## 2015-06-29 DIAGNOSIS — Z7951 Long term (current) use of inhaled steroids: Secondary | ICD-10-CM | POA: Diagnosis not present

## 2015-06-29 DIAGNOSIS — F419 Anxiety disorder, unspecified: Secondary | ICD-10-CM | POA: Insufficient documentation

## 2015-06-29 DIAGNOSIS — K5730 Diverticulosis of large intestine without perforation or abscess without bleeding: Secondary | ICD-10-CM | POA: Diagnosis not present

## 2015-06-29 DIAGNOSIS — I10 Essential (primary) hypertension: Secondary | ICD-10-CM | POA: Insufficient documentation

## 2015-06-29 DIAGNOSIS — G4730 Sleep apnea, unspecified: Secondary | ICD-10-CM | POA: Diagnosis not present

## 2015-06-29 DIAGNOSIS — Z79899 Other long term (current) drug therapy: Secondary | ICD-10-CM | POA: Diagnosis not present

## 2015-06-29 DIAGNOSIS — F17290 Nicotine dependence, other tobacco product, uncomplicated: Secondary | ICD-10-CM | POA: Insufficient documentation

## 2015-06-29 DIAGNOSIS — F429 Obsessive-compulsive disorder, unspecified: Secondary | ICD-10-CM | POA: Insufficient documentation

## 2015-06-29 HISTORY — PX: POLYPECTOMY: SHX5525

## 2015-06-29 HISTORY — PX: COLONOSCOPY: SHX5424

## 2015-06-29 LAB — HM COLONOSCOPY

## 2015-06-29 SURGERY — COLONOSCOPY
Anesthesia: Moderate Sedation

## 2015-06-29 MED ORDER — STERILE WATER FOR IRRIGATION IR SOLN
Status: DC | PRN
  Administered 2015-06-29: 09:00:00

## 2015-06-29 MED ORDER — SODIUM CHLORIDE 0.9 % IV SOLN
INTRAVENOUS | Status: DC
  Administered 2015-06-29: 1000 mL via INTRAVENOUS

## 2015-06-29 MED ORDER — MEPERIDINE HCL 100 MG/ML IJ SOLN
INTRAMUSCULAR | Status: AC
  Filled 2015-06-29: qty 2

## 2015-06-29 MED ORDER — ONDANSETRON HCL 4 MG/2ML IJ SOLN
INTRAMUSCULAR | Status: AC
  Filled 2015-06-29: qty 2

## 2015-06-29 MED ORDER — ONDANSETRON HCL 4 MG/2ML IJ SOLN
INTRAMUSCULAR | Status: DC | PRN
  Administered 2015-06-29: 4 mg via INTRAVENOUS

## 2015-06-29 MED ORDER — MEPERIDINE HCL 100 MG/ML IJ SOLN
INTRAMUSCULAR | Status: DC | PRN
  Administered 2015-06-29 (×2): 50 mg

## 2015-06-29 MED ORDER — MIDAZOLAM HCL 5 MG/5ML IJ SOLN
INTRAMUSCULAR | Status: DC | PRN
  Administered 2015-06-29 (×2): 2 mg via INTRAVENOUS

## 2015-06-29 MED ORDER — MIDAZOLAM HCL 5 MG/5ML IJ SOLN
INTRAMUSCULAR | Status: AC
  Filled 2015-06-29: qty 10

## 2015-06-29 NOTE — Op Note (Signed)
[facility] Patient Name: William Joseph Procedure Date: 06/29/2015 8:59 AM MRN: [id number] Date of Birth: November 24, 1945 Attending MD: Norvel Richards , MD CSN: [id number] Age: 70 Admit Type: Outpatient Procedure:                Colonoscopy Indications:              Screening for colorectal malignant neoplasm -                            average risk Providers:                Norvel Richards, MD, Renda Rolls, RN, Isabella Stalling, Technician Referring MD:             Jasper Loser. Luan Pulling, MD (Referring MD), Ancil Linsey (Referring MD) Medicines:                Meperidine 100 mg IV, Midazolam 4 mg IV,                            Ondansetron 4 mg IV Complications:            No immediate complications. Estimated Blood Loss:     Estimated blood loss was minimal. Procedure:                Pre-Anesthesia Assessment:                           - Prior to the procedure, a History and Physical                            was performed, and patient medications and                            allergies were reviewed. The patient's tolerance of                            previous anesthesia was also reviewed. The risks                            and benefits of the procedure and the sedation                            options and risks were discussed with the patient.                            All questions were answered, and informed consent                            was obtained. Prior Anticoagulants: The patient has  taken no previous anticoagulant or antiplatelet                            agents. ASA Grade Assessment: II - A patient with                            mild systemic disease. After reviewing the risks                            and benefits, the patient was deemed in                            satisfactory condition to undergo the procedure.                           After obtaining  informed consent, the colonoscope                            was passed under direct vision. Throughout the                            procedure, the patient's blood pressure, pulse, and                            oxygen saturations were monitored continuously. The                            [id number]) scope was introduced through                            the anus and advanced to the the cecum, identified                            by appendiceal orifice and ileocecal valve. The                            colonoscopy was performed without difficulty. The                            patient tolerated the procedure well. The ileocecal                            valve, appendiceal orifice, and rectum were                            photographed. The quality of the bowel preparation                            was adequate. Scope In: 9:24:55 AM Scope Out: 9:41:28 AM Scope Withdrawal Time: 0 hours 11 minutes 50 seconds  Total Procedure Duration: 0 hours 16 minutes 33 seconds  Findings:      Scattered medium-mouthed diverticula were found in the entire colon.       Estimated blood loss was minimal. The polyp was  removed with a cold       snare. Resection and retrieval were complete.      A 5 mm polyp was found in the descending colon. The polyp was       semi-pedunculated. Estimated blood loss was minimal.      The exam was otherwise without abnormality. Impression:               - Diverticulosis in the entire examined colon.                           - One 5 mm polyp in the descending colon, removed                            with a cold snare. Resected and retrieved.                           - The examination was otherwise normal. Moderate Sedation:      Moderate (conscious) sedation was administered by the endoscopy nurse       and supervised by the endoscopist. The following parameters were       monitored: oxygen saturation, heart rate, blood pressure, respiratory       rate,  EKG, adequacy of pulmonary ventilation, and response to care.       Total physician intraservice time was 26 minutes. Recommendation:           - Patient has a contact number available for                            emergencies. The signs and symptoms of potential                            delayed complications were discussed with the                            patient. Return to normal activities tomorrow.                            Written discharge instructions were provided to the                            patient.                           - Advance diet as tolerated today.                           - Continue present medications.                           - Await pathology results.                           - Repeat colonoscopy is recommended. The                            colonoscopy date will be determined after pathology  results from today's exam become available for                            review. Procedure Code(s):        --- Professional ---                           [phone], Colonoscopy, flexible; with removal of                            tumor(s), polyp(s), or other lesion(s) by snare                            technique                           99152, Moderate sedation services provided by the                            same physician or other qualified health care                            professional performing the diagnostic or                            therapeutic service that the sedation supports,                            requiring the presence of an independent trained                            observer to assist in the monitoring of the                            patient's level of consciousness and physiological                            status; initial 15 minutes of intraservice time,                            patient age 37 years or older                           [phone], Moderate sedation services; each additional                             15 minutes intraservice time Diagnosis Code(s):        --- Professional ---                           Z12.11, Encounter for screening for malignant                            neoplasm of colon  D12.4, Benign neoplasm of descending colon                           K57.30, Diverticulosis of large intestine without                            perforation or abscess without bleeding CPT copyright 2016 American Medical Association. All rights reserved. The codes documented in this report are preliminary and upon coder review may  be revised to meet current compliance requirements. Cristopher Estimable. Iven Earnhart, MD Norvel Richards, MD 06/29/2015 9:59:40 AM This report has been signed electronically. Number of Addenda: 0

## 2015-06-29 NOTE — Discharge Instructions (Signed)
Colonoscopy Discharge Instructions  Read the instructions outlined below and refer to this sheet in the next few weeks. These discharge instructions provide you with general information on caring for yourself after you leave the hospital. Your doctor may also give you specific instructions. While your treatment has been planned according to the most current medical practices available, unavoidable complications occasionally occur. If you have any problems or questions after discharge, call Dr. Gala Romney at [phone]. ACTIVITY  You may resume your regular activity, but move at a slower pace for the next 24 hours.   Take frequent rest periods for the next 24 hours.   Walking will help get rid of the air and reduce the bloated feeling in your belly (abdomen).   No driving for 24 hours (because of the medicine (anesthesia) used during the test).    Do not sign any important legal documents or operate any machinery for 24 hours (because of the anesthesia used during the test).  NUTRITION  Drink plenty of fluids.   You may resume your normal diet as instructed by your doctor.   Begin with a light meal and progress to your normal diet. Heavy or fried foods are harder to digest and may make you feel sick to your stomach (nauseated).   Avoid alcoholic beverages for 24 hours or as instructed.  MEDICATIONS  You may resume your normal medications unless your doctor tells you otherwise.  WHAT YOU CAN EXPECT TODAY  Some feelings of bloating in the abdomen.   Passage of more gas than usual.   Spotting of blood in your stool or on the toilet paper.  IF YOU HAD POLYPS REMOVED DURING THE COLONOSCOPY:  No aspirin products for 7 days or as instructed.   No alcohol for 7 days or as instructed.   Eat a soft diet for the next 24 hours.  FINDING OUT THE RESULTS OF YOUR TEST Not all test results are available during your visit. If your test results are not back during the visit, make an appointment  with your caregiver to find out the results. Do not assume everything is normal if you have not heard from your caregiver or the medical facility. It is important for you to follow up on all of your test results.  SEEK IMMEDIATE MEDICAL ATTENTION IF:  You have more than a spotting of blood in your stool.   Your belly is swollen (abdominal distention).   You are nauseated or vomiting.   You have a temperature over 101.   You have abdominal pain or discomfort that is severe or gets worse throughout the day.   Colon diverticulosis and polyp information provided  Further recommendations to follow pending review of pathology report  Colon Polyps Polyps are lumps of extra tissue growing inside the body. Polyps can grow in the large intestine (colon). Most colon polyps are noncancerous (benign). However, some colon polyps can become cancerous over time. Polyps that are larger than a pea may be harmful. To be safe, caregivers remove and test all polyps. CAUSES  Polyps form when mutations in the genes cause your cells to grow and divide even though no more tissue is needed. RISK FACTORS There are a number of risk factors that can increase your chances of getting colon polyps. They include:  Being older than 50 years.  Family history of colon polyps or colon cancer.  Long-term colon diseases, such as colitis or Crohn disease.  Being overweight.  Smoking.  Being inactive.  Drinking too much  alcohol. SYMPTOMS  Most small polyps do not cause symptoms. If symptoms are present, they may include:  Blood in the stool. The stool may look dark red or black.  Constipation or diarrhea that lasts longer than 1 week. DIAGNOSIS People often do not know they have polyps until their caregiver finds them during a regular checkup. Your caregiver can use 4 tests to check for polyps:  Digital rectal exam. The caregiver wears gloves and feels inside the rectum. This test would find polyps only in the  rectum.  Barium enema. The caregiver puts a liquid called barium into your rectum before taking X-rays of your colon. Barium makes your colon look white. Polyps are dark, so they are easy to see in the X-ray pictures.  Sigmoidoscopy. A thin, flexible tube (sigmoidoscope) is placed into your rectum. The sigmoidoscope has a light and tiny camera in it. The caregiver uses the sigmoidoscope to look at the last third of your colon.  Colonoscopy. This test is like sigmoidoscopy, but the caregiver looks at the entire colon. This is the most common method for finding and removing polyps. TREATMENT  Any polyps will be removed during a sigmoidoscopy or colonoscopy. The polyps are then tested for cancer. PREVENTION  To help lower your risk of getting more colon polyps:  Eat plenty of fruits and vegetables. Avoid eating fatty foods.  Do not smoke.  Avoid drinking alcohol.  Exercise every day.  Lose weight if recommended by your caregiver.  Eat plenty of calcium and folate. Foods that are rich in calcium include milk, cheese, and broccoli. Foods that are rich in folate include chickpeas, kidney beans, and spinach. HOME CARE INSTRUCTIONS Keep all follow-up appointments as directed by your caregiver. You may need periodic exams to check for polyps. SEEK MEDICAL CARE IF: You notice bleeding during a bowel movement.   This information is not intended to replace advice given to you by your health care provider. Make sure you discuss any questions you have with your health care provider.   Document Released: 12/19/2003 Document Revised: 04/14/2014 Document Reviewed: 06/03/2011 Elsevier Interactive Patient Education 2016 [employer].   Diverticulosis Diverticulosis is the condition that develops when small pouches (diverticula) form in the wall of your colon. Your colon, or large intestine, is where water is absorbed and stool is formed. The pouches form when the inside layer of your colon pushes  through weak spots in the outer layers of your colon. CAUSES  No one knows exactly what causes diverticulosis. RISK FACTORS  Being older than 15. Your risk for this condition increases with age. Diverticulosis is rare in people younger than 40 years. By age 70, almost everyone has it.  Eating a low-fiber diet.  Being frequently constipated.  Being overweight.  Not getting enough exercise.  Smoking.  Taking over-the-counter pain medicines, like aspirin and ibuprofen. SYMPTOMS  Most people with diverticulosis do not have symptoms. DIAGNOSIS  Because diverticulosis often has no symptoms, health care providers often discover the condition during an exam for other colon problems. In many cases, a health care provider will diagnose diverticulosis while using a flexible scope to examine the colon (colonoscopy). TREATMENT  If you have never developed an infection related to diverticulosis, you may not need treatment. If you have had an infection before, treatment may include:  Eating more fruits, vegetables, and grains.  Taking a fiber supplement.  Taking a live bacteria supplement (probiotic).  Taking medicine to relax your colon. HOME CARE INSTRUCTIONS   Drink at  least 6-8 glasses of water each day to prevent constipation.  Try not to strain when you have a bowel movement.  Keep all follow-up appointments. If you have had an infection before:  Increase the fiber in your diet as directed by your health care provider or dietitian.  Take a dietary fiber supplement if your health care provider approves.  Only take medicines as directed by your health care provider. SEEK MEDICAL CARE IF:   You have abdominal pain.  You have bloating.  You have cramps.  You have not gone to the bathroom in 3 days. SEEK IMMEDIATE MEDICAL CARE IF:   Your pain gets worse.  Yourbloating becomes very bad.  You have a fever or chills, and your symptoms suddenly get worse.  You begin  vomiting.  You have bowel movements that are bloody or black. MAKE SURE YOU:  Understand these instructions.  Will watch your condition.  Will get help right away if you are not doing well or get worse.   This information is not intended to replace advice given to you by your health care provider. Make sure you discuss any questions you have with your health care provider.   Document Released: 12/20/2003 Document Revised: 03/29/2013 Document Reviewed: 02/16/2013 Elsevier Interactive Patient Education [employer].

## 2015-06-29 NOTE — H&P (Signed)
@LOGO @   Primary Care Physician:  [name], MD Primary Gastroenterologist:  Dr. Gala Romney  Pre-Procedure History & Physical: HPI:  William Joseph is a 70 y.o. male is here for a screening colonoscopy. No bowel symptoms. No family history of colon cancer. Reported negative colonoscopy 10 years ago-records unavailable  Past Medical History  Diagnosis Date  . Non Hodgkin's lymphoma ([place])   . Sleep apnea   . Anxiety   . Essential hypertension, benign   . History of cardiac catheterization     Normal coronaries March 2011  . Nonerosive nonspecific gastritis     EGD - Dr. Laural Golden  . Obsessive-compulsive disorder   . Palpitations     Past Surgical History  Procedure Laterality Date  . Tooth implant    . Port-a-cath placement    . Exploratory laparotomy    . Lymph node biopsy    . Esophagogastroduodenoscopy endoscopy N/A   . Glaucoma surgery Bilateral 11/2013    didnt work    Prior to Admission medications   Medication Sig Start Date End Date Taking? Authorizing Provider  Aspirin-Caffeine (BC FAST PAIN RELIEF PO) Take 1 packet by mouth daily as needed (pain). Reported on 04/26/2015   Yes Historical Provider, MD  dutasteride (AVODART) 0.5 MG capsule Take 0.5 mg by mouth daily.     Yes Historical Provider, MD  fluticasone (FLONASE) 50 MCG/ACT nasal spray Place into both nostrils daily.   Yes Historical Provider, MD  loratadine-pseudoephedrine (CLARITIN-D 24-HOUR) 10-240 MG per 24 hr tablet Take 1 tablet by mouth. Only as needed   Yes Historical Provider, MD  Multiple Vitamins-Minerals (MULTIVITAMIN GUMMIES ADULT PO) Take 1 capsule by mouth daily.    Yes Historical Provider, MD  polyethylene glycol-electrolytes (TRILYTE) 420 g solution Take 4,000 mLs by mouth as directed. 06/05/15  Yes Daneil Dolin, MD  sodium chloride (OCEAN) 0.65 % nasal spray Place 1 spray into the nose as needed. Reported on 05/21/2015   Yes Historical Provider, MD  tadalafil (CIALIS) 5 MG tablet Take 5  mg by mouth daily.    Yes Historical Provider, MD  Tamsulosin HCl (FLOMAX) 0.4 MG CAPS Take 0.4 mg by mouth daily.     Yes Historical Provider, MD    Allergies as of 06/05/2015  . (No Known Allergies)    Family History  Problem Relation Age of Onset  . Diabetes Father   . Coronary artery disease Mother   . Hypertension Brother   . Mental illness Maternal Uncle   . Dementia Maternal Grandfather   . Seizures Cousin   . ADD / ADHD Neg Hx   . Alcohol abuse Neg Hx   . Anxiety disorder Neg Hx   . Bipolar disorder Neg Hx   . Depression Neg Hx   . OCD Neg Hx   . Paranoid behavior Neg Hx   . Schizophrenia Neg Hx   . Sexual abuse Neg Hx   . Physical abuse Neg Hx   . Drug abuse Other   . Seizures Maternal Uncle     Social History   Social History  . Marital Status: Married    Spouse Name: N/A  . Number of Children: N/A  . Years of Education: N/A   Occupational History  . Not on file.   Social History Main Topics  . Smoking status: Light Tobacco Smoker -- 1.00 packs/day for 1 years    Types: Cigars    Start date: 09/09/1964  . Smokeless tobacco: Never Used     Comment:  Puffs on cigars every so often.   . Alcohol Use: 0.0 oz/week    0 Standard drinks or equivalent per week     Comment: glass of wine occasionally  . Drug Use: No  . Sexual Activity: Not on file   Other Topics Concern  . Not on file   Social History Narrative    Review of Systems: See HPI, otherwise negative ROS  Physical Exam: BP 147/95 mmHg  Pulse 66  Temp(Src) 98 F (36.7 C) (Oral)  Resp 14  Ht 6\' 2"  (1.88 m)  Wt 225 lb (102.059 kg)  BMI 28.88 kg/m2  SpO2 100% General:   Alert,  Well-developed, well-nourished, pleasant and cooperative in NAD Head:  Normocephalic and atraumatic. Eyes:  Sclera clear, no icterus.   Conjunctiva pink. Lungs:  Clear throughout to auscultation.   No wheezes, crackles, or rhonchi. No acute distress. Heart:  Regular rate and rhythm; no murmurs, clicks, rubs,  or  gallops. Abdomen:  Soft, nontender and nondistended. No masses, hepatosplenomegaly or hernias noted. Normal bowel sounds, without guarding, and without rebound.    Impression/Plan: William Joseph is now here to undergo a screening colonoscopy.  Average risk screening examination.  Risks, benefits, limitations, imponderables and alternatives regarding colonoscopy have been reviewed with the patient. Questions have been answered. All parties agreeable.     Notice:  This dictation was prepared with Dragon dictation along with smaller phrase technology. Any transcriptional errors that result from this process are unintentional and may not be corrected upon review.

## 2015-07-02 ENCOUNTER — Encounter: Payer: Self-pay | Admitting: Internal Medicine

## 2015-07-04 ENCOUNTER — Encounter (HOSPITAL_COMMUNITY): Payer: Self-pay | Admitting: Internal Medicine

## 2015-08-16 ENCOUNTER — Ambulatory Visit (INDEPENDENT_AMBULATORY_CARE_PROVIDER_SITE_OTHER): Payer: Federal, State, Local not specified - PPO | Admitting: Otolaryngology

## 2015-08-16 DIAGNOSIS — J310 Chronic rhinitis: Secondary | ICD-10-CM

## 2015-08-16 DIAGNOSIS — H6522 Chronic serous otitis media, left ear: Secondary | ICD-10-CM

## 2015-08-16 DIAGNOSIS — J343 Hypertrophy of nasal turbinates: Secondary | ICD-10-CM | POA: Diagnosis not present

## 2015-08-16 DIAGNOSIS — H6122 Impacted cerumen, left ear: Secondary | ICD-10-CM

## 2015-08-16 DIAGNOSIS — H903 Sensorineural hearing loss, bilateral: Secondary | ICD-10-CM | POA: Diagnosis not present

## 2015-09-13 ENCOUNTER — Ambulatory Visit (INDEPENDENT_AMBULATORY_CARE_PROVIDER_SITE_OTHER): Payer: Federal, State, Local not specified - PPO | Admitting: Otolaryngology

## 2015-09-13 DIAGNOSIS — J310 Chronic rhinitis: Secondary | ICD-10-CM

## 2015-09-13 DIAGNOSIS — H6522 Chronic serous otitis media, left ear: Secondary | ICD-10-CM

## 2015-09-13 DIAGNOSIS — H6982 Other specified disorders of Eustachian tube, left ear: Secondary | ICD-10-CM

## 2016-03-24 ENCOUNTER — Ambulatory Visit (INDEPENDENT_AMBULATORY_CARE_PROVIDER_SITE_OTHER): Payer: Self-pay | Admitting: Otolaryngology

## 2016-04-08 ENCOUNTER — Other Ambulatory Visit: Payer: Self-pay | Admitting: Acute Care

## 2016-04-08 DIAGNOSIS — Z87891 Personal history of nicotine dependence: Secondary | ICD-10-CM

## 2016-04-25 ENCOUNTER — Other Ambulatory Visit (HOSPITAL_COMMUNITY): Payer: Self-pay

## 2016-04-25 ENCOUNTER — Ambulatory Visit (HOSPITAL_COMMUNITY): Payer: Self-pay | Admitting: Oncology

## 2016-04-25 ENCOUNTER — Encounter (HOSPITAL_COMMUNITY): Payer: Self-pay | Admitting: Oncology

## 2016-04-25 NOTE — Assessment & Plan Note (Deleted)
History of Stage IIA DLBCL, treated first with EPOCH then RCHOP with CR following second cycle. PET/CT December 2011 showed NED.   Labs today: CBC diff, CMET.  I personally reviewed and went over laboratory results with the patient.  The results are noted within this dictation.  Labs in 12 months: CBC diff, CMET.    Return in 12 months for follow-up.  No indication for routine surveillance imaging.  Imaging can be performed as clinically indicated.

## 2016-04-25 NOTE — Progress Notes (Deleted)
William Bogus, MD [place] [address]  No diagnosis found.  CURRENT THERAPY: Surveillance per NCCN guidelines  INTERVAL HISTORY: William Joseph 71 y.o. male returns for followup of history of Stage IIA DLBCL, treated first with EPOCH then RCHOP with CR following second cycle. PET/CT December 2011 showed NED.   ROS  Past Medical History:  Diagnosis Date  . Anxiety   . Essential hypertension, benign   . History of cardiac catheterization    Normal coronaries March 2011  . Non Hodgkin's lymphoma ([place])   . Nonerosive nonspecific gastritis    EGD - Dr. Laural Golden  . Obsessive-compulsive disorder   . Palpitations   . Sleep apnea     Past Surgical History:  Procedure Laterality Date  . COLONOSCOPY N/A 06/29/2015   Procedure: COLONOSCOPY;  Surgeon: Daneil Dolin, MD;  Location: [facility];  Service: Endoscopy;  Laterality: N/A;  9:30 Am - moved to 9:15 - office to notify  . ESOPHAGOGASTRODUODENOSCOPY ENDOSCOPY N/A   . EXPLORATORY LAPAROTOMY    . GLAUCOMA SURGERY Bilateral 11/2013   didnt work  . LYMPH NODE BIOPSY    . POLYPECTOMY  06/29/2015   Procedure: POLYPECTOMY;  Surgeon: Daneil Dolin, MD;  Location: [facility];  Service: Endoscopy;;  descending colon polyp removed via cold snare  . Port-a-cath placement    . Tooth implant      Family History  Problem Relation Age of Onset  . Diabetes Father   . Coronary artery disease Mother   . Hypertension Brother   . Mental illness Maternal Uncle   . Dementia Maternal Grandfather   . Seizures Cousin   . ADD / ADHD Neg Hx   . Alcohol abuse Neg Hx   . Anxiety disorder Neg Hx   . Bipolar disorder Neg Hx   . Depression Neg Hx   . OCD Neg Hx   . Paranoid behavior Neg Hx   . Schizophrenia Neg Hx   . Sexual abuse Neg Hx   . Physical abuse Neg Hx   . Drug abuse Other   . Seizures Maternal Uncle     Social History   Social History  . Marital status: Married    Spouse  name: N/A  . Number of children: N/A  . Years of education: N/A   Social History Main Topics  . Smoking status: Light Tobacco Smoker    Packs/day: 1.00    Years: 1.00    Types: Cigars    Start date: 09/09/1964  . Smokeless tobacco: Never Used     Comment: Puffs on cigars every so often.   . Alcohol use 0.0 oz/week     Comment: glass of wine occasionally  . Drug use: No  . Sexual activity: Not on file   Other Topics Concern  . Not on file   Social History Narrative  . No narrative on file     PHYSICAL EXAMINATION  ECOG PERFORMANCE STATUS: {CHL ONC ECOG [phone]}  There were no vitals filed for this visit.  GENERAL:{CHL ONC PE GENERAL:[phone]} SKIN: {CHL ONC PE HT:[id number] HEAD: {CHL ONC PE AE:[id number] EYES: {CHL ONC PE EY:[id number] EARS: {CHL ONC PE AC:[id number] OROPHARYNX:{CHL ONC PE OROPHARYNX:[phone]}  NECK: {CHL ONC PE FZ:[id number] LYMPH:  {CHL ONC PE RP:[id number] BREAST:{CHL ONC PE BREAST:[phone]} LUNGS: {CHL ONC PE LK:[id number] HEART: {CHL ONC PE LA:[id number] ABDOMEN:{CHL ONC PE ABDOMEN:[phone]} BACK: {CHL ONC PE YK:9999879 EXTREMITIES:{CHL ONC PE EXTREMITIES:[phone]}  NEURO: {CHL  ONC PE NEURO:[phone]} PELVIC:{CHL ONC PE PELVIC:[phone]} RECTAL: {CHL ONC PE [phone]}   LABORATORY DATA: CBC    Component Value Date/Time   WBC 5.7 04/26/2015 0935   RBC 4.38 04/26/2015 0935   HGB 13.0 04/26/2015 0935   HCT 40.9 04/26/2015 0935   PLT 162 04/26/2015 0935   MCV 93.4 04/26/2015 0935   MCH 29.7 04/26/2015 0935   MCHC 31.8 04/26/2015 0935   RDW 13.8 04/26/2015 0935   LYMPHSABS 2.0 04/26/2014 0924   MONOABS 0.3 04/26/2014 0924   EOSABS 0.2 04/26/2014 0924   BASOSABS 0.0 04/26/2014 0924      Chemistry      Component Value Date/Time   NA 140 04/26/2015 0935   K 4.2 04/26/2015 0935   CL 109 04/26/2015 0935   CO2 24 04/26/2015 0935   BUN 17 04/26/2015 0935   CREATININE 0.98  04/26/2015 0935      Component Value Date/Time   CALCIUM 9.5 04/26/2015 0935   ALKPHOS 52 04/26/2015 0935   AST 19 04/26/2015 0935   ALT 17 04/26/2015 0935   BILITOT 0.6 04/26/2015 0935        PENDING LABS:   RADIOGRAPHIC STUDIES:  No results found.   PATHOLOGY:    ASSESSMENT AND PLAN:  No problem-specific Assessment & Plan notes found for this encounter.   ORDERS PLACED FOR THIS ENCOUNTER: No orders of the defined types were placed in this encounter.   MEDICATIONS PRESCRIBED THIS ENCOUNTER: No orders of the defined types were placed in this encounter.   THERAPY PLAN:  NCCN guidelines recommends the follow surveillance for Stage I, II for those who asertain a complete response in the first-line treatment setting (3.2017):  A. H&P every 3-6 months for 5 years, then yearly or as clinically indicated.  B. Labs every 3-6 months for 5 years and then annually or as clinically indicated.  C. Imaging only as indicated. For those ascertaining a complete response in the Stage III, IV setting in first-line treatment setting:  A. H&P every 3-6 months for 5 years, then yearly or as clinically indicated.  B. Labs every 3-6 months for 5 years and then annually or as clinically indicated.  C. CT C/A/P with contrast no more than every 6 months for 2 years after  completion of treatment; then only as indicated.   All questions were answered. The patient knows to call the clinic with any problems, questions or concerns. We can certainly see the patient much sooner if necessary.  Patient and plan discussed with Dr. Ancil Linsey and she is in agreement with the aforementioned.   This note is electronically signed by: Robynn Pane, PA-C 04/25/2016 8:25 AM

## 2016-05-09 ENCOUNTER — Other Ambulatory Visit: Payer: Self-pay | Admitting: Acute Care

## 2016-05-09 DIAGNOSIS — Z87891 Personal history of nicotine dependence: Secondary | ICD-10-CM

## 2016-05-22 ENCOUNTER — Ambulatory Visit (HOSPITAL_COMMUNITY)
Admission: RE | Admit: 2016-05-22 | Discharge: 2016-05-22 | Disposition: A | Discharge status: Home / Self Care | Payer: Federal, State, Local not specified - PPO | Source: Ambulatory Visit | Attending: Acute Care | Admitting: Acute Care

## 2016-05-22 DIAGNOSIS — I700 Atherosclerosis of aorta: Secondary | ICD-10-CM | POA: Insufficient documentation

## 2016-05-22 DIAGNOSIS — I2510 Atherosclerotic heart disease of native coronary artery without angina pectoris: Secondary | ICD-10-CM | POA: Insufficient documentation

## 2016-05-22 DIAGNOSIS — Z87891 Personal history of nicotine dependence: Secondary | ICD-10-CM | POA: Diagnosis present

## 2016-05-22 DIAGNOSIS — Z122 Encounter for screening for malignant neoplasm of respiratory organs: Secondary | ICD-10-CM | POA: Diagnosis not present

## 2016-05-27 ENCOUNTER — Telehealth: Payer: Self-pay | Admitting: Acute Care

## 2016-05-27 DIAGNOSIS — Z87891 Personal history of nicotine dependence: Secondary | ICD-10-CM

## 2016-05-27 NOTE — Telephone Encounter (Signed)
I have called Mr.  Manos with the results of his low-dose screening CT. his scan was read as a Lung RADS 1, negative study: no nodules or definitely benign nodules. Radiology recommendation is for a repeat LDCT in 12 months. We will order and schedule his scan for February 2018. I explained that there were incidental findings of aortic atherosclerosis, and coronary artery disease. I explained that these were also noted on last years exam. He states he is currently not taking a statin however has his cholesterol and triglycerides monitored on an annual basis by his primary care provider. I explained that I will fax a copy of this results to his PCP to allow him to follow-up as he feels is clinically indicated. The patient verbalized understanding and had no further questions at completion of the call.

## 2016-08-04 ENCOUNTER — Ambulatory Visit (INDEPENDENT_AMBULATORY_CARE_PROVIDER_SITE_OTHER): Payer: Federal, State, Local not specified - PPO | Admitting: Otolaryngology

## 2016-08-04 DIAGNOSIS — H6122 Impacted cerumen, left ear: Secondary | ICD-10-CM | POA: Diagnosis not present

## 2016-12-18 ENCOUNTER — Ambulatory Visit (INDEPENDENT_AMBULATORY_CARE_PROVIDER_SITE_OTHER): Payer: Federal, State, Local not specified - PPO | Admitting: Otolaryngology

## 2016-12-18 DIAGNOSIS — H6122 Impacted cerumen, left ear: Secondary | ICD-10-CM

## 2017-02-09 ENCOUNTER — Ambulatory Visit (INDEPENDENT_AMBULATORY_CARE_PROVIDER_SITE_OTHER): Payer: Federal, State, Local not specified - PPO | Admitting: Otolaryngology

## 2017-02-09 DIAGNOSIS — H6122 Impacted cerumen, left ear: Secondary | ICD-10-CM | POA: Diagnosis not present

## 2017-03-15 IMAGING — CT CT CHEST LUNG CANCER SCREENING LOW DOSE W/O CM
2 of 4 series · 15 of 40 positions shown, 18 images · non-contrast
Comparison: PET-CT FROM 04/03/2010

CLINICAL DATA: [age] year old with 40 pack-year history of
smoking. Lung cancer screening.

EXAM:
CT CHEST WITHOUT CONTRAST
TECHNIQUE: Multidetector CT imaging of the chest was performed following the
standard protocol without IV contrast.

[Series 2: thorax 5.0 i31f 3 · axial · 0.77mm/px · z∈[+937,+1212]mm · 12 of 67 slices shown, 15 images]
[im 6/67  mediastinal]
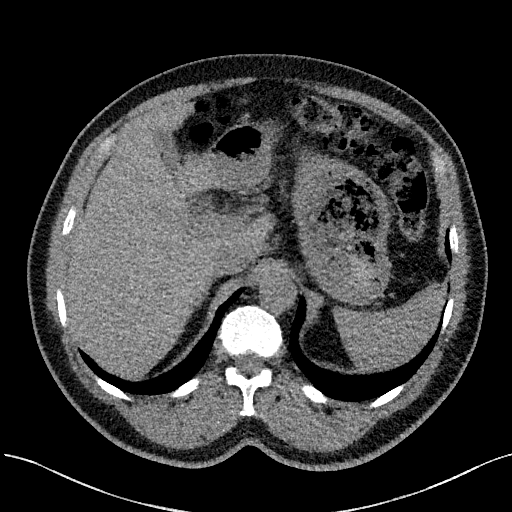
[im 6/67  lung]
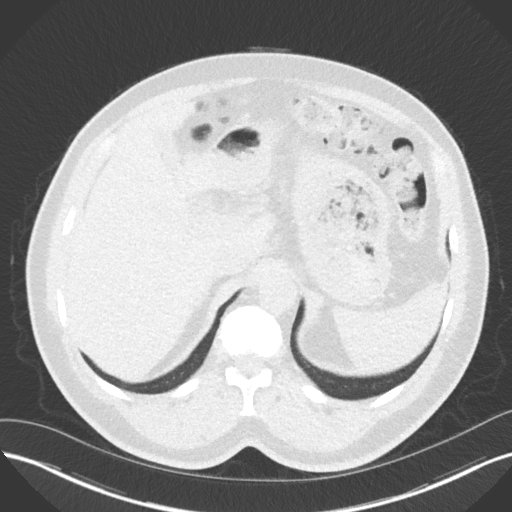
[im 11/67  lung]
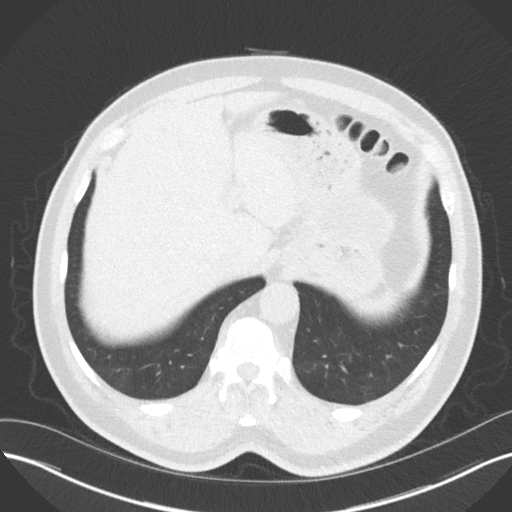
[im 16/67  lung]
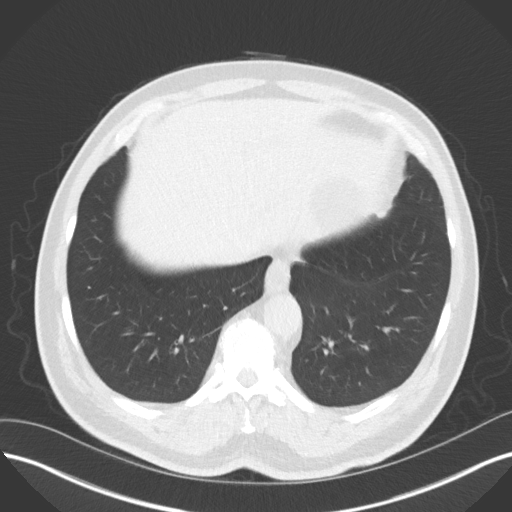
[im 21/67  lung]
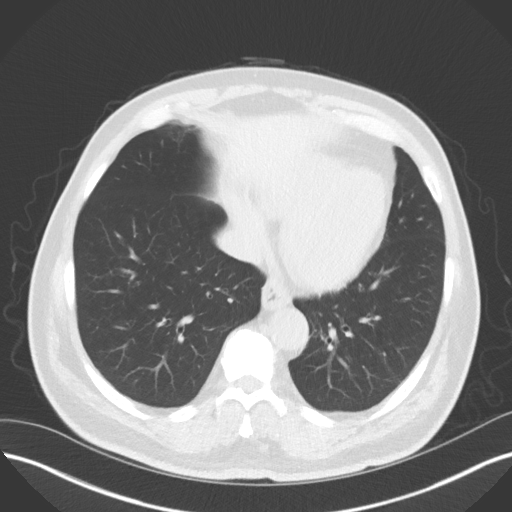
[im 26/67  mediastinal]
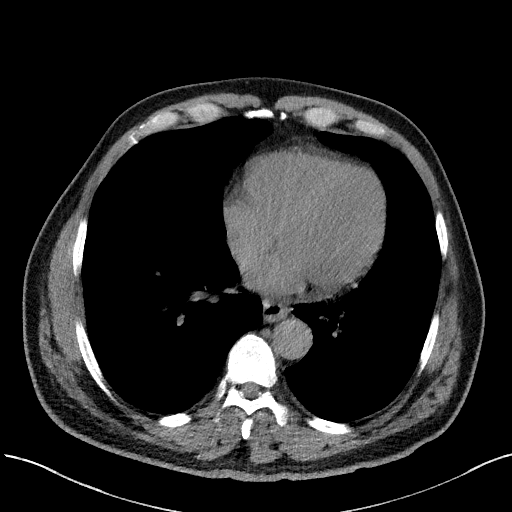
[im 26/67  lung]
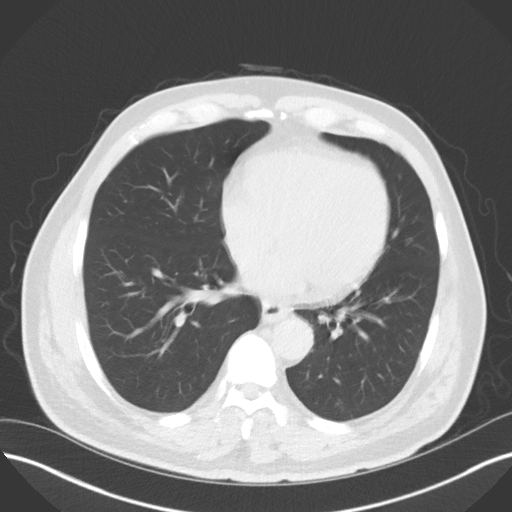
[im 31/67  lung]
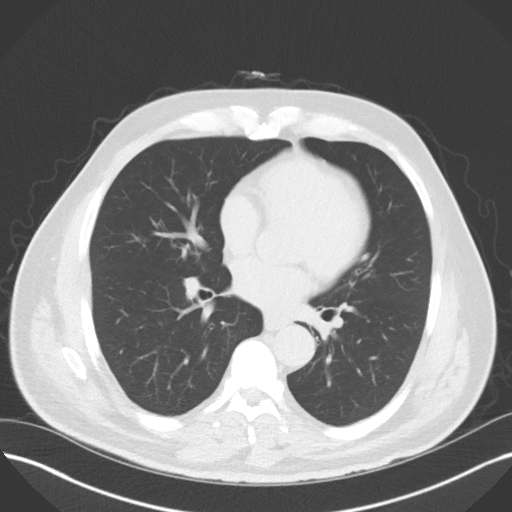
[im 36/67  lung]
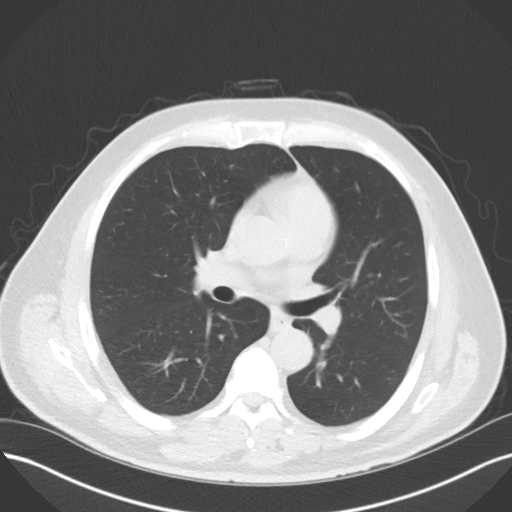
[im 41/67  lung]
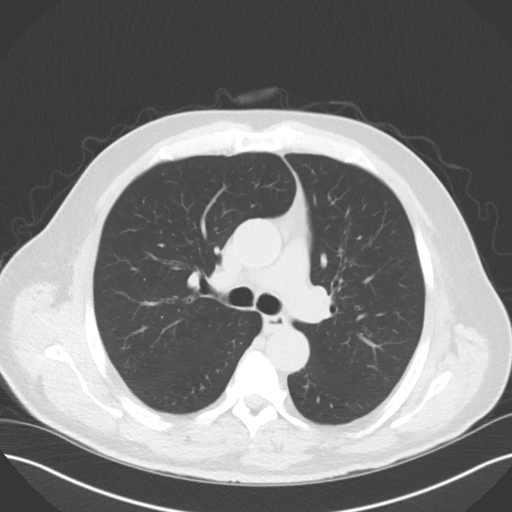
[im 46/67  mediastinal]
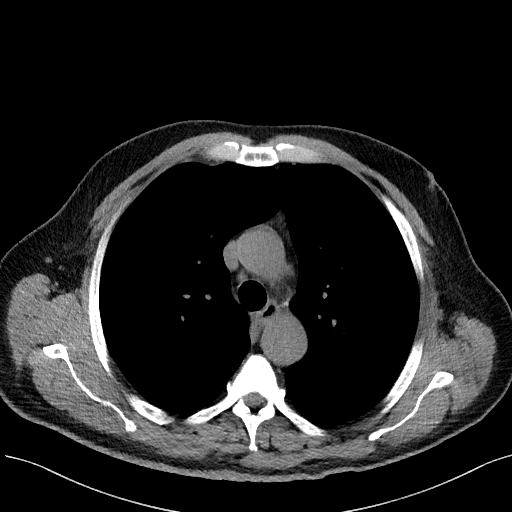
[im 46/67  lung]
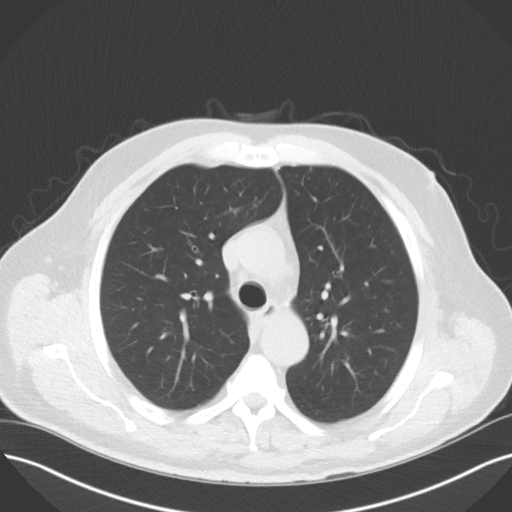
[im 51/67  lung]
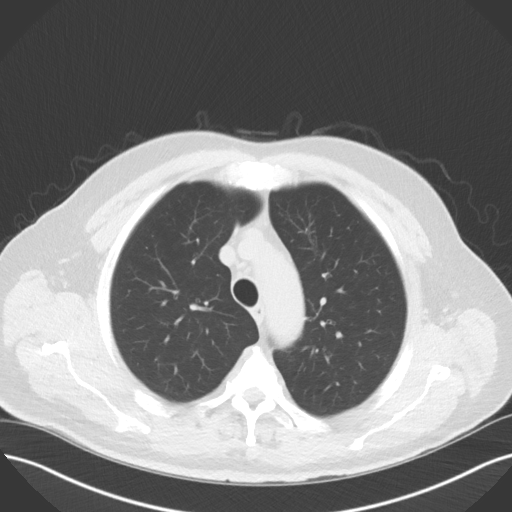
[im 56/67  lung]
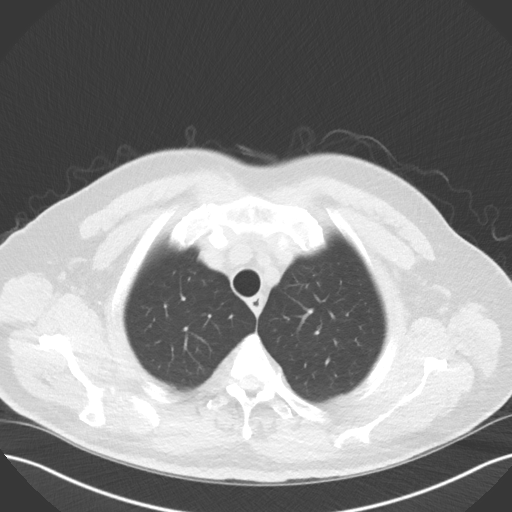
[im 61/67  lung]
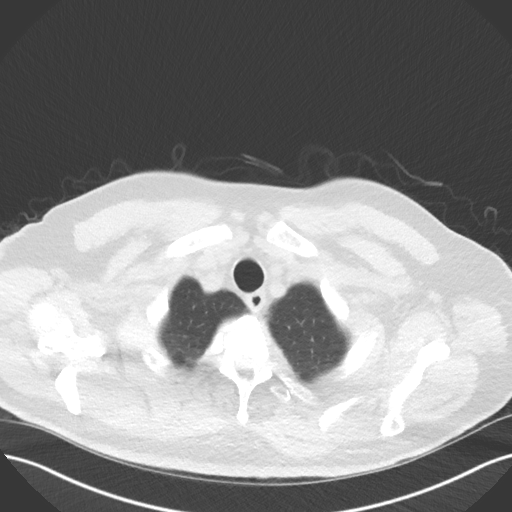

[Series 5: coronal · coronal · 0.59mm/px · 3 of 154 slices shown]
[im 31/154  lung]
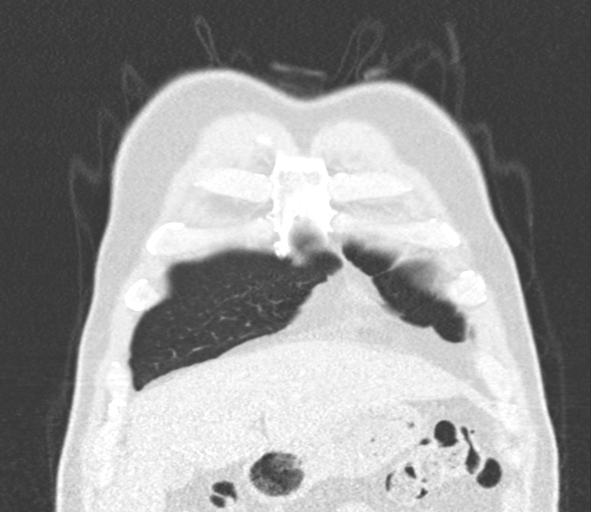
[im 62/154  lung]
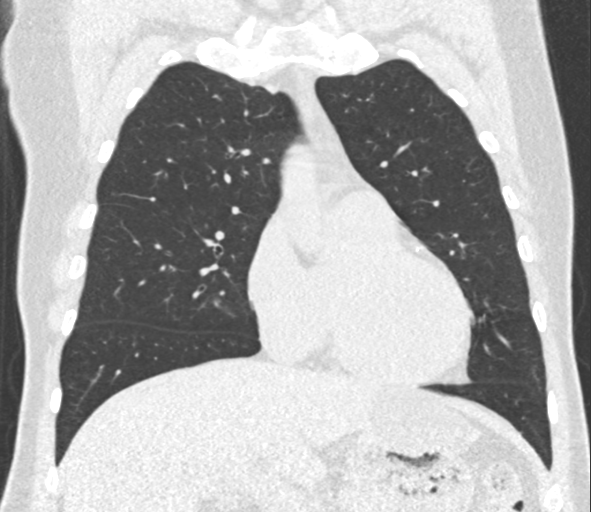
[im 92/154  lung]
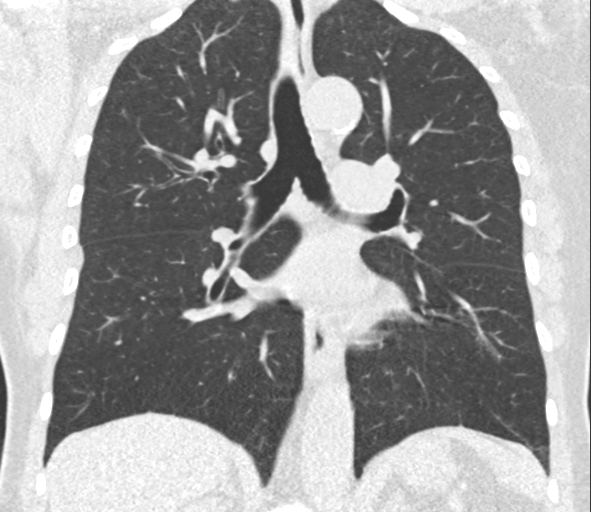

[15 of 40 positions shown; findings below may reference images not displayed]

FINDINGS: Mediastinum / Lymph Nodes: There is no axillary lymphadenopathy. No
mediastinal lymphadenopathy. There is no hilar lymphadenopathy. The
heart size is normal. No pericardial effusion. Coronary artery
calcification is noted. The esophagus has normal imaging features.

Lungs / Pleura: Underlying changes of centrilobular emphysema.
Bronchial wall thickening is associated. Scattered bilateral
pulmonary nodules are evident. Dominant nodule is a subtle left
upper lobe ground-glass opacity with a volume derived equivalent
diameter of 9.6 mm (see image 150 series 3). No focal airspace
consolidation. No pulmonary edema or pleural effusion.

Upper Abdomen: 5.3 cm water density structure in the dome of the
left liver has progressed from 3.8 cm on the previous study and has
been present since at least 02/08/2008. This is likely a cyst.

[HOSPITAL] / Soft Tissues: Bone windows reveal no worrisome lytic or
sclerotic osseous lesions.
IMPRESSION: 1. Lung-RADS Category 2, benign appearance or behavior. Continue
annual screening with low-dose chest CT without contrast in 12
months.
2. Coronary artery atherosclerosis.
3. 5.3 cm cyst in the dome of the left liver.

## 2017-04-23 ENCOUNTER — Ambulatory Visit (INDEPENDENT_AMBULATORY_CARE_PROVIDER_SITE_OTHER): Payer: Federal, State, Local not specified - PPO | Admitting: Otolaryngology

## 2017-04-23 DIAGNOSIS — H6123 Impacted cerumen, bilateral: Secondary | ICD-10-CM | POA: Diagnosis not present

## 2017-05-18 LAB — BASIC METABOLIC PANEL
BUN: 13 (ref 4–21)
CO2: 28 — AB (ref 13–22)
Chloride: 107 (ref 99–108)
Creatinine: 1 (ref <=1.3)
Glucose: 102
Potassium: 4.3 (ref 3.4–5.3)
Sodium: 141 (ref 137–147)

## 2017-05-18 LAB — CBC AND DIFFERENTIAL
HCT: 39 — AB (ref 41–53)
Hemoglobin: 13.1 — AB (ref 13.5–17.5)
Platelets: 165 (ref 150–399)
WBC: 3.9

## 2017-05-18 LAB — PSA: PSA: 5.4

## 2017-05-18 LAB — COMPREHENSIVE METABOLIC PANEL
Albumin: 4.3 (ref 3.5–5.0)
Calcium: 9.3 (ref 8.7–10.7)
GFR calc Af Amer: 93
GFR calc non Af Amer: 80
Globulin: 2.9

## 2017-05-18 LAB — LIPID PANEL
Cholesterol: 201 — AB (ref 0–200)
HDL: 46 (ref 35–70)
LDL Cholesterol: 138
Triglycerides: 76 (ref 40–160)

## 2017-05-18 LAB — TSH: TSH: 0.37 — AB (ref <=5.90)

## 2017-05-18 LAB — HEMOGLOBIN A1C: Hemoglobin A1C: 5.8

## 2017-05-18 LAB — CBC: RBC: 4.38 (ref 3.87–5.11)

## 2017-05-25 ENCOUNTER — Ambulatory Visit (HOSPITAL_COMMUNITY)
Admission: RE | Admit: 2017-05-25 | Discharge: 2017-05-25 | Disposition: A | Discharge status: Home / Self Care | Payer: Federal, State, Local not specified - PPO | Source: Ambulatory Visit | Attending: Acute Care | Admitting: Acute Care

## 2017-05-25 ENCOUNTER — Ambulatory Visit (HOSPITAL_COMMUNITY): Payer: Federal, State, Local not specified - PPO

## 2017-05-25 DIAGNOSIS — Z87891 Personal history of nicotine dependence: Secondary | ICD-10-CM

## 2017-05-25 DIAGNOSIS — Z122 Encounter for screening for malignant neoplasm of respiratory organs: Secondary | ICD-10-CM | POA: Diagnosis not present

## 2017-05-25 DIAGNOSIS — J439 Emphysema, unspecified: Secondary | ICD-10-CM | POA: Diagnosis not present

## 2017-05-25 DIAGNOSIS — I700 Atherosclerosis of aorta: Secondary | ICD-10-CM | POA: Insufficient documentation

## 2017-05-25 DIAGNOSIS — I2510 Atherosclerotic heart disease of native coronary artery without angina pectoris: Secondary | ICD-10-CM | POA: Insufficient documentation

## 2017-05-25 DIAGNOSIS — I719 Aortic aneurysm of unspecified site, without rupture: Secondary | ICD-10-CM | POA: Insufficient documentation

## 2017-05-26 ENCOUNTER — Telehealth: Payer: Self-pay | Admitting: Acute Care

## 2017-05-26 NOTE — Telephone Encounter (Signed)
DR. Luan Pulling, Please see results of low-dose screening CT .  We will order and schedule the patient's low-dose screening CT for February 2020.  I wanted to make sure you are aware of the notation of coronary artery disease, and the increased ascending aorta measurement of 4.0 cm.  This can continue to be monitored through annual  lung cancer screening, or you may opt to order a CTA or MRA to further evaluate.  I know you had previously referred the patient to cardiology in 2017, but this was a new finding on this scan.  Please do not hesitate to contact me for any questions or concerns regarding the results of the scan.  As always thank you for the referral..   IMPRESSION: 1. Lung-RADS 1, negative. Continue annual screening with low-dose chest CT without contrast in 12 months. 2. Aortic atherosclerosis (ICD10-170.0). Coronary artery calcification. 3. 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 Recommend annual imaging followup by CTA or MRA. This recommendation follows 2010 ACCF/AHA/AATS/ACR/ASA/SCA/SCAI/SIR/STS/SVM Guidelines for the Diagnosis and Management of Patients with Thoracic Aortic Disease. Circulation. 2010; 121: D664-Q034. 4.  Emphysema (ICD10-J43.9).

## 2017-05-29 ENCOUNTER — Other Ambulatory Visit: Payer: Self-pay | Admitting: Acute Care

## 2017-05-29 DIAGNOSIS — Z87891 Personal history of nicotine dependence: Secondary | ICD-10-CM

## 2017-05-29 DIAGNOSIS — Z122 Encounter for screening for malignant neoplasm of respiratory organs: Secondary | ICD-10-CM

## 2017-06-25 ENCOUNTER — Ambulatory Visit (INDEPENDENT_AMBULATORY_CARE_PROVIDER_SITE_OTHER): Payer: Federal, State, Local not specified - PPO | Admitting: Otolaryngology

## 2017-06-25 DIAGNOSIS — H6123 Impacted cerumen, bilateral: Secondary | ICD-10-CM | POA: Diagnosis not present

## 2017-10-01 ENCOUNTER — Other Ambulatory Visit (HOSPITAL_COMMUNITY): Payer: Self-pay | Admitting: Pulmonary Disease

## 2017-10-01 DIAGNOSIS — R519 Headache, unspecified: Secondary | ICD-10-CM

## 2017-10-01 DIAGNOSIS — R51 Headache: Principal | ICD-10-CM

## 2017-10-05 ENCOUNTER — Ambulatory Visit (HOSPITAL_COMMUNITY)
Admission: RE | Admit: 2017-10-05 | Discharge: 2017-10-05 | Disposition: A | Discharge status: Home / Self Care | Payer: Federal, State, Local not specified - PPO | Source: Ambulatory Visit | Attending: Pulmonary Disease | Admitting: Pulmonary Disease

## 2017-10-05 DIAGNOSIS — R519 Headache, unspecified: Secondary | ICD-10-CM

## 2017-10-05 DIAGNOSIS — R51 Headache: Secondary | ICD-10-CM | POA: Diagnosis not present

## 2017-10-19 ENCOUNTER — Ambulatory Visit (INDEPENDENT_AMBULATORY_CARE_PROVIDER_SITE_OTHER): Payer: Federal, State, Local not specified - PPO | Admitting: Otolaryngology

## 2017-10-19 DIAGNOSIS — H6121 Impacted cerumen, right ear: Secondary | ICD-10-CM

## 2018-01-25 ENCOUNTER — Ambulatory Visit (INDEPENDENT_AMBULATORY_CARE_PROVIDER_SITE_OTHER): Payer: Federal, State, Local not specified - PPO | Admitting: Otolaryngology

## 2018-01-25 DIAGNOSIS — H6123 Impacted cerumen, bilateral: Secondary | ICD-10-CM | POA: Diagnosis not present

## 2018-05-06 ENCOUNTER — Ambulatory Visit (INDEPENDENT_AMBULATORY_CARE_PROVIDER_SITE_OTHER): Payer: Federal, State, Local not specified - PPO | Admitting: Otolaryngology

## 2018-05-06 DIAGNOSIS — H6523 Chronic serous otitis media, bilateral: Secondary | ICD-10-CM | POA: Diagnosis not present

## 2018-05-27 ENCOUNTER — Ambulatory Visit (HOSPITAL_COMMUNITY): Payer: Federal, State, Local not specified - PPO

## 2018-11-01 ENCOUNTER — Other Ambulatory Visit (HOSPITAL_COMMUNITY): Payer: Self-pay | Admitting: Pulmonary Disease

## 2018-11-01 DIAGNOSIS — M25511 Pain in right shoulder: Secondary | ICD-10-CM

## 2018-11-04 ENCOUNTER — Ambulatory Visit (HOSPITAL_COMMUNITY)
Admission: RE | Admit: 2018-11-04 | Discharge: 2018-11-04 | Disposition: A | Discharge status: Home / Self Care | Payer: Federal, State, Local not specified - PPO | Source: Ambulatory Visit | Attending: Pulmonary Disease | Admitting: Pulmonary Disease

## 2018-11-04 ENCOUNTER — Other Ambulatory Visit: Payer: Self-pay

## 2018-11-04 DIAGNOSIS — M25511 Pain in right shoulder: Secondary | ICD-10-CM | POA: Insufficient documentation

## 2018-11-08 ENCOUNTER — Encounter

## 2018-11-08 ENCOUNTER — Ambulatory Visit: Payer: Federal, State, Local not specified - PPO | Admitting: Orthopedic Surgery

## 2018-11-08 ENCOUNTER — Encounter: Payer: Self-pay | Admitting: Orthopedic Surgery

## 2018-11-09 ENCOUNTER — Other Ambulatory Visit: Payer: Self-pay | Admitting: Pulmonary Disease

## 2018-11-09 DIAGNOSIS — M542 Cervicalgia: Secondary | ICD-10-CM

## 2018-11-19 ENCOUNTER — Other Ambulatory Visit: Payer: Federal, State, Local not specified - PPO

## 2018-11-29 ENCOUNTER — Other Ambulatory Visit: Payer: Federal, State, Local not specified - PPO

## 2018-12-01 ENCOUNTER — Telehealth: Payer: Self-pay

## 2018-12-01 NOTE — Telephone Encounter (Signed)
Called patient back to reschedule his appointment per Arbie Cookey, I had to leave a message for him to call the office.

## 2018-12-22 ENCOUNTER — Other Ambulatory Visit: Payer: Self-pay

## 2018-12-22 ENCOUNTER — Ambulatory Visit: Payer: Federal, State, Local not specified - PPO | Admitting: Orthopedic Surgery

## 2018-12-22 ENCOUNTER — Ambulatory Visit: Payer: Federal, State, Local not specified - PPO

## 2018-12-22 VITALS — BP 138/89 | HR 81 | Temp 97.1°F | Ht 74.0 in | Wt 212.0 lb

## 2018-12-22 DIAGNOSIS — M25511 Pain in right shoulder: Secondary | ICD-10-CM

## 2018-12-22 DIAGNOSIS — M75111 Incomplete rotator cuff tear or rupture of right shoulder, not specified as traumatic: Secondary | ICD-10-CM | POA: Diagnosis not present

## 2018-12-22 NOTE — Progress Notes (Signed)
William Joseph  12/22/2018  HISTORY SECTION :  Chief Complaint  Patient presents with  . Neck Pain    Neck pain, radiating down the right arm.   HPI The patient presents for evaluation of painful right shoulder after fall Location peri-acromial deltoid pain right shoulder Duration 1 month Quality dull ache exacerbated by cold Severity moderate to severe Associated with crepitus decreased range of motion  Review of Systems  Constitutional: Positive for diaphoresis.  Cardiovascular: Positive for chest pain.  All other systems reviewed and are negative.    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   Past Surgical History:  Procedure Laterality Date  . COLONOSCOPY N/A 06/29/2015   Procedure: COLONOSCOPY;  Surgeon: Daneil Dolin, MD;  Location: [facility];  Service: Endoscopy;  Laterality: N/A;  9:30 Am - moved to 9:15 - office to notify  . ESOPHAGOGASTRODUODENOSCOPY ENDOSCOPY N/A   . EXPLORATORY LAPAROTOMY    . GLAUCOMA SURGERY Bilateral 11/2013   didnt work  . LYMPH NODE BIOPSY    . POLYPECTOMY  06/29/2015   Procedure: POLYPECTOMY;  Surgeon: Daneil Dolin, MD;  Location: [facility];  Service: Endoscopy;;  descending colon polyp removed via cold snare  . Port-a-cath placement    . Tooth implant      Body mass index is 27.22 kg/m.   No Known Allergies   Current Outpatient Medications:  .  Aspirin-Caffeine (BC FAST PAIN RELIEF PO), Take 1 packet by mouth daily as needed (pain). Reported on 04/26/2015, Disp: , Rfl:  .  dutasteride (AVODART) 0.5 MG capsule, Take 0.5 mg by mouth daily.  , Disp: , Rfl:  .  fluticasone (FLONASE) 50 MCG/ACT nasal spray, Place into both nostrils daily., Disp: , Rfl:  .  loratadine-pseudoephedrine  (CLARITIN-D 24-HOUR) 10-240 MG per 24 hr tablet, Take 1 tablet by mouth. Only as needed, Disp: , Rfl:  .  Multiple Vitamins-Minerals (MULTIVITAMIN GUMMIES ADULT PO), Take 1 capsule by mouth daily. , Disp: , Rfl:  .  polyethylene glycol-electrolytes (TRILYTE) 420 g solution, Take 4,000 mLs by mouth as directed., Disp: 4000 mL, Rfl: 0 .  sodium chloride (OCEAN) 0.65 % nasal spray, Place 1 spray into the nose as needed. Reported on 05/21/2015, Disp: , Rfl:  .  tadalafil (CIALIS) 5 MG tablet, Take 5 mg by mouth daily. , Disp: , Rfl:  .  Tamsulosin HCl (FLOMAX) 0.4 MG CAPS, Take 0.4 mg by mouth daily.  , Disp: , Rfl:    PHYSICAL EXAM SECTION: 1) BP 138/89   Pulse 81   Temp (!) 97.1 F (36.2 C)   Ht 6\' 2"  (1.88 m)   Wt 212 lb (96.2 kg)   BMI 27.22 kg/m   Body mass index is 27.22 kg/m. General appearance: Well-developed well-nourished no gross deformities  2) Cardiovascular normal pulse and perfusion in the upper  extremities normal color without edema  3) Neurologically deep tendon reflexes are equal and normal, no sensation loss or deficits no pathologic reflexes  4) Psychological: Awake alert and oriented x3 mood and affect normal  5) Skin no lacerations or ulcerations no nodularity no palpable masses, no erythema or nodularity  6) Musculoskeletal:   Left shoulder no tenderness but he has some mild rotator cuff weakness range of motion is otherwise normal and the shoulder stable  Right shoulder is tender posteriorly anteriorly  has crepitance stable in abduction external rotation mild weakness in the rotator cuff supraspinatus  MEDICAL DECISION SECTION:  Encounter Diagnoses  Name Primary?  . Right shoulder pain, unspecified chronicity Yes  . Nontraumatic incomplete tear of right rotator cuff     Imaging X-ray shows chronic rotator cuff changes proximal humeral migration 5 mm to a centimeter see report  Plan:  (Rx., Inj., surg., Frx, MRI/CT, XR:2)   Procedure note the  subacromial injection shoulder RIGHT    Verbal consent was obtained to inject the  RIGHT   Shoulder  Timeout was completed to confirm the injection site is a subacromial space of the  RIGHT  shoulder   Medication used Depo-Medrol 40 mg and lidocaine 1% 3 cc  Anesthesia was provided by ethyl chloride  The injection was performed in the RIGHT  posterior subacromial space. After pinning the skin with alcohol and anesthetized the skin with ethyl chloride the subacromial space was injected using a 20-gauge needle. There were no complications  Sterile dressing was applied.  Follow-up in 6 weeks  3:21 PM Arther Abbott, MD  12/22/2018

## 2018-12-23 ENCOUNTER — Ambulatory Visit (INDEPENDENT_AMBULATORY_CARE_PROVIDER_SITE_OTHER): Payer: Federal, State, Local not specified - PPO | Admitting: Otolaryngology

## 2018-12-23 DIAGNOSIS — H6123 Impacted cerumen, bilateral: Secondary | ICD-10-CM

## 2018-12-23 DIAGNOSIS — H903 Sensorineural hearing loss, bilateral: Secondary | ICD-10-CM | POA: Diagnosis not present

## 2019-02-02 ENCOUNTER — Ambulatory Visit: Payer: Federal, State, Local not specified - PPO | Admitting: Orthopedic Surgery

## 2019-02-02 ENCOUNTER — Other Ambulatory Visit: Payer: Self-pay

## 2019-02-02 VITALS — BP 143/75 | HR 70 | Temp 97.2°F | Ht 74.0 in | Wt 221.0 lb

## 2019-02-02 DIAGNOSIS — G5601 Carpal tunnel syndrome, right upper limb: Secondary | ICD-10-CM

## 2019-02-02 DIAGNOSIS — M25511 Pain in right shoulder: Secondary | ICD-10-CM | POA: Diagnosis not present

## 2019-02-02 NOTE — Progress Notes (Signed)
Chief complaint recheck right shoulder new complaint pain and paresthesias right index finger thumb and dorsum right hand  73 year old male had an injection in his right shoulder which did well in terms of relieving his symptoms after initial steroid flare  Presents now with a 6-week history of mild pain and paresthesias right upper extremity worse at night somewhat relieved with a brace not associated with any weakness in the hand denies any trauma  He put a brace on his wrist and it seemed to help but he only wears it intermittently  Review of Systems  Constitutional: Negative for chills, fever, malaise/fatigue and weight loss.  Musculoskeletal:       Shoulder pain improved  Neurological: Positive for tingling.   Past Medical History:  Diagnosis Date  . Anxiety   . Essential hypertension, benign   . History of cardiac catheterization    Normal coronaries March 2011  . History of non-Hodgkin's lymphoma 06/26/2009   Qualifier: Diagnosis of  By: Dwaine Gale, CNA, [place]    . Non Hodgkin's lymphoma ([place])   . Nonerosive nonspecific gastritis    EGD - Dr. Laural Golden  . Obsessive-compulsive disorder   . Palpitations   . Sleep apnea     BP (!) 143/75   Pulse 70   Temp (!) 97.2 F (36.2 C)   Ht 6\' 2"  (1.88 m)   Wt 221 lb (100.2 kg)   BMI 28.37 kg/m   Right hand Decreased sensation at the tip of the right index finger somewhat painful at the base of the right thumb tenderness over the carpal tunnel Range of motion remains normal Grip strength is excellent there is no atrophy in the hand Wrist is stable Decreased sensation at the tip of the index finger Color capillary refill normal Skin shows no rash lesion ulceration  Left hand Normal range of motion no tenderness no instability normal muscle tone neurovascular is intact skin is intact  Probable carpal tunnel syndrome  Wear brace  Right shoulder resolved  Encounter Diagnoses  Name Primary?  . Right shoulder pain, unspecified  chronicity Yes  . Carpal tunnel syndrome of right wrist      Follow-up 4 to 6 weeks

## 2019-02-02 NOTE — Patient Instructions (Signed)
Carpal Tunnel Syndrome  Carpal tunnel syndrome is a condition that causes pain in your hand and arm. The carpal tunnel is a narrow area located on the palm side of your wrist. Repeated wrist motion or certain diseases may cause swelling within the tunnel. This swelling pinches the main nerve in the wrist (median nerve). What are the causes? This condition may be caused by:  Repeated wrist motions.  Wrist injuries.  Arthritis.  A cyst or tumor in the carpal tunnel.  Fluid buildup during pregnancy. Sometimes the cause of this condition is not known. What increases the risk? The following factors may make you more likely to develop this condition:  Having a job, such as being a butcher or a cashier, that requires you to repeatedly move your wrist in the same motion.  Being a woman.  Having certain conditions, such as: ? Diabetes. ? Obesity. ? An underactive thyroid (hypothyroidism). ? Kidney failure. What are the signs or symptoms? Symptoms of this condition include:  A tingling feeling in your fingers, especially in your thumb, index, and middle fingers.  Tingling or numbness in your hand.  An aching feeling in your entire arm, especially when your wrist and elbow are bent for a long time.  Wrist pain that goes up your arm to your shoulder.  Pain that goes down into your palm or fingers.  A weak feeling in your hands. You may have trouble grabbing and holding items. Your symptoms may feel worse during the night. How is this diagnosed? This condition is diagnosed with a medical history and physical exam. You may also have tests, including:  Electromyogram (EMG). This test measures electrical signals sent by your nerves into the muscles.  Nerve conduction study. This test measures how well electrical signals pass through your nerves.  Imaging tests, such as X-rays, ultrasound, and MRI. These tests check for possible causes of your condition. How is this treated? This  condition may be treated with:  Lifestyle changes. It is important to stop or change the activity that caused your condition.  Doing exercise and activities to strengthen your muscles and bones (physical therapy).  Learning how to use your hand again after diagnosis (occupational therapy).  Medicines for pain and inflammation. This may include medicine that is injected into your wrist.  A wrist splint.  Surgery. Follow these instructions at home: If you have a splint:  Wear the splint as told by your health care provider. Remove it only as told by your health care provider.  Loosen the splint if your fingers tingle, become numb, or turn cold and blue.  Keep the splint clean.  If the splint is not waterproof: ? Do not let it get wet. ? Cover it with a watertight covering when you take a bath or shower. Managing pain, stiffness, and swelling   If directed, put ice on the painful area: ? If you have a removable splint, remove it as told by your health care provider. ? Put ice in a plastic bag. ? Place a towel between your skin and the bag. ? Leave the ice on for 20 minutes, 2-3 times per day. General instructions  Take over-the-counter and prescription medicines only as told by your health care provider.  Rest your wrist from any activity that may be causing your pain. If your condition is work related, talk with your employer about changes that can be made, such as getting a wrist pad to use while typing.  Do any exercises as told   by your health care provider, physical therapist, or occupational therapist.  Keep all follow-up visits as told by your health care provider. This is important. Contact a health care provider if:  You have new symptoms.  Your pain is not controlled with medicines.  Your symptoms get worse. Get help right away if:  You have severe numbness or tingling in your wrist or hand. Summary  Carpal tunnel syndrome is a condition that causes pain in  your hand and arm.  It is usually caused by repeated wrist motions.  Lifestyle changes and medicines are used to treat carpal tunnel syndrome. Surgery may be recommended.  Follow your health care provider's instructions about wearing a splint, resting from activity, keeping follow-up visits, and calling for help. This information is not intended to replace advice given to you by your health care provider. Make sure you discuss any questions you have with your health care provider. Document Released: 03/21/2000 Document Revised: 07/31/2017 Document Reviewed: 07/31/2017 Elsevier Patient Education  2020 [employer]  

## 2019-03-07 ENCOUNTER — Ambulatory Visit: Payer: Federal, State, Local not specified - PPO | Admitting: Orthopedic Surgery

## 2019-03-07 ENCOUNTER — Encounter: Payer: Self-pay | Admitting: Orthopedic Surgery

## 2019-03-07 ENCOUNTER — Other Ambulatory Visit: Payer: Self-pay

## 2019-03-07 VITALS — BP 171/90 | HR 79 | Ht 74.0 in | Wt 224.0 lb

## 2019-03-07 DIAGNOSIS — M25511 Pain in right shoulder: Secondary | ICD-10-CM | POA: Diagnosis not present

## 2019-03-07 DIAGNOSIS — G5601 Carpal tunnel syndrome, right upper limb: Secondary | ICD-10-CM | POA: Diagnosis not present

## 2019-03-07 NOTE — Progress Notes (Signed)
Encounter Diagnoses  Name Primary?  . Carpal tunnel syndrome of right wrist Yes  . Right shoulder pain, unspecified chronicity    Chief Complaint  Patient presents with  . Shoulder Pain    right/ feels better   . Hand Pain    right/ using compression glove, feels better     Follow-up regarding right shoulder and pain right hand possible carpal tunnel.  Patient says his shoulder feels better is regained his range of motion   Review of systems no tingling running down the neck to the hand and no neck pain at present  Exam He says his hand feels better with a compression glove no symptoms at present  He moves his shoulder in all planes without pain he opens and closes the hand without any discomfort   Diagnosis   Encounter Diagnoses  Name Primary?  . Carpal tunnel syndrome of right wrist Yes  . Right shoulder pain, unspecified chronicity    recommend follow-up as needed

## 2019-06-15 ENCOUNTER — Ambulatory Visit: Payer: Federal, State, Local not specified - PPO | Admitting: Family Medicine

## 2019-06-23 ENCOUNTER — Encounter (INDEPENDENT_AMBULATORY_CARE_PROVIDER_SITE_OTHER): Payer: Self-pay | Admitting: Nurse Practitioner

## 2019-06-23 ENCOUNTER — Ambulatory Visit (INDEPENDENT_AMBULATORY_CARE_PROVIDER_SITE_OTHER): Payer: Federal, State, Local not specified - PPO | Admitting: Nurse Practitioner

## 2019-06-23 ENCOUNTER — Other Ambulatory Visit: Payer: Self-pay

## 2019-06-23 VITALS — BP 145/80 | HR 75 | Temp 98.8°F | Ht 74.0 in | Wt 227.8 lb

## 2019-06-23 DIAGNOSIS — N401 Enlarged prostate with lower urinary tract symptoms: Secondary | ICD-10-CM

## 2019-06-23 DIAGNOSIS — R002 Palpitations: Secondary | ICD-10-CM

## 2019-06-23 DIAGNOSIS — R5383 Other fatigue: Secondary | ICD-10-CM

## 2019-06-23 DIAGNOSIS — Z139 Encounter for screening, unspecified: Secondary | ICD-10-CM

## 2019-06-23 DIAGNOSIS — N400 Enlarged prostate without lower urinary tract symptoms: Secondary | ICD-10-CM | POA: Insufficient documentation

## 2019-06-23 DIAGNOSIS — I10 Essential (primary) hypertension: Secondary | ICD-10-CM

## 2019-06-23 DIAGNOSIS — M25572 Pain in left ankle and joints of left foot: Secondary | ICD-10-CM

## 2019-06-23 DIAGNOSIS — Z131 Encounter for screening for diabetes mellitus: Secondary | ICD-10-CM

## 2019-06-23 DIAGNOSIS — Z13220 Encounter for screening for lipoid disorders: Secondary | ICD-10-CM

## 2019-06-23 DIAGNOSIS — R3914 Feeling of incomplete bladder emptying: Secondary | ICD-10-CM

## 2019-06-23 MED ORDER — TADALAFIL 5 MG PO TABS: 5.0000 mg | ORAL_TABLET | Freq: Every day | ORAL | 0 refills | Status: DC

## 2019-06-23 MED ORDER — IBUPROFEN 800 MG PO TABS: 800.0000 mg | ORAL_TABLET | Freq: Three times a day (TID) | ORAL | 0 refills | Status: DC | PRN

## 2019-06-23 NOTE — Progress Notes (Addendum)
Subjective:  Patient ID: William Joseph, male    DOB: 1945/07/19  Age: 74 y.o. MRN: [id number]  CC:  Chief Complaint  Patient presents with  . Establish Care  . Foot Pain    swelling in the achillies areas      HPI  This patient comes in today for the above.  He is establishing care today as a new patient.  He was being cared for by Dr. Luan Pulling, but Dr. Luan Pulling has since retired.  His main concerns today are pain to his left ankle and refill and prior approval for Cialis is for the treatment of BPH.  BPH: He has a history of BPH and is currently taking tamsulosin, Avodart, and Cialis to treat this.  He tells me that he was initially started on Avodart, but the symptom relief was not optimal so tamsulosin was added to his regimen.  He tells me he still continued to have symptoms of BPH and so with the addition of Cialis his symptoms are much more controlled now.  He tells me his symptoms worsen when he misses even 1 dose of any of the 3 of his medications.  His symptom is incomplete emptying of bladder, nocturia, weakened urine stream.  Left ankle pain: He tells me that yesterday he was getting out of bed, and when he stood up he heard a popping noise.  Since then he has had pain in his ankle with ambulation and bearing weight on it.  He tells me the pain is up to a 10 over/10 in intensity.  He tells me is an aching pain.  He denies any color changes.  He tells me he is able to move his foot and ankle.  He has not tried anything to treat the pain.   Past Medical History:  Diagnosis Date  . Anxiety   . Essential hypertension, benign   . History of cardiac catheterization    Normal coronaries March 2011  . History of non-Hodgkin's lymphoma 06/26/2009   Qualifier: Diagnosis of  By: Dwaine Gale, CNA, [place]    . Non Hodgkin's lymphoma ([place])   . Nonerosive nonspecific gastritis    EGD - Dr. Laural Golden  . Obsessive-compulsive disorder   . Palpitations   . Sleep apnea        Family History  Problem Relation Age of Onset  . Diabetes Father   . Coronary artery disease Mother   . Hypertension Brother   . Mental illness Maternal Uncle   . Dementia Maternal Grandfather   . Seizures Cousin   . Drug abuse Other   . Seizures Maternal Uncle   . ADD / ADHD Neg Hx   . Alcohol abuse Neg Hx   . Anxiety disorder Neg Hx   . Bipolar disorder Neg Hx   . Depression Neg Hx   . OCD Neg Hx   . Paranoid behavior Neg Hx   . Schizophrenia Neg Hx   . Sexual abuse Neg Hx   . Physical abuse Neg Hx     Social History   Social History Narrative  . Not on file   Social History   Tobacco Use  . Smoking status: Light Tobacco Smoker    Packs/day: 1.00    Years: 1.00    Pack years: 1.00    Types: Cigars    Start date: 09/09/1964  . Smokeless tobacco: Never Used  . Tobacco comment: Puffs on cigars every so often.   Substance Use Topics  . Alcohol use:  Yes    Alcohol/week: 0.0 standard drinks    Comment: glass of wine occasionally     Current Meds  Medication Sig  . Aspirin-Caffeine (BC FAST PAIN RELIEF PO) Take 1 packet by mouth daily as needed (pain). Reported on 04/26/2015  . B Complex Vitamins (VITAMIN B COMPLEX PO) Take by mouth.  . dutasteride (AVODART) 0.5 MG capsule Take 0.5 mg by mouth daily.    . fluticasone (FLONASE) 50 MCG/ACT nasal spray Place into both nostrils daily.  Marland Kitchen loratadine-pseudoephedrine (CLARITIN-D 24-HOUR) 10-240 MG per 24 hr tablet Take 1 tablet by mouth. Only as needed  . Multiple Vitamins-Minerals (MULTIVITAMIN GUMMIES ADULT PO) Take 1 capsule by mouth daily.   . Netarsudil-Latanoprost (ROCKLATAN) 0.02-0.005 % SOLN Apply to eye 1 day or 1 dose.  . sodium chloride (OCEAN) 0.65 % nasal spray Place 1 spray into the nose as needed. Reported on 05/21/2015  . tadalafil (CIALIS) 5 MG tablet Take 5 mg by mouth daily.   . Tamsulosin HCl (FLOMAX) 0.4 MG CAPS Take 0.4 mg by mouth daily.      ROS:  Review of Systems  Constitutional: Negative for  fever, malaise/fatigue and weight loss.  Eyes: Positive for blurred vision.  Respiratory: Positive for shortness of breath. Negative for cough and wheezing.   Cardiovascular: Negative for chest pain and palpitations.  Gastrointestinal: Negative for abdominal pain and blood in stool.     Objective:   Today's Vitals: BP (!) 145/80 (BP Location: Right Arm, Patient Position: Sitting, Cuff Size: Normal)   Pulse 75   Temp 98.8 F (37.1 C) (Oral)   Ht 6' 2" (1.88 m)   Wt 227 lb 12.8 oz (103.3 kg)   BMI 29.25 kg/m  Vitals with BMI 06/23/2019 03/07/2019 02/02/2019  Height 6' 2" 6' 2" 6' 2"  Weight 227 lbs 13 oz 224 lbs 221 lbs  BMI 29.24 04.88 89.16  Systolic 945 038 882  Diastolic 80 90 75  Pulse 75 79 70  Some encounter information is confidential and restricted. Go to Review Flowsheets activity to see all data.     Physical Exam Vitals reviewed.  Constitutional:      Appearance: Normal appearance.  HENT:     Head: Normocephalic and atraumatic.  Cardiovascular:     Rate and Rhythm: Normal rate and regular rhythm.     Pulses:          Dorsalis pedis pulses are 2+ on the left side.       Posterior tibial pulses are 2+ on the left side.  Pulmonary:     Effort: Pulmonary effort is normal.     Breath sounds: Normal breath sounds.  Musculoskeletal:     Cervical back: Neck supple.     Left ankle: Swelling present. No deformity or ecchymosis. Tenderness present over the medial malleolus. Normal range of motion.     Left Achilles Tendon: Normal.  Skin:    General: Skin is warm and dry.  Neurological:     Mental Status: He is alert and oriented to person, place, and time.     Sensory: Sensation is intact.     Motor: Motor function is intact.  Psychiatric:        Mood and Affect: Mood normal.        Behavior: Behavior normal.        Thought Content: Thought content normal.        Judgment: Judgment normal.          Assessment and Plan  1. Acute left ankle pain   2.  Essential hypertension, benign   3. Benign prostatic hyperplasia with incomplete bladder emptying   4. Screening for condition   5. Screening for diabetes mellitus   6. Screening, lipid   7. Fatigue, unspecified type   8. Palpitation      Plan: 1.  I have a fairly low suspicion for fracture, however he does have bony tenderness and pain with ambulation.  I will send him for x-ray of the left ankle.  For now I have recommended that he rests, ices, uses a compression wrap, and elevates his ankle.  I have also prescribed ibuprofen that he can take up to 3 times a day as needed for pain relief.  I have recommended that if his symptoms do not improve or worsen over the next week to week and a half that notify us and we may need to refer him to a specialist.  He tells me he understands.  2.  He tells me that he does not have a history of elevated blood pressure.  However I see in his chart that essential hypertension was added to his chart back in 2011.  He is not currently on any medication to treat his blood pressure.  Today, he did tell me that he drank caffeine right before he came to the office.  Blood pressure is elevated today.  We will hold off on starting blood pressure medication, however I asked him that when he comes back in approximately 1 month that he avoids caffeine intake for approximately 90 minutes for his appointment time.  I also discussed red flag symptoms that would warrant calling 911 that could be associated with heart attack or stroke, and he tells me he understands.  We will collect blood work for further evaluation.  3.  He will continue on his current medication regimen as prescribed.  4.-8.  We will collect blood work for further evaluation today.  As for his palpitations, he tells me they have been going on for approximately 3 years and have remained consistent in characteristics.  He tells me he has undergone evaluation for this with cardiology, with no significant  abnormalities noted.  For now we will collect blood work for further evaluation and he was encouraged to let me know if his symptoms worsen between now and his next appointment.   Tests ordered Orders Placed This Encounter  Procedures  . DG Ankle Complete Left  . CBC  . CMP with eGFR([facility])  . Lipid Panel  . Hemoglobin A1c  . TSH  . Vitamin D, 25-hydroxy  . T3, Free  . T4, Free      Meds ordered this encounter  Medications  . ibuprofen (ADVIL) 800 MG tablet    Sig: Take 1 tablet (800 mg total) by mouth every 8 (eight) hours as needed for moderate pain.    Dispense:  24 tablet    Refill:  0    Order Specific Question:   Supervising Provider    Answer:   Doree Albee [[id number]]    Patient to follow-up in 1 month or sooner if needed.  Ailene Ards, NP

## 2019-06-23 NOTE — Addendum Note (Signed)
Addended by: Ailene Ards on: 06/23/2019 02:17 PM   Modules accepted: Orders

## 2019-06-24 ENCOUNTER — Ambulatory Visit (HOSPITAL_COMMUNITY)
Admission: RE | Admit: 2019-06-24 | Discharge: 2019-06-24 | Disposition: A | Discharge status: Home / Self Care | Payer: Federal, State, Local not specified - PPO | Source: Ambulatory Visit | Attending: Nurse Practitioner | Admitting: Nurse Practitioner

## 2019-06-24 DIAGNOSIS — M25572 Pain in left ankle and joints of left foot: Secondary | ICD-10-CM | POA: Diagnosis not present

## 2019-06-24 LAB — CBC
HCT: 40 % (ref 38.5–50.0)
Hemoglobin: 13 g/dL — ABNORMAL LOW (ref 13.2–17.1)
MCH: 29.7 pg (ref 27.0–33.0)
MCHC: 32.5 g/dL (ref 32.0–36.0)
MCV: 91.3 fL (ref 80.0–100.0)
MPV: 11.3 fL (ref 7.5–12.5)
Platelets: 175 10*3/uL (ref 140–400)
RBC: 4.38 10*6/uL (ref 4.20–5.80)
RDW: 12.8 % (ref 11.0–15.0)
WBC: 5.4 10*3/uL (ref 3.8–10.8)

## 2019-06-24 LAB — T3, FREE: T3, Free: 3.3 pg/mL (ref 2.3–4.2)

## 2019-06-24 LAB — COMPLETE METABOLIC PANEL WITH GFR
AG Ratio: 1.5 (calc) (ref 1.0–2.5)
ALT: 12 U/L (ref 9–46)
AST: 14 U/L (ref 10–35)
Albumin: 4.3 g/dL (ref 3.6–5.1)
Alkaline phosphatase (APISO): 52 U/L (ref 35–144)
BUN: 16 mg/dL (ref 7–25)
CO2: 26 mmol/L (ref 20–32)
Calcium: 9.8 mg/dL (ref 8.6–10.3)
Chloride: 106 mmol/L (ref 98–110)
Creat: 0.97 mg/dL (ref 0.70–1.18)
GFR, Est African American: 91 mL/min/{1.73_m2} (ref >=60)
GFR, Est Non African American: 78 mL/min/{1.73_m2} (ref >=60)
Globulin: 2.8 g/dL (calc) (ref 1.9–3.7)
Glucose, Bld: 96 mg/dL (ref 65–99)
Potassium: 4.3 mmol/L (ref 3.5–5.3)
Sodium: 140 mmol/L (ref 135–146)
Total Bilirubin: 0.5 mg/dL (ref 0.2–1.2)
Total Protein: 7.1 g/dL (ref 6.1–8.1)

## 2019-06-24 LAB — TSH: TSH: 0.92 mIU/L (ref 0.40–4.50)

## 2019-06-24 LAB — HEMOGLOBIN A1C
Hgb A1c MFr Bld: 5.9 % of total Hgb — ABNORMAL HIGH (ref <5.7)
Mean Plasma Glucose: 123 (calc)
eAG (mmol/L): 6.8 (calc)

## 2019-06-24 LAB — LIPID PANEL
Cholesterol: 227 mg/dL — ABNORMAL HIGH (ref <200)
HDL: 42 mg/dL (ref >=40)
LDL Cholesterol (Calc): 162 mg/dL (calc) — ABNORMAL HIGH
Non-HDL Cholesterol (Calc): 185 mg/dL (calc) — ABNORMAL HIGH (ref <130)
Total CHOL/HDL Ratio: 5.4 (calc) — ABNORMAL HIGH (ref <5.0)
Triglycerides: 111 mg/dL (ref <150)

## 2019-06-24 LAB — T4, FREE: Free T4: 1.3 ng/dL (ref 0.8–1.8)

## 2019-06-28 ENCOUNTER — Telehealth (INDEPENDENT_AMBULATORY_CARE_PROVIDER_SITE_OTHER): Payer: Self-pay

## 2019-06-28 NOTE — Telephone Encounter (Signed)
Yes that should be fine as long as he takes the naproxen with food, never on an empty stomach.  I would only take it for 3 to 5 days.  If it is not better after this, he needs to be seen.

## 2019-06-28 NOTE — Telephone Encounter (Signed)
William Joseph is calling on behalf of [name] asking If it would be ok if he took Naproxen for his foot, his xray was negative for fracture but its still swelling some with some discomfort, please advise?

## 2019-06-29 NOTE — Telephone Encounter (Signed)
Tried to call her back for a couple days and Im getting no answer nor machine

## 2019-06-30 ENCOUNTER — Telehealth (INDEPENDENT_AMBULATORY_CARE_PROVIDER_SITE_OTHER): Payer: Self-pay

## 2019-06-30 NOTE — Telephone Encounter (Signed)
Returning call back to see if phone call message. Per note: Yes that should be fine as long as he takes the naproxen with food, never on an empty stomach.  I would only take it for 3 to 5 days.  If it is not better after this, he needs to be seen.

## 2019-07-26 ENCOUNTER — Emergency Department (HOSPITAL_COMMUNITY): Payer: Federal, State, Local not specified - PPO

## 2019-07-26 ENCOUNTER — Emergency Department (HOSPITAL_COMMUNITY)
Admission: EM | Admit: 2019-07-26 | Discharge: 2019-07-26 | Disposition: A | Discharge status: Home / Self Care | Payer: Federal, State, Local not specified - PPO | Attending: Emergency Medicine | Admitting: Emergency Medicine

## 2019-07-26 ENCOUNTER — Encounter: Payer: Self-pay | Admitting: Emergency Medicine

## 2019-07-26 ENCOUNTER — Encounter (HOSPITAL_COMMUNITY): Payer: Self-pay | Admitting: *Deleted

## 2019-07-26 ENCOUNTER — Ambulatory Visit
Admission: EM | Admit: 2019-07-26 | Discharge: 2019-07-26 | Disposition: A | Discharge status: Home / Self Care | Payer: Federal, State, Local not specified - PPO | Source: Home / Self Care

## 2019-07-26 ENCOUNTER — Other Ambulatory Visit: Payer: Self-pay

## 2019-07-26 DIAGNOSIS — R0789 Other chest pain: Secondary | ICD-10-CM | POA: Diagnosis not present

## 2019-07-26 DIAGNOSIS — R42 Dizziness and giddiness: Secondary | ICD-10-CM | POA: Insufficient documentation

## 2019-07-26 DIAGNOSIS — R079 Chest pain, unspecified: Secondary | ICD-10-CM | POA: Diagnosis not present

## 2019-07-26 DIAGNOSIS — Z8572 Personal history of non-Hodgkin lymphomas: Secondary | ICD-10-CM | POA: Diagnosis not present

## 2019-07-26 DIAGNOSIS — I10 Essential (primary) hypertension: Secondary | ICD-10-CM | POA: Insufficient documentation

## 2019-07-26 DIAGNOSIS — G44209 Tension-type headache, unspecified, not intractable: Secondary | ICD-10-CM | POA: Diagnosis not present

## 2019-07-26 DIAGNOSIS — F17290 Nicotine dependence, other tobacco product, uncomplicated: Secondary | ICD-10-CM | POA: Insufficient documentation

## 2019-07-26 DIAGNOSIS — R519 Headache, unspecified: Secondary | ICD-10-CM | POA: Insufficient documentation

## 2019-07-26 LAB — CBC WITH DIFFERENTIAL/PLATELET
Abs Immature Granulocytes: 0.01 10*3/uL (ref 0.00–0.07)
Basophils Absolute: 0 10*3/uL (ref 0.0–0.1)
Basophils Relative: 1 %
Eosinophils Absolute: 0.2 10*3/uL (ref 0.0–0.5)
Eosinophils Relative: 3 %
HCT: 40.7 % (ref 39.0–52.0)
Hemoglobin: 12.7 g/dL — ABNORMAL LOW (ref 13.0–17.0)
Immature Granulocytes: 0 %
Lymphocytes Relative: 37 %
Lymphs Abs: 2.1 10*3/uL (ref 0.7–4.0)
MCH: 30.3 pg (ref 26.0–34.0)
MCHC: 31.2 g/dL (ref 30.0–36.0)
MCV: 97.1 fL (ref 80.0–100.0)
Monocytes Absolute: 0.5 10*3/uL (ref 0.1–1.0)
Monocytes Relative: 9 %
Neutro Abs: 2.9 10*3/uL (ref 1.7–7.7)
Neutrophils Relative %: 50 %
Platelets: 169 10*3/uL (ref 150–400)
RBC: 4.19 MIL/uL — ABNORMAL LOW (ref 4.22–5.81)
RDW: 14.4 % (ref 11.5–15.5)
WBC: 5.8 10*3/uL (ref 4.0–10.5)
nRBC: 0 % (ref 0.0–0.2)

## 2019-07-26 LAB — BASIC METABOLIC PANEL
Anion gap: 9 (ref 5–15)
BUN: 16 mg/dL (ref 8–23)
CO2: 23 mmol/L (ref 22–32)
Calcium: 9.1 mg/dL (ref 8.9–10.3)
Chloride: 108 mmol/L (ref 98–111)
Creatinine, Ser: 0.96 mg/dL (ref 0.61–1.24)
GFR calc Af Amer: >60 mL/min (ref >60)
GFR calc non Af Amer: >60 mL/min (ref >60)
Glucose, Bld: 93 mg/dL (ref 70–99)
Potassium: 4.1 mmol/L (ref 3.5–5.1)
Sodium: 140 mmol/L (ref 135–145)

## 2019-07-26 LAB — TROPONIN I (HIGH SENSITIVITY)
Troponin I (High Sensitivity): 23 ng/L — ABNORMAL HIGH (ref <18)
Troponin I (High Sensitivity): 23 ng/L — ABNORMAL HIGH (ref <18)

## 2019-07-26 MED ORDER — MECLIZINE HCL 25 MG PO TABS: 25.0000 mg | ORAL_TABLET | Freq: Three times a day (TID) | ORAL | 0 refills | Status: DC | PRN

## 2019-07-26 MED ORDER — MECLIZINE HCL 12.5 MG PO TABS
25.0000 mg | ORAL_TABLET | Freq: Once | ORAL | Status: AC
  Administered 2019-07-26: 25 mg via ORAL
  Filled 2019-07-26: qty 2

## 2019-07-26 NOTE — ED Triage Notes (Signed)
Pt was just seen at urgent care for headache, dizziness and chest pain and was told to come here for evaluation; pt states his BP was elevated there

## 2019-07-26 NOTE — ED Provider Notes (Signed)
Medical screening examination/treatment/procedure(s) were conducted as a shared visit with non-physician practitioner(s) and myself.  I personally evaluated the patient during the encounter.  Clinical Impression:   Final diagnoses:  Acute non intractable tension-type headache  Dizziness  Chest pain, unspecified type    This patient is a 74 year old male presenting from urgent care after being seen for headache dizziness and some chest pain which occurred prior to arrival.  The patient is not having any chest pain at this time, it is completely gone away and he denies any prior history of cardiac disease.  He does report that the dizziness feels like his eyes have to catch up with his head when he turns his head to the side.  After getting meclizine here that is completely gone away but he does have some residual headache at the bridge of the nose and just on the forehead.  He does have a mucoid retention cyst in the left maxillary sinus, I informed the patient of this.  He has a clear heart and lung exam, no findings on EKG of concern, troponin is negative and unchanged from first which was drawn 2 hours ago.  Stable for discharge, no signs of ischemia, patient agreeable to follow-up in the outpatient setting, given cardiology follow-up.  Stable for discharge   EKG Interpretation  Date/Time:  Tuesday July 26 2019 16:30:28 EDT Ventricular Rate:  60 PR Interval:    QRS Duration: 99 QT Interval:  412 QTC Calculation: 412 R Axis:   13 Text Interpretation: Sinus rhythm Left ventricular hypertrophy since last tracing no significant change Confirmed by Noemi Chapel [phone]) on 07/26/2019 7:53:55 PM          Noemi Chapel, MD 07/30/19 [phone]

## 2019-07-26 NOTE — Discharge Instructions (Addendum)
Your labs and CT imaging are reassuring tonight but you will benefit from seeing a Cardiologist for further testing to make sure you are heart healthy.  Your ekg and lab tests are reassuring here.  You may continue to use the meclizine if your "dizzy" sensation persists.  Use as directed.

## 2019-07-26 NOTE — Discharge Instructions (Signed)
Patient was discharged to go to ED for further evaluation

## 2019-07-26 NOTE — ED Provider Notes (Signed)
[facility] EMERGENCY DEPARTMENT Provider Note   CSN: [id number] Arrival date & time: 07/26/19  1535     History Chief Complaint  Patient presents with  . URI    William Joseph is a 74 y.o. male with a history of hypertension, non-Hodgkin's lymphoma, history of gastritis and sleep apnea who was seen at our local urgent care center and sent here for further evaluation.  He describes a several day history of nasal congestion and intermittent sinus pain localized to the left forehead region.  He woke last night with a severe left-sided headache, he sat up for a while and the symptoms resolved.  He went back to bed but woke again this morning with severe headache.  He also describes an approximate 20-minute episode of chest pressure associated with shortness of breath which started around 10 AM this morning.  He had been outdoors and was walking up a flight of steps when this symptom occurred.  He has had no chest pain or pressure since, but has noticed shortness of breath when outdoors, then when he gets in his car and runs an air conditioner or gets into a cool building this symptom improves.  He denies cough, fevers or chills, no wheezing.  His nasal congestion includes clear rhinorrhea, nonbloody or purulent.  He denies orthopnea or peripheral edema.  His wife at the bedside has noted he has had reduced exercise tolerance, stating they go on frequent walks but he has been having increased shortness of breath with these walks.  The headache is currently improved, was migratory, initially this morning it was right-sided now he describes a slight nagging above his left eyebrow.  He denies focal weakness, but describes dizziness, specifically when he turns his head he has noted takes a second for his "eyes to catch up".  He denies vertigo.  The history is provided by the patient and the spouse.    HPI: A 54 year old patient with a history of hypertension presents for evaluation of chest pain.  Initial onset of pain was more than 6 hours ago. The patient's chest pain is described as heaviness/pressure/tightness and is not worse with exertion. The patient's chest pain is middle- or left-sided, is not well-localized, is not sharp and does not radiate to the arms/jaw/neck. The patient does not complain of nausea and denies diaphoresis. The patient has no history of stroke, has no history of peripheral artery disease, has not smoked in the past 90 days, denies any history of treated diabetes, has no relevant family history of coronary artery disease (first degree relative at less than age 81), has no history of hypercholesterolemia and does not have an elevated BMI (>=30).   Past Medical History:  Diagnosis Date  . Anxiety   . Essential hypertension, benign   . History of cardiac catheterization    Normal coronaries March 2011  . History of non-Hodgkin's lymphoma 06/26/2009   Qualifier: Diagnosis of  By: Dwaine Gale, CNA, [place]    . Non Hodgkin's lymphoma ([place])   . Nonerosive nonspecific gastritis    EGD - Dr. Laural Golden  . Obsessive-compulsive disorder   . Palpitations   . Sleep apnea     Patient Active Problem List   Diagnosis Date Noted  . BPH (benign prostatic hyperplasia) 06/23/2019  . History of colonic polyps   . Weakness of both legs 11/22/2013  . Unstable balance 11/22/2013  . Dysthymia 06/17/2012  . Shortness of breath 12/17/2011  . Palpitations 12/17/2011  . History of non-Hodgkin's lymphoma 06/26/2009  .  ANXIETY 06/26/2009  . Essential hypertension, benign 06/26/2009  . SLEEP APNEA 06/26/2009    Past Surgical History:  Procedure Laterality Date  . COLONOSCOPY N/A 06/29/2015   Procedure: COLONOSCOPY;  Surgeon: Daneil Dolin, MD;  Location: [facility];  Service: Endoscopy;  Laterality: N/A;  9:30 Am - moved to 9:15 - office to notify  . ESOPHAGOGASTRODUODENOSCOPY ENDOSCOPY N/A   . EXPLORATORY LAPAROTOMY    . GLAUCOMA SURGERY Bilateral 11/2013   didnt work  . LYMPH  NODE BIOPSY    . POLYPECTOMY  06/29/2015   Procedure: POLYPECTOMY;  Surgeon: Daneil Dolin, MD;  Location: [facility];  Service: Endoscopy;;  descending colon polyp removed via cold snare  . Port-a-cath placement    . Tooth implant         Family History  Problem Relation Age of Onset  . Diabetes Father   . Coronary artery disease Mother   . Hypertension Brother   . Mental illness Maternal Uncle   . Dementia Maternal Grandfather   . Seizures Cousin   . Drug abuse Other   . Seizures Maternal Uncle   . ADD / ADHD Neg Hx   . Alcohol abuse Neg Hx   . Anxiety disorder Neg Hx   . Bipolar disorder Neg Hx   . Depression Neg Hx   . OCD Neg Hx   . Paranoid behavior Neg Hx   . Schizophrenia Neg Hx   . Sexual abuse Neg Hx   . Physical abuse Neg Hx     Social History   Tobacco Use  . Smoking status: Light Tobacco Smoker    Packs/day: 1.00    Years: 1.00    Pack years: 1.00    Types: Cigars    Start date: 09/09/1964  . Smokeless tobacco: Never Used  . Tobacco comment: Puffs on cigars every so often.   Substance Use Topics  . Alcohol use: Yes    Alcohol/week: 0.0 standard drinks    Comment: glass of wine occasionally  . Drug use: No    Home Medications Prior to Admission medications   Medication Sig Start Date End Date Taking? Authorizing Provider  Aspirin-Caffeine (BC FAST PAIN RELIEF PO) Take 1 packet by mouth daily as needed (pain). Reported on 04/26/2015    [provider]  B Complex Vitamins (VITAMIN B COMPLEX PO) Take by mouth.    [provider]  dutasteride (AVODART) 0.5 MG capsule Take 0.5 mg by mouth daily.      [provider]  fluticasone (FLONASE) 50 MCG/ACT nasal spray Place into both nostrils daily.    [provider]  ibuprofen (ADVIL) 800 MG tablet Take 1 tablet (800 mg total) by mouth every 8 (eight) hours as needed for moderate pain. 06/23/19   Ailene Ards, NP  loratadine-pseudoephedrine (CLARITIN-D 24-HOUR) 10-240 MG  per 24 hr tablet Take 1 tablet by mouth. Only as needed    [provider]  meclizine (ANTIVERT) 25 MG tablet Take 1 tablet (25 mg total) by mouth 3 (three) times daily as needed for dizziness. 07/26/19   Evalee Jefferson, PA-C  Multiple Vitamins-Minerals (MULTIVITAMIN GUMMIES ADULT PO) Take 1 capsule by mouth daily.     [provider]  Netarsudil-Latanoprost (ROCKLATAN) 0.02-0.005 % SOLN Apply 1 drop to eye 1 day or 1 dose.     [provider]  sodium chloride (OCEAN) 0.65 % nasal spray Place 1 spray into the nose as needed. Reported on 05/21/2015    [provider]  tadalafil (CIALIS) 5 MG tablet Take 1 tablet (5 mg total) by mouth daily. 06/23/19   Ailene Ards, NP  Tamsulosin HCl (FLOMAX) 0.4 MG CAPS Take 0.4 mg by mouth daily.      [provider]    Allergies    Patient has no known allergies.  Review of Systems   Review of Systems  Constitutional: Negative for chills and fever.  HENT: Positive for congestion, rhinorrhea and sinus pain.   Eyes: Negative.   Respiratory: Positive for chest tightness and shortness of breath. Negative for cough.   Cardiovascular: Negative for chest pain, palpitations and leg swelling.  Gastrointestinal: Negative for abdominal pain, nausea and vomiting.  Genitourinary: Negative.   Musculoskeletal: Negative for arthralgias, joint swelling and neck pain.  Skin: Negative.  Negative for rash and wound.  Neurological: Positive for dizziness and headaches. Negative for syncope, facial asymmetry, speech difficulty, weakness, light-headedness and numbness.  Psychiatric/Behavioral: Negative.     Physical Exam Updated Vital Signs BP (!) 153/86   Pulse 79   Temp 98.3 F (36.8 C)   Resp 20   Ht 6\' 2"  (1.88 m)   Wt 102.1 kg   SpO2 100%   BMI 28.89 kg/m   Physical Exam Vitals and nursing note reviewed.  Constitutional:      Appearance: He is well-developed.  HENT:     Head: Normocephalic and atraumatic.  Eyes:       Conjunctiva/sclera: Conjunctivae normal.     Pupils: Pupils are equal, round, and reactive to light.  Cardiovascular:     Rate and Rhythm: Normal rate and regular rhythm.     Heart sounds: Normal heart sounds.  Pulmonary:     Effort: Pulmonary effort is normal.     Breath sounds: Normal breath sounds. No wheezing.  Abdominal:     General: Bowel sounds are normal.     Palpations: Abdomen is soft.     Tenderness: There is no abdominal tenderness.  Musculoskeletal:        General: Normal range of motion.     Cervical back: Normal range of motion and neck supple.  Lymphadenopathy:     Cervical: No cervical adenopathy.  Skin:    General: Skin is warm and dry.     Findings: No rash.  Neurological:     General: No focal deficit present.     Mental Status: He is alert and oriented to person, place, and time.     GCS: GCS eye subscore is 4. GCS verbal subscore is 5. GCS motor subscore is 6.     Cranial Nerves: No cranial nerve deficit.     Sensory: No sensory deficit.     Coordination: Coordination normal.     Gait: Gait normal.     Comments: Normal heel-shin, normal rapid alternating movements. Cranial nerves III-XII intact.  No pronator drift.  Psychiatric:        Speech: Speech normal.        Behavior: Behavior normal.        Thought Content: Thought content normal.     ED Results / Procedures / Treatments   Labs (all labs ordered are listed, but only abnormal results are displayed) Labs Reviewed  CBC WITH DIFFERENTIAL/PLATELET - Abnormal; Notable for the following components:      Result Value   RBC 4.19 (*)    Hemoglobin 12.7 (*)    All other components within normal limits  TROPONIN I (HIGH SENSITIVITY) - Abnormal; Notable for  the following components:   Troponin I (High Sensitivity) 23 (*)    All other components within normal limits  TROPONIN I (HIGH SENSITIVITY) - Abnormal; Notable for the following components:   Troponin I (High Sensitivity) 23 (*)    All other  components within normal limits  BASIC METABOLIC PANEL    EKG EKG Interpretation  Date/Time:  Tuesday July 26 2019 16:30:28 EDT Ventricular Rate:  60 PR Interval:    QRS Duration: 99 QT Interval:  412 QTC Calculation: 412 R Axis:   13 Text Interpretation: Sinus rhythm Left ventricular hypertrophy since last tracing no significant change Confirmed by Noemi Chapel [phone]) on 07/26/2019 7:53:55 PM   Radiology CT Head Wo Contrast  Result Date: 07/26/2019 CLINICAL DATA:  Headache and dizziness EXAM: CT HEAD WITHOUT CONTRAST TECHNIQUE: Contiguous axial images were obtained from the base of the skull through the vertex without intravenous contrast. COMPARISON:  October 05, 2017 FINDINGS: Brain: No evidence of acute territorial infarction, hemorrhage, hydrocephalus,extra-axial collection or mass lesion/mass effect. Normal gray-white differentiation. Ventricles are normal in size and contour. Vascular: No hyperdense vessel or unexpected calcification. Skull: The skull is intact. No fracture or focal lesion identified. Sinuses/Orbits: There is a mucous retention cyst seen within the left maxillary sinus. The orbits and globes intact. Other: None IMPRESSION: No acute intracranial abnormality. Electronically Signed   By: Prudencio Pair M.D.   On: 07/26/2019 19:26   DG Chest Port 1 View  Result Date: 07/26/2019 CLINICAL DATA:  Chest pressure EXAM: PORTABLE CHEST 1 VIEW COMPARISON:  July 28, 2012 FINDINGS: There is mild cardiomegaly. Aortic knob calcifications. Mild prominence of the central pulmonary vasculature. No large airspace consolidation or pleural effusion. No acute osseous abnormality. IMPRESSION: Mild pulmonary vascular congestion. Electronically Signed   By: Prudencio Pair M.D.   On: 07/26/2019 18:15    Procedures Procedures (including critical care time)  Medications Ordered in ED Medications  meclizine (ANTIVERT) tablet 25 mg (25 mg Oral Given 07/26/19 1721)    ED Course  I have reviewed  the triage vital signs and the nursing notes.  Pertinent labs & imaging results that were available during my care of the patient were reviewed by me and considered in my medical decision making (see chart for details).    MDM Rules/Calculators/A&P HEAR Score: 4                    Pt with headache, dizziness and 20 minute episode of chest pressure and sob this am, meclizine given here with complete resolution of dizziness.  He has no cp sx here.  Labs, imaging, ekg reviewed and discussed with pt. Delta troponin negative.   No abnormal Ct imaging of head, he does have a mucoid retention cyst in the left maxillary sinus, not likely symptomatic.  He was given referral to cardiology for f/u care. Also prescribed meclizine for prn use.  No intracranial process, no cva, nonfocal neuro exam.  Suspect  dizziness from peripheral source.   Final Clinical Impression(s) / ED Diagnoses Final diagnoses:  Acute non intractable tension-type headache  Dizziness  Chest pain, unspecified type    Rx / DC Orders ED Discharge Orders         Ordered    meclizine (ANTIVERT) 25 MG tablet  3 times daily PRN     07/26/19 2010           Evalee Jefferson, Hershal Coria 07/27/19 1229    Noemi Chapel, MD 07/30/19 [phone]

## 2019-07-26 NOTE — ED Provider Notes (Addendum)
[facility]    CSN: [id number] Arrival date & time: 07/26/19  1440      History   Chief Complaint Chief Complaint  Patient presents with  . Headache    HPI William Joseph is a 74 y.o. male.   Who present to the urgent care with a complaint of severe headache, dizziness, chest pressure and congestion for the past 1 to 2 days.  Localized his pain to his generalized head.  Describes the pain as constant, and throbbing in character.  Has tried [facility] powder without relief.  Reports symptom has improved when he left his house. Denies similar symptoms in the past.  Describes the headache as the worst headache of his life.  Denies fever, chills, nausea, vomiting, aura, rhinorrhea, watery eyes, chest pain, SOB, abdominal pain, weakness, numbness or tingling.    The history is provided by the patient. The history is limited by a language barrier. No language interpreter was used.  Headache Associated symptoms: congestion and dizziness     Past Medical History:  Diagnosis Date  . Anxiety   . Essential hypertension, benign   . History of cardiac catheterization    Normal coronaries March 2011  . History of non-Hodgkin's lymphoma 06/26/2009   Qualifier: Diagnosis of  By: Dwaine Gale, CNA, [place]    . Non Hodgkin's lymphoma ([place])   . Nonerosive nonspecific gastritis    EGD - Dr. Laural Golden  . Obsessive-compulsive disorder   . Palpitations   . Sleep apnea     Patient Active Problem List   Diagnosis Date Noted  . BPH (benign prostatic hyperplasia) 06/23/2019  . History of colonic polyps   . Weakness of both legs 11/22/2013  . Unstable balance 11/22/2013  . Dysthymia 06/17/2012  . Shortness of breath 12/17/2011  . Palpitations 12/17/2011  . History of non-Hodgkin's lymphoma 06/26/2009  . ANXIETY 06/26/2009  . Essential hypertension, benign 06/26/2009  . SLEEP APNEA 06/26/2009    Past Surgical History:  Procedure Laterality Date  . COLONOSCOPY N/A 06/29/2015   Procedure:  COLONOSCOPY;  Surgeon: Daneil Dolin, MD;  [facility];  Service: Endoscopy;  Laterality: N/A;  9:30 Am - moved to 9:15 - office to notify  . ESOPHAGOGASTRODUODENOSCOPY ENDOSCOPY N/A   . EXPLORATORY LAPAROTOMY    . GLAUCOMA SURGERY Bilateral 11/2013   didnt work  . LYMPH NODE BIOPSY    . POLYPECTOMY  06/29/2015   Procedure: POLYPECTOMY;  Surgeon: Daneil Dolin, MD;  Location[facility];  Service: Endoscopy;;  descending colon polyp removed via cold snare  . Port-a-cath placement    . Tooth implant         Home Medications    Prior to Admission medications   Medication Sig Start Date End Date Taking? Authorizing Provider  Aspirin-Caffeine (BC FAST PAIN RELIEF PO) Take 1 packet by mouth daily as needed (pain). Reported on 04/26/2015    [provider]  B Complex Vitamins (VITAMIN B COMPLEX PO) Take by mouth.    [provider]  dutasteride (AVODART) 0.5 MG capsule Take 0.5 mg by mouth daily.      [provider]  fluticasone (FLONASE) 50 MCG/ACT nasal spray Place into both nostrils daily.    [provider]  ibuprofen (ADVIL) 800 MG tablet Take 1 tablet (800 mg total) by mouth every 8 (eight) hours as needed for moderate pain. 06/23/19   Ailene Ards, NP  loratadine-pseudoephedrine (CLARITIN-D 24-HOUR) 10-240 MG per 24 hr tablet Take 1 tablet  by mouth. Only as needed    [provider]  Multiple Vitamins-Minerals (MULTIVITAMIN GUMMIES ADULT PO) Take 1 capsule by mouth daily.     [provider]  Netarsudil-Latanoprost (ROCKLATAN) 0.02-0.005 % SOLN Apply to eye 1 day or 1 dose.    [provider]  sodium chloride (OCEAN) 0.65 % nasal spray Place 1 spray into the nose as needed. Reported on 05/21/2015    [provider]  tadalafil (CIALIS) 5 MG tablet Take 1 tablet (5 mg total) by mouth daily. 06/23/19   Ailene Ards, NP  Tamsulosin HCl (FLOMAX) 0.4 MG CAPS Take 0.4 mg by mouth daily.      [provider]    Family History Family History  Problem Relation Age of Onset  . Diabetes Father   . Coronary artery disease Mother   . Hypertension Brother   . Mental illness Maternal Uncle   . Dementia Maternal Grandfather   . Seizures Cousin   . Drug abuse Other   . Seizures Maternal Uncle   . ADD / ADHD Neg Hx   . Alcohol abuse Neg Hx   . Anxiety disorder Neg Hx   . Bipolar disorder Neg Hx   . Depression Neg Hx   . OCD Neg Hx   . Paranoid behavior Neg Hx   . Schizophrenia Neg Hx   . Sexual abuse Neg Hx   . Physical abuse Neg Hx     Social History Social History   Tobacco Use  . Smoking status: Light Tobacco Smoker    Packs/day: 1.00    Years: 1.00    Pack years: 1.00    Types: Cigars    Start date: 09/09/1964  . Smokeless tobacco: Never Used  . Tobacco comment: Puffs on cigars every so often.   Substance Use Topics  . Alcohol use: Yes    Alcohol/week: 0.0 standard drinks    Comment: glass of wine occasionally  . Drug use: No     Allergies   Patient has no known allergies.   Review of Systems Review of Systems  Constitutional: Negative.   HENT: Positive for congestion.   Respiratory: Negative.   Cardiovascular: Positive for chest pain.  Neurological: Positive for dizziness and headaches.  All other systems reviewed and are negative.    Physical Exam Triage Vital Signs ED Triage Vitals [07/26/19 1453]  Enc Vitals Group     BP (!) 176/87     Pulse Rate 86     Resp 18     Temp 98.6 F (37 C)     Temp Source Oral     SpO2 96 %     Weight      Height      Head Circumference      Peak Flow      Pain Score 8     Pain Loc      Pain Edu?      Excl. in [place]?    No data found.  Updated Vital Signs BP (!) 176/87 (BP Location: Right Arm)   Pulse 86   Temp 98.6 F (37 C) (Oral)   Resp 18   SpO2 96%   Visual Acuity Right Eye Distance:   Left Eye Distance:   Bilateral Distance:    Right Eye Near:   Left Eye Near:    Bilateral Near:       Physical Exam Vitals and nursing note reviewed.  Constitutional:      General: He is  not in acute distress.    Appearance: Normal appearance. He is well-developed and normal weight. He is not ill-appearing, toxic-appearing or diaphoretic.  HENT:     Head: Normocephalic.     Right Ear: Tympanic membrane, ear canal and external ear normal. There is no impacted cerumen.     Left Ear: Tympanic membrane, ear canal and external ear normal. There is no impacted cerumen.  Cardiovascular:     Rate and Rhythm: Normal rate and regular rhythm.     Pulses: Normal pulses.     Heart sounds: Normal heart sounds. No murmur. No friction rub. No gallop.   Pulmonary:     Effort: Pulmonary effort is normal. No respiratory distress.     Breath sounds: Normal breath sounds. No stridor. No wheezing, rhonchi or rales.  Chest:     Chest wall: No tenderness.  Neurological:     General: No focal deficit present.     Mental Status: He is alert and oriented to person, place, and time.     Cranial Nerves: Cranial nerves are intact. No cranial nerve deficit.     Sensory: Sensation is intact. No sensory deficit.     Motor: Motor function is intact. No weakness.     Coordination: Coordination is intact. Coordination normal.     Gait: Gait is intact. Gait normal.      UC Treatments / Results  Labs (all labs ordered are listed, but only abnormal results are displayed) Labs Reviewed - No data to display  EKG   Radiology No results found.  Procedures Procedures (including critical care time)  Medications Ordered in UC Medications - No data to display  Initial Impression / Assessment and Plan / UC Course  I have reviewed the triage vital signs and the nursing notes.  Pertinent labs & imaging results that were available during my care of the patient were reviewed by me and considered in my medical decision making (see chart for details).   Patient is stable for discharge.  EKG was completed and it  showed sinus rhythm with occasional PVC and nonspecific ST and T wave abnormality. Neuro assessment was otherwise normal.  Was advised to go to ED for further evaluation. He declined to go by ambulance.  Final Clinical Impressions(s) / UC Diagnoses   Final diagnoses:  Worst headache of life  Dizziness  Chest pressure     Discharge Instructions     Patient was discharged to go to ED for further evaluation    ED Prescriptions    None     PDMP not reviewed this encounter.   Emerson Monte, FNP 07/26/19 Vernelle Emerald    Emerson Monte, FNP 07/26/19 1826

## 2019-07-26 NOTE — ED Triage Notes (Signed)
Pt sts HA today associated with nasal congestion; pt sts HA in severe in nature; pt noted hypertensive and denies hx of same

## 2019-08-04 ENCOUNTER — Other Ambulatory Visit: Payer: Self-pay

## 2019-08-04 ENCOUNTER — Ambulatory Visit (INDEPENDENT_AMBULATORY_CARE_PROVIDER_SITE_OTHER): Payer: Federal, State, Local not specified - PPO | Admitting: Nurse Practitioner

## 2019-08-04 ENCOUNTER — Encounter (INDEPENDENT_AMBULATORY_CARE_PROVIDER_SITE_OTHER): Payer: Self-pay | Admitting: Nurse Practitioner

## 2019-08-04 VITALS — BP 140/90 | HR 80 | Temp 98.7°F | Resp 15 | Ht 74.0 in | Wt 222.8 lb

## 2019-08-04 DIAGNOSIS — E785 Hyperlipidemia, unspecified: Secondary | ICD-10-CM | POA: Diagnosis not present

## 2019-08-04 DIAGNOSIS — R7303 Prediabetes: Secondary | ICD-10-CM | POA: Diagnosis not present

## 2019-08-04 DIAGNOSIS — D649 Anemia, unspecified: Secondary | ICD-10-CM | POA: Insufficient documentation

## 2019-08-04 DIAGNOSIS — I10 Essential (primary) hypertension: Secondary | ICD-10-CM | POA: Diagnosis not present

## 2019-08-04 DIAGNOSIS — M25572 Pain in left ankle and joints of left foot: Secondary | ICD-10-CM

## 2019-08-04 LAB — CBC
HCT: 40.3 % (ref 38.5–50.0)
Hemoglobin: 13.2 g/dL (ref 13.2–17.1)
MCH: 30 pg (ref 27.0–33.0)
MCHC: 32.8 g/dL (ref 32.0–36.0)
MCV: 91.6 fL (ref 80.0–100.0)
MPV: 11.6 fL (ref 7.5–12.5)
Platelets: 186 10*3/uL (ref 140–400)
RBC: 4.4 10*6/uL (ref 4.20–5.80)
RDW: 13.3 % (ref 11.0–15.0)
WBC: 5 10*3/uL (ref 3.8–10.8)

## 2019-08-04 LAB — IRON, TOTAL/TOTAL IRON BINDING CAP
%SAT: 26 % (calc) (ref 20–48)
Iron: 87 ug/dL (ref 50–180)
TIBC: 329 mcg/dL (calc) (ref 250–425)

## 2019-08-04 LAB — FERRITIN: Ferritin: 66 ng/mL (ref 24–380)

## 2019-08-04 NOTE — Progress Notes (Signed)
Subjective:  Patient ID: William Joseph, male    DOB: [birth year]  Age: 74 y.o. MRN: [id number]  CC:  Chief Complaint  Patient presents with  . Follow-up    Prediabetes, ankle pain, anemia  . Hyperlipidemia  . Hypertension      HPI  This patient comes in today for the above.  Prediabetes: Last office visit he was established as a new patient to this practice.  A1c was 5.9 which does make him prediabetic.  Ankle pain: He was complaining of ankle pain at his last office visit.  X-ray was conducted which showed no fracture.  He was encouraged to use over-the-counter pain medication and rest his ankle.  Today he tells me the pain has resolved.  Hyperlipidemia: As stated above he was established to this practice as last office visit and cholesterol panel was collected at that time, I do not believe that she was fasting at that time.  It showed: Lipid Panel (ASCVD risk score approximately 26.6%)        Component                Value               Date/Time                 CHOL                     227 (H)             06/23/2019 1352           TRIG                     111                 06/23/2019 1352           HDL                      42                  06/23/2019 1352           CHOLHDL                  5.4 (H)             06/23/2019 1352           LDLCALC                  162 (H)             06/23/2019 1352         Hypertension: He was noted to have elevated blood pressure at last office visit.  I do see that he went to the emergency department approximately 10 days ago due to dizziness and headache.  At that time his blood pressure was quite elevated.  He did tell me he took a decongestant prior to this episode.  He is concerned because he tells me he has never had elevated blood pressure before in his life.  Anemia: Blood work at his last office visit did show that he had mild anemia.  Repeat blood work in the hospital did show his anemia was stable.  He tells me he has a  history of stomach cancer, but I only see a history of lymphoma noted in his medical record.   Past Medical History:  Diagnosis Date  .  Anxiety   . Essential hypertension, benign   . History of cardiac catheterization    Normal coronaries March 2011  . History of non-Hodgkin's lymphoma 06/26/2009   Qualifier: Diagnosis of  By: Dwaine Gale, CNA, [place]    . Non Hodgkin's lymphoma ([place])   . Nonerosive nonspecific gastritis    EGD - Dr. Laural Golden  . Obsessive-compulsive disorder   . Palpitations   . Sleep apnea       Family History  Problem Relation Age of Onset  . Diabetes Father   . Coronary artery disease Mother   . Hypertension Brother   . Mental illness Maternal Uncle   . Dementia Maternal Grandfather   . Seizures Cousin   . Drug abuse Other   . Seizures Maternal Uncle   . ADD / ADHD Neg Hx   . Alcohol abuse Neg Hx   . Anxiety disorder Neg Hx   . Bipolar disorder Neg Hx   . Depression Neg Hx   . OCD Neg Hx   . Paranoid behavior Neg Hx   . Schizophrenia Neg Hx   . Sexual abuse Neg Hx   . Physical abuse Neg Hx     Social History   Social History Narrative  . Not on file   Social History   Tobacco Use  . Smoking status: Light Tobacco Smoker    Packs/day: 1.00    Years: 1.00    Pack years: 1.00    Types: Cigars    Start date: 09/09/1964  . Smokeless tobacco: Never Used  . Tobacco comment: Puffs on cigars every so often.   Substance Use Topics  . Alcohol use: Yes    Alcohol/week: 0.0 standard drinks    Comment: glass of wine occasionally     Current Meds  Medication Sig  . Aspirin-Caffeine (BC FAST PAIN RELIEF PO) Take 1 packet by mouth daily as needed (pain). Reported on 04/26/2015  . B Complex Vitamins (VITAMIN B COMPLEX PO) Take by mouth.  . dutasteride (AVODART) 0.5 MG capsule Take 0.5 mg by mouth daily.    . fluticasone (FLONASE) 50 MCG/ACT nasal spray Place into both nostrils daily.  Marland Kitchen ibuprofen (ADVIL) 800 MG tablet Take 1 tablet (800 mg total) by mouth  every 8 (eight) hours as needed for moderate pain.  Marland Kitchen loratadine-pseudoephedrine (CLARITIN-D 24-HOUR) 10-240 MG per 24 hr tablet Take 1 tablet by mouth. Only as needed  . meclizine (ANTIVERT) 25 MG tablet Take 1 tablet (25 mg total) by mouth 3 (three) times daily as needed for dizziness.  . Multiple Vitamins-Minerals (MULTIVITAMIN GUMMIES ADULT PO) Take 1 capsule by mouth daily.   . Netarsudil-Latanoprost (ROCKLATAN) 0.02-0.005 % SOLN Apply 1 drop to eye 1 day or 1 dose.   . sodium chloride (OCEAN) 0.65 % nasal spray Place 1 spray into the nose as needed. Reported on 05/21/2015  . tadalafil (CIALIS) 5 MG tablet Take 1 tablet (5 mg total) by mouth daily.  . Tamsulosin HCl (FLOMAX) 0.4 MG CAPS Take 0.4 mg by mouth daily.      ROS:  Negative   Objective:   Today's Vitals: BP 140/90   Pulse 80   Temp 98.7 F (37.1 C) (Temporal)   Resp 15   Ht 6\' 2"  (1.88 m)   Wt 222 lb 12.8 oz (101.1 kg)   SpO2 98%   BMI 28.61 kg/m  Vitals with BMI 08/04/2019 07/26/2019 07/26/2019  Height 6\' 2"  - 6\' 2"   Weight 222 lbs 13 oz - 225 lbs  BMI [id number]  Systolic [id number] -  Diastolic 90 91 -  Pulse 80 63 -  Some encounter information is confidential and restricted. Go to Review Flowsheets activity to see all data.     Physical Exam He appears systemically well today.  No acute distress noted.      Assessment and Plan   1. Anemia, unspecified type   2. Essential hypertension, benign   3. Hyperlipidemia, unspecified hyperlipidemia type   4. Prediabetes   5. Acute left ankle pain      Plan: 1.  I will collect repeat blood work including CBC and check his iron levels.  I will also send him with stool card so we can see if he has any blood in stool.  Further recommendations will be made based upon his above.  2.-4.  We discussed his hypertension, hyperlipidemia, and prediabetes.  I also discussed his ASCVD risk score and what this score represents.  At this time he would like to start by  focusing on lifestyle and behavioral changes as opposed to starting medication for any of these conditions.  We spoke extensively about dietary recommendations as well as intermittent fasting.  He has been trying to follow a whole food, mostly plant-based diet.  He is also interested in trying intermittent fasting.  I recommended he try a 16-hour fast 3 days a week.  He tells me he will do this.  I encouraged him to remain hydrated while fasting, and to let me know if he has any questions.  5.  Resolved, no further work-up at this time.   Tests ordered Orders Placed This Encounter  Procedures  . CBC  . Iron and TIBC  . Ferritin  . Vitamin B12  . Fecal Globin By Immunochemistry      No orders of the defined types were placed in this encounter.   Patient to follow-up in 6 to 8 weeks to see Dr. Anastasio Champion and consider collecting repeat fasting lipid panel. I spent 40 minutes dedicated to the care of this patient on the date of this encounter which includes a combination of either face-to-face or virtual contact with the patient, review of records , and ordering of tests and/or procedures.   Ailene Ards, NP

## 2019-08-04 NOTE — Patient Instructions (Signed)
[facility] Dietary Recommendations for Weight Loss What to Avoid . Avoid added sugars o Often added sugar can be found in processed foods such as many condiments, dry cereals, cakes, cookies, chips, crisps, crackers, candies, sweetened drinks, etc.  o Read labels and AVOID/DECREASE use of foods with the following in their ingredient list: Sugar, fructose, high fructose corn syrup, sucrose, glucose, maltose, dextrose, molasses, cane sugar, brown sugar, any type of syrup, agave nectar, etc.   . Avoid snacking in between meals . Avoid foods made with flour o If you are going to eat food made with flour, choose those made with whole-grains; and, minimize your consumption as much as is tolerable . Avoid processed foods o These foods are generally stocked in the middle of the grocery store. Focus on shopping on the perimeter of the grocery.  . Avoid Meat  o We recommend following a plant-based diet at [facility]. Thus, we recommend avoiding meat as a general rule. Consider eating beans, legumes, eggs, and/or dairy products for regular protein sources o If you plan on eating meat limit to 4 ounces of meat at a time and choose lean options such as Fish, chicken, turkey. Avoid red meat intake such as pork and/or steak What to Include . Vegetables o GREEN LEAFY VEGETABLES: Kale, spinach, mustard greens, collard greens, cabbage, broccoli, etc. o OTHER: Asparagus, cauliflower, eggplant, carrots, peas, Brussel sprouts, tomatoes, bell peppers, zucchini, beets, cucumbers, etc. . Grains, seeds, and legumes o Beans: kidney beans, black eyed peas, garbanzo beans, black beans, pinto beans, etc. o Whole, unrefined grains: brown rice, barley, bulgur, oatmeal, etc. . Healthy fats  o Avoid highly processed fats such as vegetable oil o Examples of healthy fats: avocado, olives, virgin olive oil, dark chocolate (?72% Cocoa), nuts (peanuts, almonds, walnuts, cashews, pecans, etc.) . None to Low  Intake of Animal Sources of Protein o Meat sources: chicken, turkey, salmon, tuna. Limit to 4 ounces of meat at one time. o Consider limiting dairy sources, but when choosing dairy focus on: PLAIN Greek yogurt, cottage cheese, high-protein milk . Fruit o Choose berries  When to Eat . Intermittent Fasting: o Choosing not to eat for a specific time period, but DO FOCUS ON HYDRATION when fasting o Multiple Techniques: - Time Restricted Eating: eat 3 meals in a day, each meal lasting no more than 60 minutes, no snacks between meals - 16-18 hour fast: fast for 16 to 18 hours up to 7 days a week. Often suggested to start with 2-3 nonconsecutive days per week.  . Remember the time you sleep is counted as fasting.  . Examples of eating schedule: Fast from 7:00pm-11:00am. Eat between 11:00am-7:00pm.  - 24-hour fast: fast for 24 hours up to every other day. Often suggested to start with 1 day per week . Remember the time you sleep is counted as fasting . Examples of eating schedule:  o Eating day: eat 2-3 meals on your eating day. If doing 2 meals, each meal should last no more than 90 minutes. If doing 3 meals, each meal should last no more than 60 minutes. Finish last meal by 7:00pm. o Fasting day: Fast until 7:00pm.  o IF YOU FEEL UNWELL FOR ANY REASON/IN ANY WAY WHEN FASTING, STOP FASTING BY EATING A NUTRITIOUS SNACK OR LIGHT MEAL o ALWAYS FOCUS ON HYDRATION DURING FASTS - Acceptable Hydration sources: water, broths, tea/coffee (black tea/coffee is best but using a small amount of whole-fat dairy products in coffee/tea is acceptable).  -   Poor Hydration Sources: anything with sugar or artificial sweeteners added to it  These recommendations have been developed for patients that are actively receiving medical care from either Dr. [facility] or Yamira Papa, DNP, NP-C at [facility]. These recommendations are developed for patients with specific medical conditions and are not meant to be  distributed or used by others that are not actively receiving care from either provider listed above at [facility]. It is not appropriate to participate in the above eating plans without proper medical supervision.   Reference: Fung, J. The obesity code. [place]: [place]; 2016.   

## 2019-08-29 ENCOUNTER — Ambulatory Visit: Payer: Federal, State, Local not specified - PPO | Admitting: Cardiovascular Disease

## 2019-09-15 ENCOUNTER — Ambulatory Visit (INDEPENDENT_AMBULATORY_CARE_PROVIDER_SITE_OTHER): Payer: Federal, State, Local not specified - PPO | Admitting: Internal Medicine

## 2019-09-22 ENCOUNTER — Other Ambulatory Visit (INDEPENDENT_AMBULATORY_CARE_PROVIDER_SITE_OTHER): Payer: Self-pay | Admitting: Nurse Practitioner

## 2019-09-22 DIAGNOSIS — R3914 Feeling of incomplete bladder emptying: Secondary | ICD-10-CM

## 2019-09-22 MED ORDER — TADALAFIL 5 MG PO TABS: 5.0000 mg | ORAL_TABLET | Freq: Every day | ORAL | 0 refills | Status: DC

## 2019-09-23 NOTE — Progress Notes (Deleted)
CARDIOLOGY CONSULT NOTE       Patient ID: Kienan Doublin MRN: [id number] DOB/AGE: 1945/07/29 74 y.o.  Admit date: (Not on file) Referring Physician: [facility] Primary Physician: Doree Albee, MD Primary Cardiologist: New Reason for Consultation: Chest pain  Active Problems:   * No active hospital problems. *   HPI:  74 y.o. followed by Dr Anastasio Champion. She has HTN, HLD and pre diabetes. His last office visit noted no medication patient wanted to pursue life style changes and behavioral changes as opposed to meds. A1c 5.9 LDL 162 but not fasting Seen in [facility] ED 07/27/19 with tension headache, dizziness and chest pain. Chest pain transient and resolved by time she came to ER Pain lasted about 20 minutes Center of chest no radiation  Some dyspnea when walking in the heat She felt her eyes had to catch up with her head when turned to side. 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 Quit last 3 months CT showed emphysema   ***  ROS All other systems reviewed and negative except as noted above  Past Medical History:  Diagnosis Date  . Anxiety   . Essential hypertension, benign   . History of cardiac catheterization    Normal coronaries March 2011  . History of non-Hodgkin's lymphoma 06/26/2009   Qualifier: Diagnosis of  By: Dwaine Gale, CNA, [place]    . Non Hodgkin's lymphoma ([place])   . Nonerosive nonspecific gastritis    EGD -  Dr. Laural Golden  . Obsessive-compulsive disorder   . Palpitations   . Sleep apnea     Family History  Problem Relation Age of Onset  . Diabetes Father   . Coronary artery disease Mother   . Hypertension Brother   . Mental illness Maternal Uncle   . Dementia Maternal Grandfather   . Seizures Cousin   . Drug abuse Other   . Seizures Maternal Uncle   . ADD / ADHD Neg Hx   . Alcohol abuse Neg Hx   . Anxiety disorder Neg Hx   . Bipolar disorder Neg Hx   . Depression Neg Hx   . OCD Neg Hx   . Paranoid behavior Neg Hx   . Schizophrenia Neg Hx   . Sexual abuse Neg Hx   . Physical abuse Neg Hx     Social History   Socioeconomic History  . Marital status: Married    Spouse name: Not on file  . Number of children: Not on file  . Years of education: Not on file  . Highest education level: Not on file  Occupational History  . Not on file  Tobacco Use  . Smoking status: Light Tobacco Smoker    Packs/day: 1.00    Years: 1.00    Pack years: 1.00    Types: Cigars    Start date: 09/09/1964  . Smokeless tobacco: Never Used  .  Tobacco comment: Puffs on cigars every so often.   Substance and Sexual Activity  . Alcohol use: Yes    Alcohol/week: 0.0 standard drinks    Comment: glass of wine occasionally  . Drug use: No  . Sexual activity: Not on file  Other Topics Concern  . Not on file  Social History Narrative  . Not on file   Social Determinants of Health   Financial Resource Strain:   . Difficulty of Paying Living Expenses:   Food Insecurity:   . Worried About Charity fundraiser in the Last Year:   . Arboriculturist in the Last Year:   Transportation Needs:   . Film/video editor (Medical):   Marland Kitchen Lack of Transportation (Non-Medical):   Physical Activity:   . Days of Exercise per Week:   . Minutes of Exercise per Session:   Stress:   . Feeling of Stress :   Social Connections:   . Frequency of Communication with Friends and Family:   . Frequency of Social Gatherings  with Friends and Family:   . Attends Religious Services:   . Active Member of Clubs or Organizations:   . Attends Archivist Meetings:   Marland Kitchen Marital Status:   Intimate Partner Violence:   . Fear of Current or Ex-Partner:   . Emotionally Abused:   Marland Kitchen Physically Abused:   . Sexually Abused:     Past Surgical History:  Procedure Laterality Date  . COLONOSCOPY N/A 06/29/2015   Procedure: COLONOSCOPY;  Surgeon: Daneil Dolin, MD;  Location: [facility];  Service: Endoscopy;  Laterality: N/A;  9:30 Am - moved to 9:15 - office to notify  . ESOPHAGOGASTRODUODENOSCOPY ENDOSCOPY N/A   . EXPLORATORY LAPAROTOMY    . GLAUCOMA SURGERY Bilateral 11/2013   didnt work  . LYMPH NODE BIOPSY    . POLYPECTOMY  06/29/2015   Procedure: POLYPECTOMY;  Surgeon: Daneil Dolin, MD;  Location: [facility];  Service: Endoscopy;;  descending colon polyp removed via cold snare  . Port-a-cath placement    . Tooth implant        Current Outpatient Medications:  .  Aspirin-Caffeine (BC FAST PAIN RELIEF PO), Take 1 packet by mouth daily as needed (pain). Reported on 04/26/2015, Disp: , Rfl:  .  B Complex Vitamins (VITAMIN B COMPLEX PO), Take by mouth., Disp: , Rfl:  .  dutasteride (AVODART) 0.5 MG capsule, Take 0.5 mg by mouth daily.  , Disp: , Rfl:  .  fluticasone (FLONASE) 50 MCG/ACT nasal spray, Place into both nostrils daily., Disp: , Rfl:  .  ibuprofen (ADVIL) 800 MG tablet, Take 1 tablet (800 mg total) by mouth every 8 (eight) hours as needed for moderate pain., Disp: 24 tablet, Rfl: 0 .  loratadine-pseudoephedrine (CLARITIN-D 24-HOUR) 10-240 MG per 24 hr tablet, Take 1 tablet by mouth. Only as needed, Disp: , Rfl:  .  meclizine (ANTIVERT) 25 MG tablet, Take 1 tablet (25 mg total) by mouth 3 (three) times daily as needed for dizziness., Disp: 30 tablet, Rfl: 0 .  Multiple Vitamins-Minerals (MULTIVITAMIN GUMMIES ADULT PO), Take 1 capsule by mouth daily. , Disp: , Rfl:  .  Netarsudil-Latanoprost  (ROCKLATAN) 0.02-0.005 % SOLN, Apply 1 drop to eye 1 day or 1 dose. , Disp: , Rfl:  .  sodium chloride (OCEAN) 0.65 % nasal spray, Place 1 spray into the nose as needed. Reported on 05/21/2015, Disp: , Rfl:  .  tadalafil (CIALIS) 5 MG tablet, Take  1 tablet (5 mg total) by mouth daily., Disp: 90 tablet, Rfl: 0 .  Tamsulosin HCl (FLOMAX) 0.4 MG CAPS, Take 0.4 mg by mouth daily.  , Disp: , Rfl:     Physical Exam: There were no vitals taken for this visit.   Affect appropriate Healthy:  appears stated age 55: normal Neck supple with no adenopathy JVP normal no bruits no thyromegaly Lungs clear with no wheezing and good diaphragmatic motion Heart:  S1/S2 no murmur, no rub, gallop or click PMI normal Abdomen: benighn, BS positve, no tenderness, no AAA no bruit.  No HSM or HJR Distal pulses intact with no bruits No edema Neuro non-focal Skin warm and dry No muscular weakness  Labs:   Lab Results  Component Value Date   WBC 5.0 08/04/2019   HGB 13.2 08/04/2019   HCT 40.3 08/04/2019   MCV 91.6 08/04/2019   PLT 186 08/04/2019   No results for input(s): NA, K, CL, CO2, BUN, CREATININE, CALCIUM, PROT, BILITOT, ALKPHOS, ALT, AST, GLUCOSE in the last 168 hours.  Invalid input(s): LABALBU Lab Results  Component Value Date   CKTOTAL 156 06/14/2009   CKMB 1.6 06/14/2009   TROPONINI <0.30 07/28/2012    Lab Results  Component Value Date   CHOL 227 (H) 06/23/2019   CHOL 201 (A) 05/18/2017   Lab Results  Component Value Date   HDL 42 06/23/2019   HDL 46 05/18/2017   Lab Results  Component Value Date   LDLCALC 162 (H) 06/23/2019   LDLCALC 138 05/18/2017   Lab Results  Component Value Date   TRIG 111 06/23/2019   TRIG 76 05/18/2017   Lab Results  Component Value Date   CHOLHDL 5.4 (H) 06/23/2019   No results found for: LDLDIRECT    Radiology: No results found.  EKG: SR rate 79 PVC nonspecific ST changes 07/29/19    ASSESSMENT AND PLAN:   1. Chest Pain: atypical  r/o no acute ECG changes normal cath in 2011 and normal myovue 2017 *** 2. Dyspnea: smoker with emphysema on CT recent CXR ? Pulmonary vascular congestion Check BNP and echo consider primary referral to pulmonary for PFTls ? Occupational exposures  3. HTN:  *** 4. HLD:  *** 5. DM:  Discussed low carb diet.  Target hemoglobin A1c is 6.5 or less.  Continue current medications.  ***   Signed: Jenkins Rouge 09/23/2019, 12:18 PM

## 2019-09-26 ENCOUNTER — Other Ambulatory Visit (INDEPENDENT_AMBULATORY_CARE_PROVIDER_SITE_OTHER): Payer: Self-pay | Admitting: Internal Medicine

## 2019-09-26 MED ORDER — TAMSULOSIN HCL 0.4 MG PO CAPS: 0.4000 mg | ORAL_CAPSULE | Freq: Every day | ORAL | 3 refills | Status: DC

## 2019-09-26 NOTE — Telephone Encounter (Signed)
Patient wife is calling and requesting a refill for the pt.  Tamsulosin HCl (FLOMAX) 0.4 MG CAPS    [id number] - [place],  - [address] AT [address] [place] Phone:  [phone]  Fax:  [phone]

## 2019-09-27 ENCOUNTER — Ambulatory Visit: Payer: Federal, State, Local not specified - PPO | Admitting: Cardiovascular Disease

## 2019-10-26 ENCOUNTER — Other Ambulatory Visit: Payer: Self-pay

## 2019-10-27 ENCOUNTER — Encounter (INDEPENDENT_AMBULATORY_CARE_PROVIDER_SITE_OTHER): Payer: Self-pay | Admitting: Internal Medicine

## 2019-10-27 ENCOUNTER — Telehealth (INDEPENDENT_AMBULATORY_CARE_PROVIDER_SITE_OTHER): Payer: Federal, State, Local not specified - PPO | Admitting: Internal Medicine

## 2019-10-27 VITALS — Ht 74.0 in | Wt 222.0 lb

## 2019-10-27 DIAGNOSIS — J0100 Acute maxillary sinusitis, unspecified: Secondary | ICD-10-CM | POA: Diagnosis not present

## 2019-10-27 MED ORDER — PREDNISONE 20 MG PO TABS: 40.0000 mg | ORAL_TABLET | Freq: Every day | ORAL | 1 refills | Status: DC

## 2019-10-27 MED ORDER — AMOXICILLIN-POT CLAVULANATE 875-125 MG PO TABS: 1.0000 | ORAL_TABLET | Freq: Two times a day (BID) | ORAL | 0 refills | Status: DC

## 2019-10-27 NOTE — Progress Notes (Signed)
Metrics: Intervention Frequency ACO  Documented Smoking Status Yearly  Screened one or more times in 24 months  Cessation Counseling or  Active cessation medication Past 24 months  Past 24 months   Guideline developer: UpToDate (See UpToDate for funding source) Date Released: 2014       Wellness Office Visit  Subjective:  Patient ID: William Joseph, male    DOB: April 09, 1945  Age: 74 y.o. MRN: [id number]  CC: This is an audio telemedicine visit with the permission of the patient who is at home and I am in my office.  I used 2 identifiers to identify the patient. His chief complaint is facial and nasal congestion, headache. HPI The symptoms started yesterday and he felt somewhat feverish yesterday but today he does not feel feverish.  He does not have any body aches.  He feels he may be slightly short of breath but not convincingly so.  The main symptom he is concerned about is a headache and nasal and facial congestion. He has been fully vaccinated with COVID-19 vaccine more than 2 weeks ago.  Past Medical History:  Diagnosis Date  . Anxiety   . Essential hypertension, benign   . History of cardiac catheterization    Normal coronaries March 2011  . History of non-Hodgkin's lymphoma 06/26/2009   Qualifier: Diagnosis of  By: Dwaine Gale, CNA, [place]    . Non Hodgkin's lymphoma ([place])   . Nonerosive nonspecific gastritis    EGD - Dr. Laural Golden  . Obsessive-compulsive disorder   . Palpitations   . Sleep apnea    Past Surgical History:  Procedure Laterality Date  . COLONOSCOPY N/A 06/29/2015   Procedure: COLONOSCOPY;  Surgeon: Daneil Dolin, MD;  [facility];  Service: Endoscopy;  Laterality: N/A;  9:30 Am - moved to 9:15 - office to notify  . ESOPHAGOGASTRODUODENOSCOPY ENDOSCOPY N/A   . EXPLORATORY LAPAROTOMY    . GLAUCOMA SURGERY Bilateral 11/2013   didnt work  . LYMPH NODE BIOPSY    . POLYPECTOMY  06/29/2015   Procedure: POLYPECTOMY;  Surgeon: Daneil Dolin, MD;   [facility];  Service: Endoscopy;;  descending colon polyp removed via cold snare  . Port-a-cath placement    . Tooth implant       Family History  Problem Relation Age of Onset  . Diabetes Father   . Coronary artery disease Mother   . Hypertension Brother   . Mental illness Maternal Uncle   . Dementia Maternal Grandfather   . Seizures Cousin   . Drug abuse Other   . Seizures Maternal Uncle   . ADD / ADHD Neg Hx   . Alcohol abuse Neg Hx   . Anxiety disorder Neg Hx   . Bipolar disorder Neg Hx   . Depression Neg Hx   . OCD Neg Hx   . Paranoid behavior Neg Hx   . Schizophrenia Neg Hx   . Sexual abuse Neg Hx   . Physical abuse Neg Hx     Social History   Social History Narrative  . Not on file   Social History   Tobacco Use  . Smoking status: Light Tobacco Smoker    Packs/day: 1.00    Years: 1.00    Pack years: 1.00    Types: Cigars    Start date: 09/09/1964  . Smokeless tobacco: Never Used  . Tobacco comment: Puffs on cigars every so often.   Substance Use Topics  . Alcohol use: Yes    Alcohol/week:  0.0 standard drinks    Comment: glass of wine occasionally    Current Meds  Medication Sig  . Aspirin-Caffeine (BC FAST PAIN RELIEF PO) Take 1 packet by mouth daily as needed (pain). Reported on 04/26/2015  . B Complex Vitamins (VITAMIN B COMPLEX PO) Take by mouth.  . dutasteride (AVODART) 0.5 MG capsule Take 0.5 mg by mouth daily.    . fluticasone (FLONASE) 50 MCG/ACT nasal spray Place into both nostrils daily.  Marland Kitchen ibuprofen (ADVIL) 800 MG tablet Take 1 tablet (800 mg total) by mouth every 8 (eight) hours as needed for moderate pain.  Marland Kitchen loratadine-pseudoephedrine (CLARITIN-D 24-HOUR) 10-240 MG per 24 hr tablet Take 1 tablet by mouth. Only as needed  . meclizine (ANTIVERT) 25 MG tablet Take 1 tablet (25 mg total) by mouth 3 (three) times daily as needed for dizziness.  . Multiple Vitamins-Minerals (MULTIVITAMIN GUMMIES ADULT PO) Take 1 capsule by mouth  daily.   . Netarsudil-Latanoprost (ROCKLATAN) 0.02-0.005 % SOLN Apply 1 drop to eye 1 day or 1 dose.   . sodium chloride (OCEAN) 0.65 % nasal spray Place 1 spray into the nose as needed. Reported on 05/21/2015  . tadalafil (CIALIS) 5 MG tablet Take 1 tablet (5 mg total) by mouth daily.  . tamsulosin (FLOMAX) 0.4 MG CAPS capsule TAKE 1 CAPSULE BY MOUTH EVERY DAY       Depression screen [facility] 2/9 08/04/2019  Decreased Interest 0  Down, Depressed, Hopeless 2  PHQ - 2 Score 2  Altered sleeping 2  Tired, decreased energy 2  Change in appetite 0  Feeling bad or failure about yourself  0  Trouble concentrating 3  Moving slowly or fidgety/restless 0  Suicidal thoughts 0  PHQ-9 Score 9     Objective:   Today's Vitals: Ht 6\' 2"  (1.88 m)   Wt 222 lb (100.7 kg)   BMI 28.50 kg/m  Vitals with BMI 10/27/2019 08/04/2019 07/26/2019  Height 6\' 2"  6\' 2"  -  Weight 222 lbs 222 lbs 13 oz -  BMI 71.69 67.89 -  Systolic (No Data) 381 017  Diastolic (No Data) 90 91  Pulse - 80 63  Some encounter information is confidential and restricted. Go to Review Flowsheets activity to see all data.     Physical Exam  His speech on the phone appears to be normal and he does not appear to be short of breath.     Assessment   1. Acute non-recurrent maxillary sinusitis       Tests ordered No orders of the defined types were placed in this encounter.    Plan: 1. I told him that this is unlikely to be COVID-19 disease and I will treat him for acute sinusitis and I have sent antibiotics and steroids.  If he does not improve, he will let us know.  In particular, if his breathing gets worse then he should go to the emergency room.  He tells me he did get tested for COVID-19 yesterday and is awaiting the result. 2. This phone call lasted 7 minutes and 22 seconds.   Meds ordered this encounter  Medications  . amoxicillin-clavulanate (AUGMENTIN) 875-125 MG tablet    Sig: Take 1 tablet by mouth 2 (two)  times daily.    Dispense:  20 tablet    Refill:  0  . predniSONE (DELTASONE) 20 MG tablet    Sig: Take 2 tablets (40 mg total) by mouth daily with breakfast.    Dispense:  10 tablet  Refill:  1    Adylee Leonardo Luther Parody, MD

## 2019-11-01 NOTE — Progress Notes (Signed)
CARDIOLOGY CONSULT NOTE       Patient ID: William Joseph MRN: [id number] DOB/AGE: 72-Feb-1947 72 y.o.  Admit date: (Not on file) Referring Physician: Noemi [facility]  Primary Physician: Doree Albee, MD Primary Cardiologist: Previously Dr Domenic Polite and Bronson Ing Reason for Consultation: Chest Pain  Active Problems:   * No active hospital problems. *   HPI:  74 y.o. referred by Dr Sabra Heck for chest pain. He reported headache, dizziness and some atypical chest pain. Sounded like vertigo with eyes trying to catch up with head improved with Meclizine in ER He has no previous history of CAD. ECG non acute and troponin negative x 2 Received vaccine for COVID beginning of July Smoker mostly cigars Feverish at home and seen by primary Gosrani for sinusitis and Rx Augmentin and prednisone 10/27/19 Last seen by Dr Jacinta Shoe 06/12/2015 for dyspnea and fatigue Had normal myovue 05/10/15 with EF estimated at 57%   Still congested finishing antibiotics.   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 Anxiety   . Essential hypertension, benign   . History of cardiac catheterization    Normal coronaries March 2011  . History of non-Hodgkin's lymphoma 06/26/2009   Qualifier: Diagnosis of  By: Dwaine Gale, CNA, [place]    . Non Hodgkin's lymphoma ([place])   . Nonerosive nonspecific gastritis    EGD - Dr. Laural Golden  . Obsessive-compulsive disorder   . Palpitations   . Sleep apnea     Family History  Problem Relation Age of Onset  . Diabetes Father   . Coronary artery disease Mother   . Hypertension Brother   . Mental illness Maternal Uncle   . Dementia Maternal Grandfather   . Seizures Cousin   . Drug abuse Other   . Seizures Maternal Uncle   . ADD / ADHD Neg Hx   . Alcohol abuse Neg Hx   . Anxiety disorder Neg Hx   . Bipolar disorder Neg Hx   . Depression Neg Hx    . OCD Neg Hx   . Paranoid behavior Neg Hx   . Schizophrenia Neg Hx   . Sexual abuse Neg Hx   . Physical abuse Neg Hx     Social History   Socioeconomic History  . Marital status: Married    Spouse name: Not on file  . Number of children: Not on file  . Years of education: Not on file  . Highest education level: Not on file  Occupational History  . Not on file  Tobacco Use  . Smoking status: Light Tobacco Smoker    Packs/day: 1.00    Years: 1.00    Pack years: 1.00    Types: Cigars    Start date: 09/09/1964  . Smokeless tobacco: Never Used  . Tobacco comment: Puffs on cigars every so often.   Substance and Sexual Activity  . Alcohol use: Yes    Alcohol/week: 0.0 standard drinks    Comment: glass of wine occasionally  . Drug use: No  . Sexual activity: Not on file  Other Topics Concern  . Not on file  Social History Narrative  . Not on file   Social Determinants of Health   Financial Resource Strain:   . Difficulty of Paying Living Expenses:   Food Insecurity:   . Worried About Charity fundraiser in the Last Year:   .  Ran Out of Food in the Last Year:   Transportation Needs:   . Film/video editor (Medical):   Marland Kitchen Lack of Transportation (Non-Medical):   Physical Activity:   . Days of Exercise per Week:   . Minutes of Exercise per Session:   Stress:   . Feeling of Stress :   Social Connections:   . Frequency of Communication with Friends and Family:   . Frequency of Social Gatherings with Friends and Family:   . Attends Religious Services:   . Active Member of Clubs or Organizations:   . Attends Archivist Meetings:   Marland Kitchen Marital Status:   Intimate Partner Violence:   . Fear of Current or Ex-Partner:   . Emotionally Abused:   Marland Kitchen Physically Abused:   . Sexually Abused:     Past Surgical History:  Procedure Laterality Date  . COLONOSCOPY N/A 06/29/2015   Procedure: COLONOSCOPY;  Surgeon: Daneil Dolin, MD;  [facility];  Service:  Endoscopy;  Laterality: N/A;  9:30 Am - moved to 9:15 - office to notify  . ESOPHAGOGASTRODUODENOSCOPY ENDOSCOPY N/A   . EXPLORATORY LAPAROTOMY    . GLAUCOMA SURGERY Bilateral 11/2013   didnt work  . LYMPH NODE BIOPSY    . POLYPECTOMY  06/29/2015   Procedure: POLYPECTOMY;  Surgeon: Daneil Dolin, MD;  [facility];  Service: Endoscopy;;  descending colon polyp removed via cold snare  . Port-a-cath placement    . Tooth implant        Current Outpatient Medications:  .  Aspirin-Caffeine (BC FAST PAIN RELIEF PO), Take 1 packet by mouth daily as needed (pain). Reported on 04/26/2015, Disp: , Rfl:  .  B Complex Vitamins (VITAMIN B COMPLEX PO), Take by mouth., Disp: , Rfl:  .  doxycycline (VIBRA-TABS) 100 MG tablet, Take 1 tablet (100 mg total) by mouth 2 (two) times daily., Disp: 20 tablet, Rfl: 0 .  dutasteride (AVODART) 0.5 MG capsule, Take 1 capsule (0.5 mg total) by mouth daily., Disp: 90 capsule, Rfl: 1 .  fluticasone (FLONASE) 50 MCG/ACT nasal spray, Place into both nostrils daily., Disp: , Rfl:  .  ibuprofen (ADVIL) 800 MG tablet, Take 1 tablet (800 mg total) by mouth every 8 (eight) hours as needed for moderate pain., Disp: 24 tablet, Rfl: 0 .  loratadine-pseudoephedrine (CLARITIN-D 24-HOUR) 10-240 MG per 24 hr tablet, Take 1 tablet by mouth. Only as needed, Disp: , Rfl:  .  meclizine (ANTIVERT) 25 MG tablet, Take 1 tablet (25 mg total) by mouth 3 (three) times daily as needed for dizziness., Disp: 30 tablet, Rfl: 0 .  Multiple Vitamins-Minerals (MULTIVITAMIN GUMMIES ADULT PO), Take 1 capsule by mouth daily. , Disp: , Rfl:  .  Netarsudil-Latanoprost (ROCKLATAN) 0.02-0.005 % SOLN, Apply 1 drop to eye 1 day or 1 dose. , Disp: , Rfl:  .  sodium chloride (OCEAN) 0.65 % nasal spray, Place 1 spray into the nose as needed. Reported on 05/21/2015, Disp: , Rfl:  .  tadalafil (CIALIS) 5 MG tablet, Take 1 tablet (5 mg total) by mouth daily., Disp: 90 tablet, Rfl: 0 .  tamsulosin (FLOMAX)  0.4 MG CAPS capsule, TAKE 1 CAPSULE BY MOUTH EVERY DAY, Disp: 30 capsule, Rfl: 3    Physical Exam: Blood pressure 140/80, pulse 69, height 6\' 2"  (1.88 m), weight 222 lb 3.2 oz (100.8 kg), SpO2 96 %.    Affect appropriate Healthy:  appears stated age 37: normal Neck supple with no adenopathy JVP normal  no bruits no thyromegaly Lungs clear with no wheezing and good diaphragmatic motion Heart:  S1/S2 no murmur, no rub, gallop or click PMI normal Abdomen: benighn, BS positve, no tenderness, no AAA no bruit.  No HSM or HJR Distal pulses intact with no bruits No edema Neuro non-focal Skin warm and dry No muscular weakness   Labs:   Lab Results  Component Value Date   WBC 5.0 08/04/2019   HGB 13.2 08/04/2019   HCT 40.3 08/04/2019   MCV 91.6 08/04/2019   PLT 186 08/04/2019   No results for input(s): NA, K, CL, CO2, BUN, CREATININE, CALCIUM, PROT, BILITOT, ALKPHOS, ALT, AST, GLUCOSE in the last 168 hours.  Invalid input(s): LABALBU Lab Results  Component Value Date   CKTOTAL 156 06/14/2009   CKMB 1.6 06/14/2009   TROPONINI <0.30 07/28/2012    Lab Results  Component Value Date   CHOL 227 (H) 06/23/2019   CHOL 201 (A) 05/18/2017   Lab Results  Component Value Date   HDL 42 06/23/2019   HDL 46 05/18/2017   Lab Results  Component Value Date   LDLCALC 162 (H) 06/23/2019   LDLCALC 138 05/18/2017   Lab Results  Component Value Date   TRIG 111 06/23/2019   TRIG 76 05/18/2017   Lab Results  Component Value Date   CHOLHDL 5.4 (H) 06/23/2019   No results found for: LDLDIRECT    Radiology: DG Chest 2 View  Result Date: 11/09/2019 CLINICAL DATA:  Shortness of breath, hypertension, history non-Hodgkin's lymphoma 2011 EXAM: CHEST - 2 VIEW COMPARISON:  07/26/2019 FINDINGS: Normal heart size, mediastinal contours, and pulmonary vascularity. Lungs clear. No acute infiltrate, pleural effusion or pneumothorax. Osseous structures unremarkable. IMPRESSION: No acute  abnormalities. Electronically Signed   By: Lavonia Dana M.D.   On: 11/09/2019 10:17    EKG: SR rate 79 LVH nonspecific ST changes    ASSESSMENT AND PLAN:    1. Chest Pain: atypical r/o ECG LVH nonspecific ST changes Normal myovue 2017 will repeat 2. HTN:  Well controlled.  Continue current medications and low sodium Dash type diet.   3. Smoking:  Counseled on cessation < 10 minutes CXR no lesions 07/26/19  4. Vertigo:  Has meclizine improved CT head 07/26/19 no acute findings 5. 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 HLD:  LDL was 162 when checked 06/23/19 f/u primary consider statin  F/U in 6 months if myovue normal   Signed: Jenkins Rouge 11/15/2019, 10:07 AM

## 2019-11-07 ENCOUNTER — Other Ambulatory Visit (INDEPENDENT_AMBULATORY_CARE_PROVIDER_SITE_OTHER): Payer: Self-pay

## 2019-11-07 MED ORDER — DUTASTERIDE 0.5 MG PO CAPS: 0.5000 mg | ORAL_CAPSULE | Freq: Every day | ORAL | 1 refills | Status: DC

## 2019-11-08 ENCOUNTER — Telehealth (INDEPENDENT_AMBULATORY_CARE_PROVIDER_SITE_OTHER): Payer: Federal, State, Local not specified - PPO | Admitting: Nurse Practitioner

## 2019-11-08 ENCOUNTER — Ambulatory Visit (HOSPITAL_COMMUNITY)
Admission: RE | Admit: 2019-11-08 | Discharge: 2019-11-08 | Disposition: A | Discharge status: Home / Self Care | Payer: Federal, State, Local not specified - PPO | Source: Ambulatory Visit | Attending: Nurse Practitioner | Admitting: Nurse Practitioner

## 2019-11-08 ENCOUNTER — Encounter (INDEPENDENT_AMBULATORY_CARE_PROVIDER_SITE_OTHER): Payer: Self-pay | Admitting: Nurse Practitioner

## 2019-11-08 ENCOUNTER — Other Ambulatory Visit: Payer: Self-pay

## 2019-11-08 VITALS — BP 166/102 | HR 70 | Temp 94.4°F | Resp 18 | Ht 74.0 in | Wt 225.0 lb

## 2019-11-08 DIAGNOSIS — R0602 Shortness of breath: Secondary | ICD-10-CM

## 2019-11-08 MED ORDER — PREDNISONE 10 MG (21) PO TBPK: ORAL_TABLET | ORAL | 0 refills | Status: DC

## 2019-11-08 MED ORDER — DOXYCYCLINE HYCLATE 100 MG PO TABS: 100.0000 mg | ORAL_TABLET | Freq: Two times a day (BID) | ORAL | 0 refills | Status: DC

## 2019-11-08 NOTE — Progress Notes (Signed)
Due to national recommendations of social distancing related to the [place] pandemic, an audio/visual tele-health visit was felt to be the most appropriate encounter type for this patient today. I connected with  William Joseph on 11/08/19 utilizing audio/visual technology and verified that I am speaking with the correct person using two identifiers. The patient was located at their home, and I was located at the office of [facility] during the encounter. I discussed the limitations of evaluation and management by telemedicine. The patient expressed understanding and agreed to proceed.     Subjective:  Patient ID: William Joseph, male    DOB: May 11, 1945  Age: 74 y.o. MRN: [id number]  CC:  Chief Complaint  Patient presents with  . Other    Shortness of breath      HPI  This patient arrives for acute virtual visit for the above.  He tells me that for approximately 10 days he has been experiencing nasal congestion, headache, and shortness of breath with talking.  He denies cough and fever.  He was seen for a virtual visit approximately 1 week ago at which point he was prescribed Augmentin and prednisone.  He tells me he started to feel little bit better initially, but then his symptoms seem to return and have been persistent.  He has been fully vaccinated against COVID-19, and was tested for COVID-19 when his symptoms initially started and tells me his test came back negative at [facility].  He has been taking Flonase and an over-the-counter decongestant in addition to the Augmentin and prednisone without much improvement in his symptoms.   Past Medical History:  Diagnosis Date  . Anxiety   . Essential hypertension, benign   . History of cardiac catheterization    Normal coronaries March 2011  . History of non-Hodgkin's lymphoma 06/26/2009   Qualifier: Diagnosis of  By: Dwaine Gale, CNA, [place]    . Non Hodgkin's lymphoma ([place])   . Nonerosive nonspecific gastritis     EGD - Dr. Laural Golden  . Obsessive-compulsive disorder   . Palpitations   . Sleep apnea       Family History  Problem Relation Age of Onset  . Diabetes Father   . Coronary artery disease Mother   . Hypertension Brother   . Mental illness Maternal Uncle   . Dementia Maternal Grandfather   . Seizures Cousin   . Drug abuse Other   . Seizures Maternal Uncle   . ADD / ADHD Neg Hx   . Alcohol abuse Neg Hx   . Anxiety disorder Neg Hx   . Bipolar disorder Neg Hx   . Depression Neg Hx   . OCD Neg Hx   . Paranoid behavior Neg Hx   . Schizophrenia Neg Hx   . Sexual abuse Neg Hx   . Physical abuse Neg Hx     Social History   Social History Narrative  . Not on file   Social History   Tobacco Use  . Smoking status: Light Tobacco Smoker    Packs/day: 1.00    Years: 1.00    Pack years: 1.00    Types: Cigars    Start date: 09/09/1964  . Smokeless tobacco: Never Used  . Tobacco comment: Puffs on cigars every so often.   Substance Use Topics  . Alcohol use: Yes    Alcohol/week: 0.0 standard drinks    Comment: glass of wine occasionally     No outpatient medications have been marked as taking for the 11/08/19 encounter (  Video Visit) with Ailene Ards, NP.    ROS:  Review of Systems  Constitutional: Positive for malaise/fatigue.  HENT: Positive for congestion, sinus pain and sore throat.   Respiratory: Positive for shortness of breath. Negative for cough.   Neurological: Positive for headaches.     Objective:   Today's Vitals: BP (!) 166/102 (BP Location: Other (Comment), Patient Position: Sitting, Cuff Size: Normal) Comment (BP Location): wife taken in home  Pulse 70   Temp (!) 94.4 F (34.7 C) (Temporal)   Resp 18   Ht 6\' 2"  (1.88 m)   Wt 225 lb (102.1 kg)   BMI 28.89 kg/m  Vitals with BMI 11/08/2019 10/27/2019 08/04/2019  Height 6\' 2"  6\' 2"  6\' 2"   Weight 225 lbs 222 lbs 222 lbs 13 oz  BMI 28.88 19.50 93.26  Systolic 712 (No Data) 458  Diastolic 099 (No Data) 90    Pulse 70 - 80  Some encounter information is confidential and restricted. Go to Review Flowsheets activity to see all data.     Physical Exam Comprehensive physical exam not conducted today as office visit was conducted remotely.  Patient was alert, oriented x3, seemed to have appropriate judgment, did seem to get a bit short of breath with speaking at times.  No cough is noted.      Assessment and Plan   1. Shortness of breath      Plan: 1.  I am to send him for chest x-ray, I will also prescribe doxycycline, and taper pack of prednisone.  I recommended that he go and become retested for COVID-19, and if his symptoms persist or worsen over the next few days he needs to go to the emergency department.  He tells me he understands.  He will follow-up for an in person visit next week, he was encouraged to let us know if he needs to be seen before then.  Because his blood pressure was a bit elevated on exam today I recommended that he try to avoid the decongestant if at all possible, he tells me he understands.   Tests ordered Orders Placed This Encounter  Procedures  . DG Chest 2 View      Meds ordered this encounter  Medications  . doxycycline (VIBRA-TABS) 100 MG tablet    Sig: Take 1 tablet (100 mg total) by mouth 2 (two) times daily.    Dispense:  20 tablet    Refill:  0    Order Specific Question:   Supervising Provider    Answer:   Hurshel Party C [[id number]]  . predniSONE (STERAPRED UNI-PAK 21 TAB) 10 MG (21) TBPK tablet    Sig: Take 6 tablets by mouth on day 1, take 5 tablets by mouth on day 2, take 4 tablets by mouth on day 3, take 3 tablets by mouth on day 4, take 2 tablets by mouth on day 5, take 1 tablet by mouth on day 6, then stop    Dispense:  1 each    Refill:  0    Order Specific Question:   Supervising Provider    Answer:   Doree Albee [[id number]]    Patient to follow-up in 1 week, or sooner as needed.  Ailene Ards, NP

## 2019-11-10 ENCOUNTER — Other Ambulatory Visit: Payer: Self-pay

## 2019-11-10 ENCOUNTER — Other Ambulatory Visit: Payer: Federal, State, Local not specified - PPO

## 2019-11-10 DIAGNOSIS — Z20822 Contact with and (suspected) exposure to covid-19: Secondary | ICD-10-CM

## 2019-11-11 LAB — NOVEL CORONAVIRUS, NAA: SARS-CoV-2, NAA: NOT DETECTED

## 2019-11-11 LAB — SARS-COV-2, NAA 2 DAY TAT

## 2019-11-15 ENCOUNTER — Encounter: Payer: Self-pay | Admitting: Cardiovascular Disease

## 2019-11-15 ENCOUNTER — Ambulatory Visit (INDEPENDENT_AMBULATORY_CARE_PROVIDER_SITE_OTHER): Payer: Federal, State, Local not specified - PPO | Admitting: Cardiovascular Disease

## 2019-11-15 ENCOUNTER — Other Ambulatory Visit: Payer: Self-pay

## 2019-11-15 ENCOUNTER — Encounter: Payer: Self-pay | Admitting: *Deleted

## 2019-11-15 DIAGNOSIS — R079 Chest pain, unspecified: Secondary | ICD-10-CM | POA: Diagnosis not present

## 2019-11-15 NOTE — Patient Instructions (Signed)
Medication Instructions:  Your physician recommends that you continue on your current medications as directed. Please refer to the Current Medication list given to you today.  *If you need a refill on your cardiac medications before your next appointment, please call your pharmacy*   Lab Work: None today If you have labs (blood work) drawn today and your tests are completely normal, you will receive your results only by:  [place] (if you have MyChart) OR  A paper copy in the mail If you have any lab test that is abnormal or we need to change your treatment, we will call you to review the results.   Testing/Procedures: Your physician has requested that you have a lexiscan myoview. For further information please visit [email or web address]. Please follow instruction sheet, as given.     Follow-Up: At [facility], you and your health needs are our priority.  As part of our continuing mission to provide you with exceptional heart care, we have created designated Provider Care Teams.  These Care Teams include your primary Cardiologist (physician) and Advanced Practice Providers (APPs -  Physician Assistants and Nurse Practitioners) who all work together to provide you with the care you need, when you need it.  We recommend signing up for the patient portal called "MyChart".  Sign up information is provided on this After Visit Summary.  MyChart is used to connect with patients for Virtual Visits (Telemedicine).  Patients are able to view lab/test results, encounter notes, upcoming appointments, etc.  Non-urgent messages can be sent to your provider as well.   To learn more about what you can do with MyChart, go to [email or web address].    Your next appointment:   6 month(s)  The format for your next appointment:   In Person  Provider:   Jenkins Rouge, MD   Other Instructions None      Thank you for choosing [facility] !       Marland Kitchen

## 2019-11-17 ENCOUNTER — Other Ambulatory Visit: Payer: Self-pay

## 2019-11-17 ENCOUNTER — Ambulatory Visit (INDEPENDENT_AMBULATORY_CARE_PROVIDER_SITE_OTHER): Payer: Federal, State, Local not specified - PPO | Admitting: Internal Medicine

## 2019-11-17 ENCOUNTER — Encounter (INDEPENDENT_AMBULATORY_CARE_PROVIDER_SITE_OTHER): Payer: Self-pay | Admitting: Internal Medicine

## 2019-11-17 VITALS — BP 145/70 | HR 82 | Temp 97.1°F | Resp 18 | Ht 74.0 in | Wt 221.8 lb

## 2019-11-17 DIAGNOSIS — J302 Other seasonal allergic rhinitis: Secondary | ICD-10-CM | POA: Diagnosis not present

## 2019-11-17 MED ORDER — AZITHROMYCIN 250 MG PO TABS: ORAL_TABLET | ORAL | 0 refills | Status: DC

## 2019-11-17 NOTE — Progress Notes (Signed)
Metrics: Intervention Frequency ACO  Documented Smoking Status Yearly  Screened one or more times in 24 months  Cessation Counseling or  Active cessation medication Past 24 months  Past 24 months   Guideline developer: UpToDate (See UpToDate for funding source) Date Released: 2014       Wellness Office Visit  Subjective:  Patient ID: William Joseph, male    DOB: 1946-01-08  Age: 74 y.o. MRN: [id number]  CC: Chest congestion, postnasal drainage. HPI  This man comes in for an acute visit for the above symptoms.  He has had symptoms like this before and he was given doxycycline by [name]. He denies any fever, myalgias. He did see the cardiologist regarding his chest pain and he is due to have another nuclear medicine stress test.  As far as the chest pains are concerned, they do not seem to be a problem at the present time. Past Medical History:  Diagnosis Date  . Anxiety   . Essential hypertension, benign   . History of cardiac catheterization    Normal coronaries March 2011  . History of non-Hodgkin's lymphoma 06/26/2009   Qualifier: Diagnosis of  By: Dwaine Gale, CNA, [place]    . Non Hodgkin's lymphoma ([place])   . Nonerosive nonspecific gastritis    EGD - Dr. Laural Golden  . Obsessive-compulsive disorder   . Palpitations   . Sleep apnea    Past Surgical History:  Procedure Laterality Date  . COLONOSCOPY N/A 06/29/2015   Procedure: COLONOSCOPY;  Surgeon: Daneil Dolin, MD;  [facility];  Service: Endoscopy;  Laterality: N/A;  9:30 Am - moved to 9:15 - office to notify  . ESOPHAGOGASTRODUODENOSCOPY ENDOSCOPY N/A   . EXPLORATORY LAPAROTOMY    . GLAUCOMA SURGERY Bilateral 11/2013   didnt work  . LYMPH NODE BIOPSY    . POLYPECTOMY  06/29/2015   Procedure: POLYPECTOMY;  Surgeon: Daneil Dolin, MD;  [facility];  Service: Endoscopy;;  descending colon polyp removed via cold snare  . Port-a-cath placement    . Tooth implant       Family History  Problem  Relation Age of Onset  . Diabetes Father   . Coronary artery disease Mother   . Hypertension Brother   . Mental illness Maternal Uncle   . Dementia Maternal Grandfather   . Seizures Cousin   . Drug abuse Other   . Seizures Maternal Uncle   . ADD / ADHD Neg Hx   . Alcohol abuse Neg Hx   . Anxiety disorder Neg Hx   . Bipolar disorder Neg Hx   . Depression Neg Hx   . OCD Neg Hx   . Paranoid behavior Neg Hx   . Schizophrenia Neg Hx   . Sexual abuse Neg Hx   . Physical abuse Neg Hx     Social History   Social History Narrative  . Not on file   Social History   Tobacco Use  . Smoking status: Light Tobacco Smoker    Packs/day: 1.00    Years: 1.00    Pack years: 1.00    Types: Cigars    Start date: 09/09/1964  . Smokeless tobacco: Never Used  . Tobacco comment: Puffs on cigars every so often.   Substance Use Topics  . Alcohol use: Yes    Alcohol/week: 0.0 standard drinks    Comment: glass of wine occasionally    Current Meds  Medication Sig  . Aspirin-Caffeine (BC FAST PAIN RELIEF PO) Take 1 packet by  mouth daily as needed (pain). Reported on 04/26/2015  . B Complex Vitamins (VITAMIN B COMPLEX PO) Take by mouth.  . doxycycline (VIBRA-TABS) 100 MG tablet Take 1 tablet (100 mg total) by mouth 2 (two) times daily.  Marland Kitchen dutasteride (AVODART) 0.5 MG capsule Take 1 capsule (0.5 mg total) by mouth daily.  . fluticasone (FLONASE) 50 MCG/ACT nasal spray Place into both nostrils daily.  Marland Kitchen ibuprofen (ADVIL) 800 MG tablet Take 1 tablet (800 mg total) by mouth every 8 (eight) hours as needed for moderate pain.  Marland Kitchen loratadine-pseudoephedrine (CLARITIN-D 24-HOUR) 10-240 MG per 24 hr tablet Take 1 tablet by mouth. Only as needed  . meclizine (ANTIVERT) 25 MG tablet Take 1 tablet (25 mg total) by mouth 3 (three) times daily as needed for dizziness.  . Multiple Vitamins-Minerals (MULTIVITAMIN GUMMIES ADULT PO) Take 1 capsule by mouth daily.   . Netarsudil-Latanoprost (ROCKLATAN) 0.02-0.005 %  SOLN Apply 1 drop to eye 1 day or 1 dose.   . sodium chloride (OCEAN) 0.65 % nasal spray Place 1 spray into the nose as needed. Reported on 05/21/2015  . tadalafil (CIALIS) 5 MG tablet Take 1 tablet (5 mg total) by mouth daily.  . tamsulosin (FLOMAX) 0.4 MG CAPS capsule TAKE 1 CAPSULE BY MOUTH EVERY DAY      Depression screen [facility] 2/9 08/04/2019  Decreased Interest 0  Down, Depressed, Hopeless 2  PHQ - 2 Score 2  Altered sleeping 2  Tired, decreased energy 2  Change in appetite 0  Feeling bad or failure about yourself  0  Trouble concentrating 3  Moving slowly or fidgety/restless 0  Suicidal thoughts 0  PHQ-9 Score 9     Objective:   Today's Vitals: BP (!) 145/70 (BP Location: Right Arm, Patient Position: Sitting, Cuff Size: Normal)   Pulse 82   Temp (!) 97.1 F (36.2 C) (Temporal)   Resp 18   Ht 6\' 2"  (1.88 m)   Wt 221 lb 12.8 oz (100.6 kg)   SpO2 96%   BMI 28.48 kg/m  Vitals with BMI 11/17/2019 11/15/2019 11/08/2019  Height 6\' 2"  6\' 2"  6\' 2"   Weight 221 lbs 13 oz 222 lbs 3 oz 225 lbs  BMI 28.47 01.77 93.90  Systolic 300 923 300  Diastolic 70 80 762  Pulse 82 69 70  Some encounter information is confidential and restricted. Go to Review Flowsheets activity to see all data.     Physical Exam   He looks systemically well.  He does not appear to be febrile.  Sinuses are not tender.  Lung fields are entirely clear.    Assessment   1. Seasonal allergies       Tests ordered No orders of the defined types were placed in this encounter.    Plan: 1. I have recommended that he take Zyrtec over-the-counter and take it at night to see if this will help his symptoms.  I have also sent a prescription for Zithromax in case he gets worse with productive cough and fevers etc. 2. Follow-up in about 3 months time.   Meds ordered this encounter  Medications  . azithromycin (ZITHROMAX) 250 MG tablet    Sig: Take 2 tablets the first day and then 1 tablet every day for the  next 4 days    Dispense:  6 tablet    Refill:  0    Rafik Koppel Luther Parody, MD

## 2019-11-28 ENCOUNTER — Ambulatory Visit (HOSPITAL_COMMUNITY)
Admission: RE | Admit: 2019-11-28 | Discharge: 2019-11-28 | Disposition: A | Discharge status: Home / Self Care | Payer: Federal, State, Local not specified - PPO | Source: Ambulatory Visit | Attending: Cardiovascular Disease | Admitting: Cardiovascular Disease

## 2019-11-28 ENCOUNTER — Other Ambulatory Visit: Payer: Self-pay

## 2019-11-28 ENCOUNTER — Encounter (HOSPITAL_COMMUNITY): Payer: Self-pay

## 2019-11-28 ENCOUNTER — Encounter (HOSPITAL_COMMUNITY)
Admission: RE | Admit: 2019-11-28 | Discharge: 2019-11-28 | Disposition: A | Discharge status: Home / Self Care | Payer: Federal, State, Local not specified - PPO | Source: Ambulatory Visit | Attending: Cardiovascular Disease | Admitting: Cardiovascular Disease

## 2019-11-28 DIAGNOSIS — R079 Chest pain, unspecified: Secondary | ICD-10-CM | POA: Insufficient documentation

## 2019-11-28 LAB — NM MYOCAR MULTI W/SPECT W/WALL MOTION / EF
LV dias vol: 171 mL (ref 62–150)
LV sys vol: 90 mL
Peak HR: 86 {beats}/min
RATE: 0.35
Rest HR: 51 {beats}/min
SDS: 1
SRS: 3
SSS: 4
TID: 1.11

## 2019-11-28 MED ORDER — SODIUM CHLORIDE FLUSH 0.9 % IV SOLN
INTRAVENOUS | Status: AC
  Administered 2019-11-28: 10 mL via INTRAVENOUS
  Filled 2019-11-28: qty 10

## 2019-11-28 MED ORDER — TECHNETIUM TC 99M TETROFOSMIN IV KIT
10.0000 | PACK | Freq: Once | INTRAVENOUS | Status: AC | PRN
  Administered 2019-11-28: 10.5 via INTRAVENOUS

## 2019-11-28 MED ORDER — TECHNETIUM TC 99M TETROFOSMIN IV KIT
30.0000 | PACK | Freq: Once | INTRAVENOUS | Status: AC
  Administered 2019-11-28: 30.8 via INTRAVENOUS

## 2019-11-28 MED ORDER — REGADENOSON 0.4 MG/5ML IV SOLN
INTRAVENOUS | Status: AC
  Administered 2019-11-28: 0.4 mg via INTRAVENOUS
  Filled 2019-11-28: qty 5

## 2019-12-05 ENCOUNTER — Ambulatory Visit (INDEPENDENT_AMBULATORY_CARE_PROVIDER_SITE_OTHER): Payer: Federal, State, Local not specified - PPO | Admitting: Internal Medicine

## 2019-12-15 ENCOUNTER — Other Ambulatory Visit (INDEPENDENT_AMBULATORY_CARE_PROVIDER_SITE_OTHER): Payer: Self-pay | Admitting: Nurse Practitioner

## 2019-12-15 DIAGNOSIS — R3914 Feeling of incomplete bladder emptying: Secondary | ICD-10-CM

## 2019-12-19 ENCOUNTER — Other Ambulatory Visit (INDEPENDENT_AMBULATORY_CARE_PROVIDER_SITE_OTHER): Payer: Self-pay | Admitting: Nurse Practitioner

## 2019-12-19 DIAGNOSIS — N401 Enlarged prostate with lower urinary tract symptoms: Secondary | ICD-10-CM

## 2020-01-16 ENCOUNTER — Other Ambulatory Visit (INDEPENDENT_AMBULATORY_CARE_PROVIDER_SITE_OTHER): Payer: Self-pay | Admitting: Internal Medicine

## 2020-01-18 ENCOUNTER — Ambulatory Visit (INDEPENDENT_AMBULATORY_CARE_PROVIDER_SITE_OTHER): Payer: Federal, State, Local not specified - PPO

## 2020-01-18 ENCOUNTER — Other Ambulatory Visit: Payer: Self-pay

## 2020-01-18 DIAGNOSIS — Z23 Encounter for immunization: Secondary | ICD-10-CM

## 2020-01-18 NOTE — Progress Notes (Signed)
Patient was given High Dose Flu Vaccine in his Right Deltoid.  Patient tolerated the injection well. 

## 2020-02-08 ENCOUNTER — Telehealth (INDEPENDENT_AMBULATORY_CARE_PROVIDER_SITE_OTHER): Payer: Self-pay

## 2020-02-08 ENCOUNTER — Telehealth (INDEPENDENT_AMBULATORY_CARE_PROVIDER_SITE_OTHER): Payer: Federal, State, Local not specified - PPO | Admitting: Nurse Practitioner

## 2020-02-08 ENCOUNTER — Other Ambulatory Visit (INDEPENDENT_AMBULATORY_CARE_PROVIDER_SITE_OTHER): Payer: Self-pay | Admitting: Internal Medicine

## 2020-02-08 ENCOUNTER — Other Ambulatory Visit: Payer: Self-pay

## 2020-02-08 ENCOUNTER — Encounter (INDEPENDENT_AMBULATORY_CARE_PROVIDER_SITE_OTHER): Payer: Self-pay | Admitting: Nurse Practitioner

## 2020-02-08 VITALS — BP 172/112 | HR 62 | Temp 97.5°F | Resp 18 | Ht 72.0 in | Wt 221.0 lb

## 2020-02-08 DIAGNOSIS — R5383 Other fatigue: Secondary | ICD-10-CM | POA: Diagnosis not present

## 2020-02-08 DIAGNOSIS — R6883 Chills (without fever): Secondary | ICD-10-CM

## 2020-02-08 NOTE — Telephone Encounter (Signed)
Patient received the Covid vaccine booster on 02/02/2020 and on the following Friday 02/03/2020 the patient had chills, fever, and severe fatige and Saturday the patient was in bed all day sleeping. Patient did alternate Tylenol and Ibuprofen. Sunday patient was fine. On Monday patient started having congestion and seems like a sinus infection. Patient is using Nasacort and not improving. Also using a machine with Vicks to inhale and not helping much.  Please advise so I can call wife back at [phone] as she does not know what to give patient or if he needs to have a Covid test.

## 2020-02-08 NOTE — Progress Notes (Signed)
Due to national recommendations of social distancing related to the [place] pandemic, an audio-only tele-health visit was felt to be the most appropriate encounter type for this patient today. I connected with  William Joseph on 02/08/20 utilizing audio-only technology and verified that I am speaking with the correct person using two identifiers. The patient was located at their home, and I was located at the office of [facility] during the encounter. I discussed the limitations of evaluation and management by telemedicine. The patient expressed understanding and agreed to proceed.    Subjective:  Patient ID: William Joseph, male    DOB: Jun 23, 1945  Age: 74 y.o. MRN: [id number]  CC:  Chief Complaint  Patient presents with  . Fatigue  . covid tested    got booster few days ago; and been feeling horrible every since.       HPI  This patient arrives today for a virtual visit for the above.  He got his COVID-19 booster shot on October 28.  He started experiencing significant fatigue, muscle aches, congestion, pain in his arm, and headaches on October 29.  Since then his symptoms have persisted with some mild improvement.  He is very concerned about what could be causing these and how to treat them.  He did go and get a COVID-19 test completed yesterday afternoon but is awaiting the results.  He is also been experiencing chills but denies any fever on thermometer and denies difficulty breathing.  He has been taking Claritin-D to treat his symptoms with very mild symptom relief.   Past Medical History:  Diagnosis Date  . Anxiety   . Essential hypertension, benign   . History of cardiac catheterization    Normal coronaries March 2011  . History of non-Hodgkin's lymphoma 06/26/2009   Qualifier: Diagnosis of  By: Dwaine Gale, CNA, [place]    . Non Hodgkin's lymphoma ([place])   . Nonerosive nonspecific gastritis    EGD - Dr. Laural Golden  . Obsessive-compulsive disorder   .  Palpitations   . Sleep apnea       Family History  Problem Relation Age of Onset  . Diabetes Father   . Coronary artery disease Mother   . Hypertension Brother   . Mental illness Maternal Uncle   . Dementia Maternal Grandfather   . Seizures Cousin   . Drug abuse Other   . Seizures Maternal Uncle   . ADD / ADHD Neg Hx   . Alcohol abuse Neg Hx   . Anxiety disorder Neg Hx   . Bipolar disorder Neg Hx   . Depression Neg Hx   . OCD Neg Hx   . Paranoid behavior Neg Hx   . Schizophrenia Neg Hx   . Sexual abuse Neg Hx   . Physical abuse Neg Hx     Social History   Social History Narrative  . Not on file   Social History   Tobacco Use  . Smoking status: Light Tobacco Smoker    Packs/day: 1.00    Years: 1.00    Pack years: 1.00    Types: Cigars    Start date: 09/09/1964  . Smokeless tobacco: Never Used  . Tobacco comment: Puffs on cigars every so often.   Substance Use Topics  . Alcohol use: Yes    Alcohol/week: 0.0 standard drinks    Comment: glass of wine occasionally     Current Meds  Medication Sig  . Aspirin-Caffeine (BC FAST PAIN RELIEF PO) Take 1 packet by mouth daily  as needed (pain). Reported on 04/26/2015  . B Complex Vitamins (VITAMIN B COMPLEX PO) Take by mouth.  . dutasteride (AVODART) 0.5 MG capsule Take 1 capsule (0.5 mg total) by mouth daily.  . fluticasone (FLONASE) 50 MCG/ACT nasal spray Place into both nostrils daily.  . Multiple Vitamins-Minerals (MULTIVITAMIN GUMMIES ADULT PO) Take 1 capsule by mouth daily.   . Netarsudil-Latanoprost (ROCKLATAN) 0.02-0.005 % SOLN Apply 1 drop to eye 1 day or 1 dose.   . sodium chloride (OCEAN) 0.65 % nasal spray Place 1 spray into the nose as needed. Reported on 05/21/2015  . tadalafil (CIALIS) 5 MG tablet TAKE 1 TABLET(5 MG) BY MOUTH DAILY  . tamsulosin (FLOMAX) 0.4 MG CAPS capsule TAKE 1 CAPSULE BY MOUTH EVERY DAY    ROS:  Review of Systems  Constitutional: Positive for chills and malaise/fatigue.  HENT:  Positive for congestion.        (+) "raw throat"  Respiratory: Negative for cough and shortness of breath (with activity).   Cardiovascular: Negative for chest pain.  Musculoskeletal: Positive for myalgias.  Neurological: Positive for headaches.     Objective:   Today's Vitals: BP (!) 172/112 (BP Location: Right Arm, Patient Position: Sitting, Cuff Size: Normal) Comment: lt arm:154/84  Pulse 62   Temp (!) 97.5 F (36.4 C)   Resp 18   Ht 6' (1.829 m)   Wt 221 lb (100.2 kg)   BMI 29.97 kg/m  Vitals with BMI 02/08/2020 11/17/2019 11/15/2019  Height 6\' 0"  6\' 2"  6\' 2"   Weight 221 lbs 221 lbs 13 oz 222 lbs 3 oz  BMI 29.97 44.01 02.72  Systolic 536 644 034  Diastolic 742 70 80  Pulse 62 82 69  Some encounter information is confidential and restricted. Go to Review Flowsheets activity to see all data.     Physical Exam Comprehensive physical exam not conducted today as office visit was conducted over the phone.  He was able to speak in complete sentences without having to stop to breathe, he is alert and oriented, he appeared to have appropriate judgment and thought processes.      Assessment and Plan   1. Chills      Plan: 1.  I think his symptoms most likely represent side effects from his COVID-19 booster shot.  We will await COVID-19 test results for further recommendations regarding whether or not he needs to be referred to [place] infusion center for monoclonal antibody infusions.  I encouraged him to recommend that his wife be tested as well in case his results come back positive.  He tells me he understands.  I recommended he try not to take the Claritin D because I believe this is why his blood pressure is a bit elevated and instead take a plain Claritin or plain Zyrtec or plain Allegra.  He does have Allegra with antihistamine available to him at home and I encouraged him to take this as needed as this may also help him with sleep.  We also discussed the importance of  hydration and rest, as well as to take Tylenol as needed for fever or chills.  He was told that if his COVID-19 test comes back positive he needs to quarantine until at least 02/13/2020 and for at least 3 days without chills or fevers.  He tells me he understands.  He was told if his symptoms worsen especially if he experiences shortness of breath he needs to proceed to the emergency department he tells me he understands.  Tests ordered No orders of the defined types were placed in this encounter.     No orders of the defined types were placed in this encounter.   Patient to follow-up as scheduled assuming his COVID-19 test comes back negative on November 15, or sooner as needed.  This telephone conversation lasted for 17 minutes and 40 seconds.  Ailene Ards, NP'

## 2020-02-08 NOTE — Telephone Encounter (Signed)
Called patients wife and gave her the message. Patient scheduled for a virtual with [name] today at 5:45pm since that is the only opening we had available and they are going to see if they can find somewhere to get a Covid test. Wife verbalized an understanding.

## 2020-02-08 NOTE — Telephone Encounter (Signed)
He needs to have a COVID-19 test.  We could also do a telemedicine visit today, my schedule appears to be fairly booked, I think [name] can do a telemedicine visit on her schedule.  Thanks.

## 2020-02-09 ENCOUNTER — Telehealth (INDEPENDENT_AMBULATORY_CARE_PROVIDER_SITE_OTHER): Payer: Self-pay

## 2020-02-09 NOTE — Telephone Encounter (Signed)
Patients wife called to let us know that his Covid test was Negative. Patient did wake up with congestion today and a headache. Patient was advised to take Claritin, Zyrtec, or Allegra for congestion and Tylenol for headache along with rest and hydration. Patients wife verbalized an understanding. Sending as Juluis Rainier.

## 2020-02-20 ENCOUNTER — Encounter (INDEPENDENT_AMBULATORY_CARE_PROVIDER_SITE_OTHER): Payer: Self-pay | Admitting: Internal Medicine

## 2020-02-20 ENCOUNTER — Ambulatory Visit (INDEPENDENT_AMBULATORY_CARE_PROVIDER_SITE_OTHER): Payer: Federal, State, Local not specified - PPO | Admitting: Internal Medicine

## 2020-02-20 ENCOUNTER — Other Ambulatory Visit: Payer: Self-pay

## 2020-02-20 VITALS — BP 136/88 | HR 62 | Temp 97.2°F | Ht 74.0 in | Wt 223.8 lb

## 2020-02-20 DIAGNOSIS — E785 Hyperlipidemia, unspecified: Secondary | ICD-10-CM | POA: Diagnosis not present

## 2020-02-20 DIAGNOSIS — R972 Elevated prostate specific antigen [PSA]: Secondary | ICD-10-CM

## 2020-02-20 DIAGNOSIS — I10 Essential (primary) hypertension: Secondary | ICD-10-CM

## 2020-02-20 DIAGNOSIS — N401 Enlarged prostate with lower urinary tract symptoms: Secondary | ICD-10-CM

## 2020-02-20 DIAGNOSIS — R202 Paresthesia of skin: Secondary | ICD-10-CM

## 2020-02-20 DIAGNOSIS — R3914 Feeling of incomplete bladder emptying: Secondary | ICD-10-CM

## 2020-02-20 NOTE — Progress Notes (Signed)
Metrics: Intervention Frequency ACO  Documented Smoking Status Yearly  Screened one or more times in 24 months  Cessation Counseling or  Active cessation medication Past 24 months  Past 24 months   Guideline developer: UpToDate (See UpToDate for funding source) Date Released: 2014       Wellness Office Visit  Subjective:  Patient ID: William Joseph, male    DOB: 21-Jan-1946  Age: 74 y.o. MRN: [id number]  CC: This man comes in for follow-up of hypertension, hyperlipidemia, recent respiratory symptoms. HPI He had a telemedicine visit with William Joseph 12 days ago when he had some respiratory symptoms and chills after COVID-19 booster dose.  His COVID-19 test was negative and he has fully recovered now thankfully. I see from his previous records that his PSA 2 years ago was 5.2 but I do not see any follow-up regarding this.  It is clearly elevated.  He does have a history of BPH and is on 3 medications to help this which it does. Today is complaining of a burning sensation in the right index and middle fingers.  He has had this for almost 2 years.  Past Medical History:  Diagnosis Date  . Anxiety   . Essential hypertension, benign   . History of cardiac catheterization    Normal coronaries March 2011  . History of non-Hodgkin's lymphoma 06/26/2009   Qualifier: Diagnosis of  By: Dwaine Gale, CNA, [place]    . Non Hodgkin's lymphoma ([place])   . Nonerosive nonspecific gastritis    EGD - Dr. Laural Golden  . Obsessive-compulsive disorder   . Palpitations   . Sleep apnea    Past Surgical History:  Procedure Laterality Date  . COLONOSCOPY N/A 06/29/2015   Procedure: COLONOSCOPY;  Surgeon: Daneil Dolin, MD;  [facility];  Service: Endoscopy;  Laterality: N/A;  9:30 Am - moved to 9:15 - office to notify  . ESOPHAGOGASTRODUODENOSCOPY ENDOSCOPY N/A   . EXPLORATORY LAPAROTOMY    . GLAUCOMA SURGERY Bilateral 11/2013   didnt work  . LYMPH NODE BIOPSY    . POLYPECTOMY  06/29/2015   Procedure:  POLYPECTOMY;  Surgeon: Daneil Dolin, MD;  [facility];  Service: Endoscopy;;  descending colon polyp removed via cold snare  . Port-a-cath placement    . Tooth implant       Family History  Problem Relation Age of Onset  . Diabetes Father   . Coronary artery disease Mother   . Hypertension Brother   . Mental illness Maternal Uncle   . Dementia Maternal Grandfather   . Seizures Cousin   . Drug abuse Other   . Seizures Maternal Uncle   . ADD / ADHD Neg Hx   . Alcohol abuse Neg Hx   . Anxiety disorder Neg Hx   . Bipolar disorder Neg Hx   . Depression Neg Hx   . OCD Neg Hx   . Paranoid behavior Neg Hx   . Schizophrenia Neg Hx   . Sexual abuse Neg Hx   . Physical abuse Neg Hx     Social History   Social History Narrative  . Not on file   Social History   Tobacco Use  . Smoking status: Light Tobacco Smoker    Packs/day: 1.00    Years: 1.00    Pack years: 1.00    Types: Cigars    Start date: 09/09/1964  . Smokeless tobacco: Never Used  . Tobacco comment: Puffs on cigars every so often.   Substance Use Topics  .  Alcohol use: Yes    Alcohol/week: 0.0 standard drinks    Comment: glass of wine occasionally    Current Meds  Medication Sig  . Aspirin-Caffeine (BC FAST PAIN RELIEF PO) Take 1 packet by mouth daily as needed (pain). Reported on 04/26/2015  . B Complex Vitamins (VITAMIN B COMPLEX PO) Take by mouth.  . dutasteride (AVODART) 0.5 MG capsule Take 1 capsule (0.5 mg total) by mouth daily.  . fluticasone (FLONASE) 50 MCG/ACT nasal spray Place into both nostrils daily.  . Multiple Vitamins-Minerals (MULTIVITAMIN GUMMIES ADULT PO) Take 1 capsule by mouth daily.   . Netarsudil-Latanoprost (ROCKLATAN) 0.02-0.005 % SOLN Apply 1 drop to eye 1 day or 1 dose.   . sodium chloride (OCEAN) 0.65 % nasal spray Place 1 spray into the nose as needed. Reported on 05/21/2015  . tadalafil (CIALIS) 5 MG tablet TAKE 1 TABLET(5 MG) BY MOUTH DAILY  . tamsulosin (FLOMAX) 0.4  MG CAPS capsule TAKE 1 CAPSULE BY MOUTH EVERY DAY      Depression screen [facility] 2/9 02/20/2020 08/04/2019  Decreased Interest 0 0  Down, Depressed, Hopeless 2 2  PHQ - 2 Score 2 2  Altered sleeping 2 2  Tired, decreased energy 2 2  Change in appetite 0 0  Feeling bad or failure about yourself  0 0  Trouble concentrating 1 3  Moving slowly or fidgety/restless 0 0  Suicidal thoughts 0 0  PHQ-9 Score 7 9  Difficult doing work/chores Somewhat difficult -     Objective:   Today's Vitals: BP 136/88   Pulse 62   Temp (!) 97.2 F (36.2 C) (Temporal)   Ht 6\' 2"  (1.88 m)   Wt 223 lb 12.8 oz (101.5 kg)   SpO2 98%   BMI 28.73 kg/m  Vitals with BMI 02/20/2020 02/08/2020 11/17/2019  Height 6\' 2"  6\' 0"  6\' 2"   Weight 223 lbs 13 oz 221 lbs 221 lbs 13 oz  BMI 28.72 32.20 25.42  Systolic 706 237 628  Diastolic 88 315 70  Pulse 62 62 82  Some encounter information is confidential and restricted. Go to Review Flowsheets activity to see all data.     Physical Exam   He looks systemically well.  He is overweight.  Blood pressure is better than it was before.  He is alert and orientated without any focal neurological signs.  Examination of his right hand shows possibly a positive Tinel sign.    Assessment   1. Essential hypertension, benign   2. Hyperlipidemia, unspecified hyperlipidemia type   3. Benign prostatic hyperplasia with incomplete bladder emptying   4. Elevated PSA   5. Right hand paresthesia       Tests ordered Orders Placed This Encounter  Procedures  . PSA, Total with Reflex to PSA, Free  . Ambulatory referral to Neurology     Plan: 1. He will continue with tadalafil, tamsulosin and Avodart for his BPH symptoms. 2. His previous history of hypertension does not require medications. 3. He does have an elevated PSA history and we will check this again. 4. I will refer him to neurology for his right hand paresthesia. 5. Follow-up with William Joseph in 3 months.   No  orders of the defined types were placed in this encounter.   Doree Albee, MD

## 2020-03-06 ENCOUNTER — Ambulatory Visit: Payer: Federal, State, Local not specified - PPO | Admitting: Orthopedic Surgery

## 2020-03-06 ENCOUNTER — Ambulatory Visit: Payer: Medicare Other | Admitting: Neurology

## 2020-04-24 ENCOUNTER — Encounter: Payer: Self-pay | Admitting: Orthopaedic Surgery

## 2020-04-24 ENCOUNTER — Ambulatory Visit (INDEPENDENT_AMBULATORY_CARE_PROVIDER_SITE_OTHER): Payer: Federal, State, Local not specified - PPO | Admitting: Orthopaedic Surgery

## 2020-04-24 ENCOUNTER — Ambulatory Visit: Payer: Federal, State, Local not specified - PPO

## 2020-04-24 ENCOUNTER — Other Ambulatory Visit: Payer: Self-pay

## 2020-04-24 VITALS — BP 163/137 | HR 75 | Ht 74.0 in | Wt 223.0 lb

## 2020-04-24 DIAGNOSIS — W19XXXA Unspecified fall, initial encounter: Secondary | ICD-10-CM

## 2020-04-24 DIAGNOSIS — S86012A Strain of left Achilles tendon, initial encounter: Secondary | ICD-10-CM | POA: Diagnosis not present

## 2020-04-24 DIAGNOSIS — M25572 Pain in left ankle and joints of left foot: Secondary | ICD-10-CM | POA: Diagnosis not present

## 2020-04-24 NOTE — Progress Notes (Signed)
Subjective:    Patient ID: William Joseph, male    DOB: 1946/02/04, 75 y.o.   MRN: [id number]  HPI He had a misstep and fell yesterday and hurt his left posterior ankle.  He went to Urgent Care this morning and they were closed secondary to the snow we just had.  He came here this afternoon.  He has swelling and pain of the left posterior heel ara at the insertion of the Achilles.  He has a crutch.  He has no other injury. He has taken Advil and used ice and elevation.     Review of Systems  Constitutional: Positive for activity change.  Musculoskeletal: Positive for arthralgias, gait problem and joint swelling.  All other systems reviewed and are negative.  For Review of Systems, all other systems reviewed and are negative.  The following is a summary of the past history medically, past history surgically, known current medicines, social history and family history.  This information is gathered electronically by the computer from prior information and documentation.  I review this each visit and have found including this information at this point in the chart is beneficial and informative.   Past Medical History:  Diagnosis Date  . Anxiety   . Essential hypertension, benign   . History of cardiac catheterization    Normal coronaries March 2011  . History of non-Hodgkin's lymphoma 06/26/2009   Qualifier: Diagnosis of  By: Dwaine Gale, CNA, [facility]    . Non Hodgkin's lymphoma ([place])   . Nonerosive nonspecific gastritis    EGD - Dr. Laural Golden  . Obsessive-compulsive disorder   . Palpitations   . Sleep apnea     Past Surgical History:  Procedure Laterality Date  . COLONOSCOPY N/A 06/29/2015   Procedure: COLONOSCOPY;  Surgeon: Daneil Dolin, MD;  [facility];  Service: Endoscopy;  Laterality: N/A;  9:30 Am - moved to 9:15 - office to notify  . ESOPHAGOGASTRODUODENOSCOPY ENDOSCOPY N/A   . EXPLORATORY LAPAROTOMY    . GLAUCOMA SURGERY Bilateral 11/2013   didnt work  . LYMPH  NODE BIOPSY    . POLYPECTOMY  06/29/2015   Procedure: POLYPECTOMY;  Surgeon: Daneil Dolin, MD;  [facility];  Service: Endoscopy;;  descending colon polyp removed via cold snare  . Port-a-cath placement    . Tooth implant      Current Outpatient Medications on File Prior to Visit  Medication Sig Dispense Refill  . Aspirin-Caffeine (BC FAST PAIN RELIEF PO) Take 1 packet by mouth daily as needed (pain). Reported on 04/26/2015    . B Complex Vitamins (VITAMIN B COMPLEX PO) Take by mouth.    . dutasteride (AVODART) 0.5 MG capsule Take 1 capsule (0.5 mg total) by mouth daily. 90 capsule 1  . fluticasone (FLONASE) 50 MCG/ACT nasal spray Place into both nostrils daily.    . Multiple Vitamins-Minerals (MULTIVITAMIN GUMMIES ADULT PO) Take 1 capsule by mouth daily.    . Netarsudil-Latanoprost (ROCKLATAN) 0.02-0.005 % SOLN Apply 1 drop to eye 1 day or 1 dose.     . sodium chloride (OCEAN) 0.65 % nasal spray Place 1 spray into the nose as needed. Reported on 05/21/2015    . tadalafil (CIALIS) 5 MG tablet TAKE 1 TABLET(5 MG) BY MOUTH DAILY 90 tablet 0  . tamsulosin (FLOMAX) 0.4 MG CAPS capsule TAKE 1 CAPSULE BY MOUTH EVERY DAY 30 capsule 3   No current facility-administered medications on file prior to visit.    Social History  Socioeconomic History  . Marital status: Married    Spouse name: Not on file  . Number of children: Not on file  . Years of education: Not on file  . Highest education level: Not on file  Occupational History  . Not on file  Tobacco Use  . Smoking status: Light Tobacco Smoker    Packs/day: 1.00    Years: 1.00    Pack years: 1.00    Types: Cigars    Start date: 09/09/1964  . Smokeless tobacco: Never Used  . Tobacco comment: Puffs on cigars every so often.   Vaping Use  . Vaping Use: Never used  Substance and Sexual Activity  . Alcohol use: Yes    Alcohol/week: 0.0 standard drinks    Comment: glass of wine occasionally  . Drug use: No  . Sexual  activity: Not on file  Other Topics Concern  . Not on file  Social History Narrative  . Not on file   Social Determinants of Health   Financial Resource Strain: Not on file  Food Insecurity: Not on file  Transportation Needs: Not on file  Physical Activity: Not on file  Stress: Not on file  Social Connections: Not on file  Intimate Partner Violence: Not on file    Family History  Problem Relation Age of Onset  . Diabetes Father   . Coronary artery disease Mother   . Hypertension Brother   . Mental illness Maternal Uncle   . Dementia Maternal Grandfather   . Seizures Cousin   . Drug abuse Other   . Seizures Maternal Uncle   . ADD / ADHD Neg Hx   . Alcohol abuse Neg Hx   . Anxiety disorder Neg Hx   . Bipolar disorder Neg Hx   . Depression Neg Hx   . OCD Neg Hx   . Paranoid behavior Neg Hx   . Schizophrenia Neg Hx   . Sexual abuse Neg Hx   . Physical abuse Neg Hx     BP (!) 163/137   Pulse 75   Ht 6\' 2"  (1.88 m)   Wt 223 lb (101.2 kg)   BMI 28.63 kg/m   Body mass index is 28.63 kg/m.      Objective:   Physical Exam Vitals and nursing note reviewed. Exam conducted with a chaperone present.  Constitutional:      Appearance: He is well-developed and well-nourished.  HENT:     Head: Normocephalic and atraumatic.  Eyes:     Extraocular Movements: EOM normal.     Conjunctiva/sclera: Conjunctivae normal.     Pupils: Pupils are equal, round, and reactive to light.  Cardiovascular:     Rate and Rhythm: Normal rate and regular rhythm.     Pulses: Intact distal pulses.  Pulmonary:     Effort: Pulmonary effort is normal.  Abdominal:     Palpations: Abdomen is soft.  Musculoskeletal:     Cervical back: Normal range of motion and neck supple.       Feet:  Skin:    General: Skin is warm and dry.  Neurological:     Mental Status: He is alert and oriented to person, place, and time.     Cranial Nerves: No cranial nerve deficit.     Motor: No abnormal muscle  tone.     Coordination: Coordination normal.     Deep Tendon Reflexes: Reflexes are normal and symmetric. Reflexes normal.  Psychiatric:        Mood and Affect:  Mood and affect normal.        Behavior: Behavior normal.        Thought Content: Thought content normal.        Judgment: Judgment normal.    X-rays were done of the left ankle, reported separately.      Assessment & Plan:   Encounter Diagnoses  Name Primary?  . Acute left ankle pain Yes  . Strain of Achilles tendon, left, initial encounter    I have explained the findings to him.  I am concerned about partial tear of the Achilles on the left.  I will give CAM walker.  Use the crutch.  Use ice, elevation.  Use Tylenol.  Return in one week.  If worse, call.  He may need MRI.  Call if any problem.  Precautions discussed.   Electronically Signed Sanjuana Kava, MD 1/18/20222:04 PM

## 2020-05-01 ENCOUNTER — Other Ambulatory Visit: Payer: Self-pay

## 2020-05-01 ENCOUNTER — Other Ambulatory Visit (INDEPENDENT_AMBULATORY_CARE_PROVIDER_SITE_OTHER): Payer: Self-pay

## 2020-05-01 ENCOUNTER — Ambulatory Visit (INDEPENDENT_AMBULATORY_CARE_PROVIDER_SITE_OTHER): Payer: Federal, State, Local not specified - PPO | Admitting: Orthopaedic Surgery

## 2020-05-01 ENCOUNTER — Encounter: Payer: Self-pay | Admitting: Orthopaedic Surgery

## 2020-05-01 VITALS — BP 159/90 | HR 65 | Ht 74.0 in | Wt 223.0 lb

## 2020-05-01 DIAGNOSIS — N401 Enlarged prostate with lower urinary tract symptoms: Secondary | ICD-10-CM

## 2020-05-01 DIAGNOSIS — S86012D Strain of left Achilles tendon, subsequent encounter: Secondary | ICD-10-CM | POA: Diagnosis not present

## 2020-05-01 DIAGNOSIS — R3914 Feeling of incomplete bladder emptying: Secondary | ICD-10-CM

## 2020-05-01 MED ORDER — TADALAFIL 5 MG PO TABS
5.0000 mg | ORAL_TABLET | Freq: Every day | ORAL | 0 refills | Status: DC
Start: 2020-05-01 — End: 2020-06-26

## 2020-05-01 NOTE — Progress Notes (Signed)
Patient William Joseph, male DOB:18-Nov-1945, 75 y.o. [id number]  No chief complaint on file.   HPI  William Joseph is a 75 y.o. male who has pain of the distal Achilles on the left.  He has been using the CAM walker and has less pain and less swelling.  He has no new trauma.   Body mass index is 28.63 kg/m.  ROS  Review of Systems  Constitutional: Positive for activity change.  Musculoskeletal: Positive for arthralgias, gait problem and joint swelling.  All other systems reviewed and are negative.   All other systems reviewed and are negative.  The following is a summary of the past history medically, past history surgically, known current medicines, social history and family history.  This information is gathered electronically by the computer from prior information and documentation.  I review this each visit and have found including this information at this point in the chart is beneficial and informative.    Past Medical History:  Diagnosis Date  . Anxiety   . Essential hypertension, benign   . History of cardiac catheterization    Normal coronaries March 2011  . History of non-Hodgkin's lymphoma 06/26/2009   Qualifier: Diagnosis of  By: Dwaine Gale, CNA, [place]    . Non Hodgkin's lymphoma ([place])   . Nonerosive nonspecific gastritis    EGD - Dr. Laural Golden  . Obsessive-compulsive disorder   . Palpitations   . Sleep apnea     Past Surgical History:  Procedure Laterality Date  . COLONOSCOPY N/A 06/29/2015   Procedure: COLONOSCOPY;  Surgeon: Daneil Dolin, MD;  [facility];  Service: Endoscopy;  Laterality: N/A;  9:30 Am - moved to 9:15 - office to notify  . ESOPHAGOGASTRODUODENOSCOPY ENDOSCOPY N/A   . EXPLORATORY LAPAROTOMY    . GLAUCOMA SURGERY Bilateral 11/2013   didnt work  . LYMPH NODE BIOPSY    . POLYPECTOMY  06/29/2015   Procedure: POLYPECTOMY;  Surgeon: Daneil Dolin, MD;  [facility];  Service: Endoscopy;;  descending colon  polyp removed via cold snare  . Port-a-cath placement    . Tooth implant      Family History  Problem Relation Age of Onset  . Diabetes Father   . Coronary artery disease Mother   . Hypertension Brother   . Mental illness Maternal Uncle   . Dementia Maternal Grandfather   . Seizures Cousin   . Drug abuse Other   . Seizures Maternal Uncle   . ADD / ADHD Neg Hx   . Alcohol abuse Neg Hx   . Anxiety disorder Neg Hx   . Bipolar disorder Neg Hx   . Depression Neg Hx   . OCD Neg Hx   . Paranoid behavior Neg Hx   . Schizophrenia Neg Hx   . Sexual abuse Neg Hx   . Physical abuse Neg Hx     Social History Social History   Tobacco Use  . Smoking status: Light Tobacco Smoker    Packs/day: 1.00    Years: 1.00    Pack years: 1.00    Types: Cigars    Start date: 09/09/1964  . Smokeless tobacco: Never Used  . Tobacco comment: Puffs on cigars every so often.   Vaping Use  . Vaping Use: Never used  Substance Use Topics  . Alcohol use: Yes    Alcohol/week: 0.0 standard drinks    Comment: glass of wine occasionally  . Drug use: No    No Known Allergies  Current Outpatient  Medications  Medication Sig Dispense Refill  . Aspirin-Caffeine (BC FAST PAIN RELIEF PO) Take 1 packet by mouth daily as needed (pain). Reported on 04/26/2015    . B Complex Vitamins (VITAMIN B COMPLEX PO) Take by mouth.    . dutasteride (AVODART) 0.5 MG capsule Take 1 capsule (0.5 mg total) by mouth daily. 90 capsule 1  . fluticasone (FLONASE) 50 MCG/ACT nasal spray Place into both nostrils daily.    . Multiple Vitamins-Minerals (MULTIVITAMIN GUMMIES ADULT PO) Take 1 capsule by mouth daily.    . Netarsudil-Latanoprost (ROCKLATAN) 0.02-0.005 % SOLN Apply 1 drop to eye 1 day or 1 dose.     . sodium chloride (OCEAN) 0.65 % nasal spray Place 1 spray into the nose as needed. Reported on 05/21/2015    . tadalafil (CIALIS) 5 MG tablet TAKE 1 TABLET(5 MG) BY MOUTH DAILY 90 tablet 0  . tamsulosin (FLOMAX) 0.4 MG CAPS  capsule TAKE 1 CAPSULE BY MOUTH EVERY DAY 30 capsule 3   No current facility-administered medications for this visit.     Physical Exam  Blood pressure (!) 159/90, pulse 65, height 6\' 2"  (1.88 m), weight 223 lb (101.2 kg).  Constitutional: overall normal hygiene, normal nutrition, well developed, normal grooming, normal body habitus. Assistive device:CAM walker left  Musculoskeletal: gait and station Limp left, muscle tone and strength are normal, no tremors or atrophy is present.  .  Neurological: coordination overall normal.  Deep tendon reflex/nerve stretch intact.  Sensation normal.  Cranial nerves II-XII intact.   Skin:   Normal overall no scars, lesions, ulcers or rashes. No psoriasis.  Psychiatric: Alert and oriented x 3.  Recent memory intact, remote memory unclear.  Normal mood and affect. Well groomed.  Good eye contact.  Cardiovascular: overall no swelling, no varicosities, no edema bilaterally, normal temperatures of the legs and arms, no clubbing, cyanosis and good capillary refill.  Lymphatic: palpation is normal.  He has much less swelling and pain of the distal left Achilles but I feel a small defect.  I would like to get a MRI.  NV intact. There is no redness.  All other systems reviewed and are negative   The patient has been educated about the nature of the problem(s) and counseled on treatment options.  The patient appeared to understand what I have discussed and is in agreement with it.  Encounter Diagnosis  Name Primary?  . Strain of Achilles tendon, left, subsequent encounter Yes   I am concerned about tear of the Achilles.  I will get MRI.  Continue the CAM walker.  PLAN Call if any problems.  Precautions discussed.  Continue current medications.   Return to clinic 2 weeks   Electronically Signed Sanjuana Kava, MD 1/25/202210:53 AM

## 2020-05-01 NOTE — Telephone Encounter (Signed)
Called [employer] for pre certfor renewal of Cialis 5 mg ; 90 for 90 qty supply.For a year approval done. Approved :04/01/2020-05/01/2021. Letter will be sent out ot patient & provider for approval.

## 2020-05-09 ENCOUNTER — Ambulatory Visit
Admission: EM | Admit: 2020-05-09 | Discharge: 2020-05-09 | Disposition: A | Discharge status: Home / Self Care | Payer: Federal, State, Local not specified - PPO | Attending: Emergency Medicine | Admitting: Emergency Medicine

## 2020-05-09 ENCOUNTER — Encounter: Payer: Self-pay | Admitting: Emergency Medicine

## 2020-05-09 ENCOUNTER — Other Ambulatory Visit: Payer: Self-pay

## 2020-05-09 DIAGNOSIS — H6593 Unspecified nonsuppurative otitis media, bilateral: Secondary | ICD-10-CM | POA: Diagnosis not present

## 2020-05-09 MED ORDER — FLUTICASONE PROPIONATE 50 MCG/ACT NA SUSP: 1.0000 | Freq: Every day | NASAL | 0 refills | Status: DC

## 2020-05-09 MED ORDER — PREDNISONE 10 MG PO TABS: 20.0000 mg | ORAL_TABLET | Freq: Every day | ORAL | 0 refills | Status: AC

## 2020-05-09 NOTE — ED Triage Notes (Signed)
Pt states that he feels pain in his left ear canal that started yesterday. Pt states that his right ear is irritated but not as much.

## 2020-05-09 NOTE — Discharge Instructions (Signed)
Rest and drink plenty of fluids Prescribed prednisone and Flonase  Take medications as directed and to completion Continue to use OTC ibuprofen and/ or tylenol as needed for pain control Follow up with PCP if symptoms persists Return here or go to the ER if you have any new or worsening symptoms

## 2020-05-09 NOTE — ED Provider Notes (Signed)
William Joseph   [id number] 05/09/20 Arrival Time: [place]: History from: patient.  William Joseph is a 75 y.o. male who presented to the urgent care with a complaint of left ear pain that started yesterday.  Denies a precipitating event, such as swimming or wearing ear plugs.  Patient states the pain is constant and achy in character.  Patient has tried OTC medication without relief.  Symptoms are made worse with lying down.  Denies similar symptoms in the past.    Denies fever, chills, fatigue, sinus pain, rhinorrhea, ear discharge, sore throat, SOB, wheezing, chest pain, nausea, changes in bowel or bladder habits.    ROS: As per HPI.  All other pertinent ROS negative.     Past Medical History:  Diagnosis Date  . Anxiety   . Essential hypertension, benign   . History of cardiac catheterization    Normal coronaries March 2011  . History of non-Hodgkin's lymphoma 06/26/2009   Qualifier: Diagnosis of  By: Dwaine Gale, CNA, [place]    . Non Hodgkin's lymphoma ([place])   . Nonerosive nonspecific gastritis    EGD - Dr. Laural Golden  . Obsessive-compulsive disorder   . Palpitations   . Sleep apnea    Past Surgical History:  Procedure Laterality Date  . COLONOSCOPY N/A 06/29/2015   Procedure: COLONOSCOPY;  Surgeon: Daneil Dolin, MD;  [facility];  Service: Endoscopy;  Laterality: N/A;  9:30 Am - moved to 9:15 - office to notify  . ESOPHAGOGASTRODUODENOSCOPY ENDOSCOPY N/A   . EXPLORATORY LAPAROTOMY    . GLAUCOMA SURGERY Bilateral 11/2013   didnt work  . LYMPH NODE BIOPSY    . POLYPECTOMY  06/29/2015   Procedure: POLYPECTOMY;  Surgeon: Daneil Dolin, MD;  [facility];  Service: Endoscopy;;  descending colon polyp removed via cold snare  . Port-a-cath placement    . Tooth implant     No Known Allergies No current facility-administered medications on file prior to encounter.   Current Outpatient Medications on File Prior to Encounter   Medication Sig Dispense Refill  . Aspirin-Caffeine (BC FAST PAIN RELIEF PO) Take 1 packet by mouth daily as needed (pain). Reported on 04/26/2015    . B Complex Vitamins (VITAMIN B COMPLEX PO) Take by mouth.    . dutasteride (AVODART) 0.5 MG capsule Take 1 capsule (0.5 mg total) by mouth daily. 90 capsule 1  . Multiple Vitamins-Minerals (MULTIVITAMIN GUMMIES ADULT PO) Take 1 capsule by mouth daily.    . Netarsudil-Latanoprost (ROCKLATAN) 0.02-0.005 % SOLN Apply 1 drop to eye 1 day or 1 dose.     . sodium chloride (OCEAN) 0.65 % nasal spray Place 1 spray into the nose as needed. Reported on 05/21/2015    . tadalafil (CIALIS) 5 MG tablet Take 1 tablet (5 mg total) by mouth daily. 90 tablet 0  . tamsulosin (FLOMAX) 0.4 MG CAPS capsule TAKE 1 CAPSULE BY MOUTH EVERY DAY 30 capsule 3   Social History   Socioeconomic History  . Marital status: Married    Spouse name: Not on file  . Number of children: Not on file  . Years of education: Not on file  . Highest education level: Not on file  Occupational History  . Not on file  Tobacco Use  . Smoking status: Light Tobacco Smoker    Packs/day: 1.00    Years: 1.00    Pack years: 1.00    Types: Cigars    Start date: 09/09/1964  .  Smokeless tobacco: Never Used  . Tobacco comment: Puffs on cigars every so often.   Vaping Use  . Vaping Use: Never used  Substance and Sexual Activity  . Alcohol use: Yes    Alcohol/week: 0.0 standard drinks    Comment: glass of wine occasionally  . Drug use: No  . Sexual activity: Not on file  Other Topics Concern  . Not on file  Social History Narrative  . Not on file   Social Determinants of Health   Financial Resource Strain: Not on file  Food Insecurity: Not on file  Transportation Needs: Not on file  Physical Activity: Not on file  Stress: Not on file  Social Connections: Not on file  Intimate Partner Violence: Not on file   Family History  Problem Relation Age of Onset  . Diabetes Father   .  Coronary artery disease Mother   . Hypertension Brother   . Mental illness Maternal Uncle   . Dementia Maternal Grandfather   . Seizures Cousin   . Drug abuse Other   . Seizures Maternal Uncle   . ADD / ADHD Neg Hx   . Alcohol abuse Neg Hx   . Anxiety disorder Neg Hx   . Bipolar disorder Neg Hx   . Depression Neg Hx   . OCD Neg Hx   . Paranoid behavior Neg Hx   . Schizophrenia Neg Hx   . Sexual abuse Neg Hx   . Physical abuse Neg Hx     OBJECTIVE:  Vitals:   05/09/20 1204  BP: (!) 194/97  Pulse: 70  Resp: 18  Temp: 98.2 F (36.8 C)  TempSrc: Oral  SpO2: 93%     Physical Exam Vitals and nursing note reviewed.  Constitutional:      General: He is not in acute distress.    Appearance: Normal appearance. He is normal weight. He is not ill-appearing, toxic-appearing or diaphoretic.  HENT:     Right Ear: A middle ear effusion is present.     Left Ear: A middle ear effusion is present.  Cardiovascular:     Rate and Rhythm: Normal rate and regular rhythm.     Pulses: Normal pulses.     Heart sounds: Normal heart sounds. No murmur heard. No friction rub. No gallop.   Pulmonary:     Effort: Pulmonary effort is normal. No respiratory distress.     Breath sounds: Normal breath sounds. No stridor. No wheezing, rhonchi or rales.  Chest:     Chest wall: No tenderness.  Neurological:     Mental Status: He is alert and oriented to person, place, and time.     Imaging: No results found.   ASSESSMENT & PLAN:  1. Fluid level behind tympanic membrane of both ears     Meds ordered this encounter  Medications  . predniSONE (DELTASONE) 10 MG tablet    Sig: Take 2 tablets (20 mg total) by mouth daily for 7 days.    Dispense:  14 tablet    Refill:  0  . fluticasone (FLONASE) 50 MCG/ACT nasal spray    Sig: Place 1 spray into both nostrils daily for 7 days.    Dispense:  16 g    Refill:  0   Discharge instructions  Rest and drink plenty of fluids Prescribed  prednisone and Flonase  Take medications as directed and to completion Continue to use OTC ibuprofen and/ or tylenol as needed for pain control Follow up with PCP if symptoms persists  Return here or go to the ER if you have any new or worsening symptoms   Reviewed expectations re: course of current medical issues. Questions answered. Outlined signs and symptoms indicating need for more acute intervention. Patient verbalized understanding. After Visit Summary given.         Emerson Monte, FNP 05/09/20 1308

## 2020-05-10 NOTE — Progress Notes (Deleted)
CARDIOLOGY CONSULT NOTE       Patient ID: William Joseph MRN: [id number] DOB/AGE: November 29, 1945 75 y.o.  Admit date: (Not on file) Referring Physician: No[facility]  Primary Physician: Doree Albee, MD Primary Cardiologist: Previously Dr Domenic Polite and Bronson Ing Reason for Consultation: Chest Pain  Active Problems:   * No active hospital problems. *   HPI:  75 y.o. referred by Dr Sabra Heck for chest pain August 2021 . He reported headache, dizziness and some atypical chest pain. Sounded like vertigo with eyes trying to catch up with head improved with Meclizine in ER He has no previous history of CAD. 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 Anxiety   . Essential hypertension, benign   . History of cardiac catheterization    Normal coronaries March 2011  . History of non-Hodgkin's lymphoma 06/26/2009   Qualifier: Diagnosis of  By: Dwaine Gale, CNA, [place]    . Non Hodgkin's lymphoma ([place])   . Nonerosive nonspecific gastritis    EGD - Dr. Laural Golden  . Obsessive-compulsive disorder   . Palpitations   . Sleep apnea     Family History  Problem Relation Age of Onset  . Diabetes Father   . Coronary artery disease Mother   . Hypertension Brother   . Mental illness Maternal Uncle   . Dementia Maternal Grandfather   . Seizures Cousin   . Drug abuse Other   . Seizures Maternal Uncle   . ADD / ADHD Neg Hx   . Alcohol abuse Neg Hx   .  Anxiety disorder Neg Hx   . Bipolar disorder Neg Hx   . Depression Neg Hx   . OCD Neg Hx   . Paranoid behavior Neg Hx   . Schizophrenia Neg Hx   . Sexual abuse Neg Hx   . Physical abuse Neg Hx     Social History   Socioeconomic History  . Marital status: Married    Spouse name: Not on file  . Number of children: Not on file  . Years of education: Not on file  . Highest education level: Not on file  Occupational History  . Not on file  Tobacco Use  . Smoking status: Light Tobacco Smoker    Packs/day: 1.00    Years: 1.00    Pack years: 1.00    Types: Cigars    Start date: 09/09/1964  . Smokeless tobacco: Never Used  . Tobacco comment: Puffs on cigars every so often.   Vaping Use  . Vaping Use: Never used  Substance and Sexual Activity  . Alcohol use: Yes    Alcohol/week: 0.0 standard drinks    Comment: glass of wine occasionally  . Drug use: No  . Sexual activity: Not on file  Other Topics Concern  . Not on file  Social History Narrative  . Not on file   Social Determinants of Health   Financial Resource Strain: Not  on file  Food Insecurity: Not on file  Transportation Needs: Not on file  Physical Activity: Not on file  Stress: Not on file  Social Connections: Not on file  Intimate Partner Violence: Not on file    Past Surgical History:  Procedure Laterality Date  . COLONOSCOPY N/A 06/29/2015   Procedure: COLONOSCOPY;  Surgeon: Daneil Dolin, MD;  Location: [facility];  Service: Endoscopy;  Laterality: N/A;  9:30 Am - moved to 9:15 - office to notify  . ESOPHAGOGASTRODUODENOSCOPY ENDOSCOPY N/A   . EXPLORATORY LAPAROTOMY    . GLAUCOMA SURGERY Bilateral 11/2013   didnt work  . LYMPH NODE BIOPSY    . POLYPECTOMY  06/29/2015   Procedure: POLYPECTOMY;  Surgeon: Daneil Dolin, MD;  Location: [facility];  Service: Endoscopy;;  descending colon polyp removed via cold snare  . Port-a-cath placement    . Tooth implant        Current Outpatient Medications:   .  Aspirin-Caffeine (BC FAST PAIN RELIEF PO), Take 1 packet by mouth daily as needed (pain). Reported on 04/26/2015, Disp: , Rfl:  .  B Complex Vitamins (VITAMIN B COMPLEX PO), Take by mouth., Disp: , Rfl:  .  dutasteride (AVODART) 0.5 MG capsule, Take 1 capsule (0.5 mg total) by mouth daily., Disp: 90 capsule, Rfl: 1 .  fluticasone (FLONASE) 50 MCG/ACT nasal spray, Place 1 spray into both nostrils daily for 7 days., Disp: 16 g, Rfl: 0 .  Multiple Vitamins-Minerals (MULTIVITAMIN GUMMIES ADULT PO), Take 1 capsule by mouth daily., Disp: , Rfl:  .  Netarsudil-Latanoprost (ROCKLATAN) 0.02-0.005 % SOLN, Apply 1 drop to eye 1 day or 1 dose. , Disp: , Rfl:  .  predniSONE (DELTASONE) 10 MG tablet, Take 2 tablets (20 mg total) by mouth daily for 7 days., Disp: 14 tablet, Rfl: 0 .  sodium chloride (OCEAN) 0.65 % nasal spray, Place 1 spray into the nose as needed. Reported on 05/21/2015, Disp: , Rfl:  .  tadalafil (CIALIS) 5 MG tablet, Take 1 tablet (5 mg total) by mouth daily., Disp: 90 tablet, Rfl: 0 .  tamsulosin (FLOMAX) 0.4 MG CAPS capsule, TAKE 1 CAPSULE BY MOUTH EVERY DAY, Disp: 30 capsule, Rfl: 3    Physical Exam: There were no vitals taken for this visit.    Affect appropriate Healthy:  appears stated age 27: normal Neck supple with no adenopathy JVP normal no bruits no thyromegaly Lungs clear with no wheezing and good diaphragmatic motion Heart:  S1/S2 no murmur, no rub, gallop or click PMI normal Abdomen: benighn, BS positve, no tenderness, no AAA no bruit.  No HSM or HJR Distal pulses intact with no bruits No edema Neuro non-focal Skin warm and dry No muscular weakness   Labs:   Lab Results  Component Value Date   WBC 5.0 08/04/2019   HGB 13.2 08/04/2019   HCT 40.3 08/04/2019   MCV 91.6 08/04/2019   PLT 186 08/04/2019   No results for input(s): NA, K, CL, CO2, BUN, CREATININE, CALCIUM, PROT, BILITOT, ALKPHOS, ALT, AST, GLUCOSE in the last 168 hours.  Invalid input(s):  LABALBU Lab Results  Component Value Date   CKTOTAL 156 06/14/2009   CKMB 1.6 06/14/2009   TROPONINI <0.30 07/28/2012    Lab Results  Component Value Date   CHOL 227 (H) 06/23/2019   CHOL 201 (A) 05/18/2017   Lab Results  Component Value Date   HDL 42 06/23/2019   HDL 46 05/18/2017   Lab Results  Component  Value Date   LDLCALC 162 (H) 06/23/2019   LDLCALC 138 05/18/2017   Lab Results  Component Value Date   TRIG 111 06/23/2019   TRIG 76 05/18/2017   Lab Results  Component Value Date   CHOLHDL 5.4 (H) 06/23/2019   No results found for: LDLDIRECT    Radiology: DG Ankle Complete Left  Result Date: 04/24/2020 Clinical: pain at insertion of Achilles left X-rays were done of the left ankle, three views. There is soft tissue swelling of the area posterior of the ankle in the area of the distal Achilles.  No fracture is noted. Bone quality is good.  Ankle mortise is normal. Impression:  Soft tissue swelling posterior ankle at Achilles distally. Electronically Signed Sanjuana Kava, MD 1/18/20221:56 PM    EKG: SR rate 79 LVH nonspecific ST changes    ASSESSMENT AND PLAN:    1. Chest Pain: atypical r/o ECG LVH nonspecific ST changes Normal myovue 2017 and again non ischemic 11/28/19 observe  2. HTN:  Well controlled.  Continue current medications and low sodium Dash type diet.   3. Smoking:  Counseled on cessation < 10 minutes CXR no lesions 07/26/19 Needs f/u lung cancer screening CT 4. Vertigo:  Has meclizine improved CT head 07/26/19 no acute findings 5. 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 HLD:  LDL was 162 when checked 06/23/19 Start Lipitor 20 mg daily and repeat labs in 3 months  7. PSA:  History of elevation ***  F/U in 6 months   Lung cancer screening CT  Lipitor 20 mg daily F/U lipid/liver in 3 months   Signed: Jenkins Rouge 05/10/2020, 3:51 PM

## 2020-05-14 ENCOUNTER — Other Ambulatory Visit (INDEPENDENT_AMBULATORY_CARE_PROVIDER_SITE_OTHER): Payer: Self-pay | Admitting: Nurse Practitioner

## 2020-05-16 ENCOUNTER — Ambulatory Visit (HOSPITAL_COMMUNITY)
Admission: RE | Admit: 2020-05-16 | Discharge: 2020-05-16 | Disposition: A | Discharge status: Home / Self Care | Payer: Federal, State, Local not specified - PPO | Source: Ambulatory Visit | Attending: Orthopaedic Surgery | Admitting: Orthopaedic Surgery

## 2020-05-16 ENCOUNTER — Other Ambulatory Visit: Payer: Self-pay

## 2020-05-16 DIAGNOSIS — S86012D Strain of left Achilles tendon, subsequent encounter: Secondary | ICD-10-CM | POA: Insufficient documentation

## 2020-05-17 ENCOUNTER — Ambulatory Visit: Payer: Medicare Other | Admitting: Neurology

## 2020-05-18 ENCOUNTER — Ambulatory Visit: Payer: Federal, State, Local not specified - PPO | Admitting: Cardiovascular Disease

## 2020-05-22 ENCOUNTER — Ambulatory Visit (INDEPENDENT_AMBULATORY_CARE_PROVIDER_SITE_OTHER): Payer: Federal, State, Local not specified - PPO | Admitting: Orthopaedic Surgery

## 2020-05-22 ENCOUNTER — Other Ambulatory Visit: Payer: Self-pay

## 2020-05-22 ENCOUNTER — Encounter: Payer: Self-pay | Admitting: Orthopaedic Surgery

## 2020-05-22 VITALS — Ht 74.0 in | Wt 227.1 lb

## 2020-05-22 DIAGNOSIS — S86012D Strain of left Achilles tendon, subsequent encounter: Secondary | ICD-10-CM

## 2020-05-22 NOTE — Progress Notes (Signed)
Patient William Joseph, male DOB:September 22, 1945, 75 y.o. [id number]  Chief Complaint  Patient presents with  . Ankle Pain    L/here to go over the MRI. My ankle hasn't bothered me in quite awhile.    HPI  William Joseph is a 75 y.o. male who has left Achilles pain. He had MRI which showed:  IMPRESSION: 1. Mild tendinosis of the Achilles tendon. 2. Mild partial-thickness cartilage loss of the posterior aspect of the posterior subtalar joint with subchondral reactive marrow edema. Small subtalar joint effusion. 3.  No acute osseous injury of the left ankle.  I have explained the findings to him.  I have independently reviewed the MRI.     He is much better.  He is using the CAM walker.  I have told him to begin using a shoe in the house and stop the CAM walker in the house.  Use the CAM walker when he is out of the house.  If he does well after a week, then stop the CAM walker.    I will see him in three weeks. If he is much better, he can call and cancel.   Body mass index is 29.16 kg/m.  ROS  Review of Systems  Constitutional: Positive for activity change.  Musculoskeletal: Positive for arthralgias, gait problem and joint swelling.  All other systems reviewed and are negative.   All other systems reviewed and are negative.  The following is a summary of the past history medically, past history surgically, known current medicines, social history and family history.  This information is gathered electronically by the computer from prior information and documentation.  I review this each visit and have found including this information at this point in the chart is beneficial and informative.    Past Medical History:  Diagnosis Date  . Anxiety   . Essential hypertension, benign   . History of cardiac catheterization    Normal coronaries March 2011  . History of non-Hodgkin's lymphoma 06/26/2009   Qualifier: Diagnosis of  By: Dwaine Gale, CNA, [place]    . Non  Hodgkin's lymphoma ([place])   . Nonerosive nonspecific gastritis    EGD - Dr. Laural Golden  . Obsessive-compulsive disorder   . Palpitations   . Sleep apnea     Past Surgical History:  Procedure Laterality Date  . COLONOSCOPY N/A 06/29/2015   Procedure: COLONOSCOPY;  Surgeon: Daneil Dolin, MD;  Location[facility];  Service: Endoscopy;  Laterality: N/A;  9:30 Am - moved to 9:15 - office to notify  . ESOPHAGOGASTRODUODENOSCOPY ENDOSCOPY N/A   . EXPLORATORY LAPAROTOMY    . GLAUCOMA SURGERY Bilateral 11/2013   didnt work  . LYMPH NODE BIOPSY    . POLYPECTOMY  06/29/2015   Procedure: POLYPECTOMY;  Surgeon: Daneil Dolin, MD;  [facility];  Service: Endoscopy;;  descending colon polyp removed via cold snare  . Port-a-cath placement    . Tooth implant      Family History  Problem Relation Age of Onset  . Diabetes Father   . Coronary artery disease Mother   . Hypertension Brother   . Mental illness Maternal Uncle   . Dementia Maternal Grandfather   . Seizures Cousin   . Drug abuse Other   . Seizures Maternal Uncle   . ADD / ADHD Neg Hx   . Alcohol abuse Neg Hx   . Anxiety disorder Neg Hx   . Bipolar disorder Neg Hx   . Depression Neg Hx   .  OCD Neg Hx   . Paranoid behavior Neg Hx   . Schizophrenia Neg Hx   . Sexual abuse Neg Hx   . Physical abuse Neg Hx     Social History Social History   Tobacco Use  . Smoking status: Light Tobacco Smoker    Packs/day: 1.00    Years: 1.00    Pack years: 1.00    Types: Cigars    Start date: 09/09/1964  . Smokeless tobacco: Never Used  . Tobacco comment: Puffs on cigars every so often.   Vaping Use  . Vaping Use: Never used  Substance Use Topics  . Alcohol use: Yes    Alcohol/week: 0.0 standard drinks    Comment: glass of wine occasionally  . Drug use: No    No Known Allergies  Current Outpatient Medications  Medication Sig Dispense Refill  . Aspirin-Caffeine (BC FAST PAIN RELIEF PO) Take 1 packet by mouth daily as  needed (pain). Reported on 04/26/2015    . B Complex Vitamins (VITAMIN B COMPLEX PO) Take by mouth.    . dutasteride (AVODART) 0.5 MG capsule TAKE 1 CAPSULE(0.5 MG) BY MOUTH DAILY 90 capsule 1  . Multiple Vitamins-Minerals (MULTIVITAMIN GUMMIES ADULT PO) Take 1 capsule by mouth daily.    . Netarsudil-Latanoprost (ROCKLATAN) 0.02-0.005 % SOLN Apply 1 drop to eye 1 day or 1 dose.     . sodium chloride (OCEAN) 0.65 % nasal spray Place 1 spray into the nose as needed. Reported on 05/21/2015    . tadalafil (CIALIS) 5 MG tablet Take 1 tablet (5 mg total) by mouth daily. 90 tablet 0  . tamsulosin (FLOMAX) 0.4 MG CAPS capsule TAKE 1 CAPSULE BY MOUTH EVERY DAY 30 capsule 3  . fluticasone (FLONASE) 50 MCG/ACT nasal spray Place 1 spray into both nostrils daily for 7 days. 16 g 0   No current facility-administered medications for this visit.     Physical Exam  Height 6\' 2"  (1.88 m), weight 227 lb 2 oz (103 kg).  Constitutional: overall normal hygiene, normal nutrition, well developed, normal grooming, normal body habitus. Assistive device:left CAM walker  Musculoskeletal: gait and station Limp left, muscle tone and strength are normal, no tremors or atrophy is present.  .  Neurological: coordination overall normal.  Deep tendon reflex/nerve stretch intact.  Sensation normal.  Cranial nerves II-XII intact.   Skin:   Normal overall no scars, lesions, ulcers or rashes. No psoriasis.  Psychiatric: Alert and oriented x 3.  Recent memory intact, remote memory unclear.  Normal mood and affect. Well groomed.  Good eye contact.  Cardiovascular: overall no swelling, no varicosities, no edema bilaterally, normal temperatures of the legs and arms, no clubbing, cyanosis and good capillary refill.  Lymphatic: palpation is normal.  Left Achilles distally is not painful or swollen or red. ROM is full. He has some slight tenderness over posterior lateral malleolus but ROM is full.  He has no redness.  NV  intact.  All other systems reviewed and are negative   The patient has been educated about the nature of the problem(s) and counseled on treatment options.  The patient appeared to understand what I have discussed and is in agreement with it.  Encounter Diagnosis  Name Primary?  . Strain of Achilles tendon, left, subsequent encounter Yes    PLAN Call if any problems.  Precautions discussed.  Continue current medications.   Return to clinic 3 weeks   Electronically Signed Sanjuana Kava, MD 2/15/20229:36 AM

## 2020-05-23 ENCOUNTER — Ambulatory Visit (INDEPENDENT_AMBULATORY_CARE_PROVIDER_SITE_OTHER): Payer: Federal, State, Local not specified - PPO | Admitting: Nurse Practitioner

## 2020-05-23 ENCOUNTER — Encounter (INDEPENDENT_AMBULATORY_CARE_PROVIDER_SITE_OTHER): Payer: Self-pay | Admitting: Nurse Practitioner

## 2020-05-23 VITALS — BP 162/94 | HR 73 | Temp 97.7°F | Ht 74.0 in | Wt 225.4 lb

## 2020-05-23 DIAGNOSIS — I10 Essential (primary) hypertension: Secondary | ICD-10-CM

## 2020-05-23 DIAGNOSIS — R3914 Feeling of incomplete bladder emptying: Secondary | ICD-10-CM

## 2020-05-23 DIAGNOSIS — N401 Enlarged prostate with lower urinary tract symptoms: Secondary | ICD-10-CM | POA: Diagnosis not present

## 2020-05-23 DIAGNOSIS — L918 Other hypertrophic disorders of the skin: Secondary | ICD-10-CM | POA: Diagnosis not present

## 2020-05-23 DIAGNOSIS — E785 Hyperlipidemia, unspecified: Secondary | ICD-10-CM | POA: Diagnosis not present

## 2020-05-23 MED ORDER — AMLODIPINE BESYLATE 2.5 MG PO TABS: 2.5000 mg | ORAL_TABLET | Freq: Every day | ORAL | 0 refills | Status: DC

## 2020-05-23 NOTE — Patient Instructions (Signed)
Amlodipine Tablets What is this medicine? AMLODIPINE (am LOE di peen) is a calcium channel blocker. It relaxes your blood vessels and decreases the amount of work the heart has to do. It treats high blood pressure and/or prevents chest pain (also called angina). This medicine may be used for other purposes; ask your health care provider or pharmacist if you have questions. COMMON BRAND NAME(S): Norvasc What should I tell my health care provider before I take this medicine? They need to know if you have any of these conditions:  heart disease  liver disease  an unusual or allergic reaction to amlodipine, other drugs, foods, dyes, or preservatives  pregnant or trying to get pregnant  breast-feeding How should I use this medicine? Take this medicine by mouth. Take it as directed on the prescription label at the same time every day. You can take it with or without food. If it upsets your stomach, take it with food. Keep taking it unless your health care provider tells you to stop. Talk to your health care provider about the use of this medicine in children. While it may be prescribed for children as young as 6 for selected conditions, precautions do apply. Overdosage: If you think you have taken too much of this medicine contact a poison control center or emergency room at once. NOTE: This medicine is only for you. Do not share this medicine with others. What if I miss a dose? If you miss a dose, take it as soon as you can. If it is almost time for your next dose, take only that dose. Do not take double or extra doses. What may interact with this medicine? This medicine may interact with the following medications:  clarithromycin  cyclosporine  diltiazem  itraconazole  simvastatin  tacrolimus This list may not describe all possible interactions. Give your health care provider a list of all the medicines, herbs, non-prescription drugs, or dietary supplements you use. Also tell them  if you smoke, drink alcohol, or use illegal drugs. Some items may interact with your medicine. What should I watch for while using this medicine? Visit your health care provider for regular checks on your progress. Check your blood pressure as directed. Ask your health care provider what your blood pressure should be. Also, find out when you should contact him or her. Do not treat yourself for coughs, colds, or pain while you are using this medicine without asking your health care provider for advice. Some medicines may increase your blood pressure. You may get drowsy or dizzy. Do not drive, use machinery, or do anything that needs mental alertness until you know how this medicine affects you. Do not stand up or sit up quickly, especially if you are an older patient. This reduces the risk of dizzy or fainting spells. Alcohol can make you more drowsy and dizzy. Avoid alcoholic drinks. What side effects may I notice from receiving this medicine? Side effects that you should report to your doctor or health care provider as soon as possible:  allergic reactions (skin rash, itching or hives; swelling of the face, lips, or tongue)  heart attack (trouble breathing; pain or tightness in the chest, neck, back or arms; unusually weak or tired)  low blood pressure (dizziness; feeling faint or lightheaded, falls; unusually weak or tired) Side effects that usually do not require medical attention (report these to your doctor or health care provider if they continue or are bothersome):  facial flushing  nausea  palpitations  stomach pain  sudden weight gain  swelling of the ankles, feet, hands This list may not describe all possible side effects. Call your doctor for medical advice about side effects. You may report side effects to FDA at 1-800-FDA-1088. Where should I keep my medicine? Keep out of the reach of children and pets. Store at room temperature between 20 and 25 degrees C (68 and 77 degrees  F). Protect from light and moisture. Keep the container tightly closed. Get rid of any unused medicine after the expiration date. To get rid of medicines that are no longer needed or have expired:  Take the medicine to a medicine take-back program. Check with your pharmacy or law enforcement to find a location.  If you cannot return the medicine, check the label or package insert to see if the medicine should be thrown out in the garbage or flushed down the toilet. If you are not sure, ask your health care provider. If it is safe to put in the trash, empty the medicine out of the container. Mix the medicine with cat litter, dirt, coffee grounds, or other unwanted substance. Seal the mixture in a bag or container. Put it in the trash. NOTE: This sheet is a summary. It may not cover all possible information. If you have questions about this medicine, talk to your doctor, pharmacist, or health care provider.  2021 Elsevier/Gold Standard (2020-02-18 14:59:47)  

## 2020-05-23 NOTE — Progress Notes (Signed)
Subjective:  Patient ID: William Joseph, male    DOB: 10-Jun-1945  Age: 75 y.o. MRN: [id number]  CC:  Chief Complaint  Patient presents with  . Follow-up    Chronic medical conditions  . Hypertension  . Hyperlipidemia  . Other    BPH, Skin lesion      HPI  This patient arrives today for the above.  Hypertension: He has history of elevated blood pressure is not currently on any medication.  Hyperlipidemia: He has a history of hyperlipidemia and last panel was collected in March 2021.  ASCVD risk score at that time was approximately 26.6, he is not currently on any medication as of now.  LDL was 162.  BPH: He has a history of BPH and does follow with [facility].  He tells me as long as he is taking his chronic medications which include dutasteride, tamsulosin, and tadalafil his symptoms are well controlled.  He tells me that he still has some mild nocturia on his medications but this is manageable.  When he misses a dose he will experience frequent urination at night as well as during the day.  Skin lesion: He mentions a skin lesion to his right arm which erupted a few months ago.  He tells me that it has slowly gotten bigger and changed color and would like this to be evaluated.  Past Medical History:  Diagnosis Date  . Anxiety   . Essential hypertension, benign   . History of cardiac catheterization    Normal coronaries March 2011  . History of non-Hodgkin's lymphoma 06/26/2009   Qualifier: Diagnosis of  By: Dwaine Gale, CNA, [place]    . Non Hodgkin's lymphoma ([place])   . Nonerosive nonspecific gastritis    EGD - Dr. Laural Golden  . Obsessive-compulsive disorder   . Palpitations   . Sleep apnea       Family History  Problem Relation Age of Onset  . Diabetes Father   . Coronary artery disease Mother   . Hypertension Brother   . Mental illness Maternal Uncle   . Dementia Maternal Grandfather   . Seizures Cousin   . Drug abuse Other   . Seizures Maternal Uncle    . ADD / ADHD Neg Hx   . Alcohol abuse Neg Hx   . Anxiety disorder Neg Hx   . Bipolar disorder Neg Hx   . Depression Neg Hx   . OCD Neg Hx   . Paranoid behavior Neg Hx   . Schizophrenia Neg Hx   . Sexual abuse Neg Hx   . Physical abuse Neg Hx     Social History   Social History Narrative  . Not on file   Social History   Tobacco Use  . Smoking status: Light Tobacco Smoker    Packs/day: 1.00    Years: 1.00    Pack years: 1.00    Types: Cigars    Start date: 09/09/1964  . Smokeless tobacco: Never Used  . Tobacco comment: Puffs on cigars every so often.   Substance Use Topics  . Alcohol use: Yes    Alcohol/week: 0.0 standard drinks    Comment: glass of wine occasionally     Current Meds  Medication Sig  . amLODipine (NORVASC) 2.5 MG tablet Take 1 tablet (2.5 mg total) by mouth daily.  . Aspirin-Caffeine (BC FAST PAIN RELIEF PO) Take 1 packet by mouth daily as needed (pain). Reported on 04/26/2015  . B Complex Vitamins (VITAMIN B COMPLEX PO) Take  by mouth.  . dutasteride (AVODART) 0.5 MG capsule TAKE 1 CAPSULE(0.5 MG) BY MOUTH DAILY  . Multiple Vitamins-Minerals (MULTIVITAMIN GUMMIES ADULT PO) Take 1 capsule by mouth daily.  . Netarsudil-Latanoprost (ROCKLATAN) 0.02-0.005 % SOLN Apply 1 drop to eye 1 day or 1 dose.   . sodium chloride (OCEAN) 0.65 % nasal spray Place 1 spray into the nose as needed. Reported on 05/21/2015  . tadalafil (CIALIS) 5 MG tablet Take 1 tablet (5 mg total) by mouth daily.  . tamsulosin (FLOMAX) 0.4 MG CAPS capsule TAKE 1 CAPSULE BY MOUTH EVERY DAY    ROS:  Review of Systems  Eyes: Negative for blurred vision.  Respiratory: Negative for shortness of breath.   Cardiovascular: Negative for chest pain.  Neurological: Negative for dizziness and headaches.     Objective:   Today's Vitals: BP (!) 162/94   Pulse 73   Temp 97.7 F (36.5 C) (Temporal)   Ht 6\' 2"  (1.88 m)   Wt 225 lb 6.4 oz (102.2 kg)   SpO2 97%   BMI 28.94 kg/m  Vitals with  BMI 05/23/2020 05/23/2020 05/22/2020  Height - 6\' 2"  6\' 2"   Weight - 225 lbs 6 oz 227 lbs 2 oz  BMI - 16.10 96.04  Systolic 540 981 -  Diastolic 94 191 -  Pulse - 73 -  Some encounter information is confidential and restricted. Go to Review Flowsheets activity to see all data.     Physical Exam Vitals reviewed.  Constitutional:      Appearance: Normal appearance.  HENT:     Head: Normocephalic and atraumatic.  Cardiovascular:     Rate and Rhythm: Normal rate and regular rhythm.  Pulmonary:     Effort: Pulmonary effort is normal.     Breath sounds: Normal breath sounds.  Musculoskeletal:     Cervical back: Neck supple.  Skin:    General: Skin is warm and dry.       Neurological:     Mental Status: He is alert and oriented to person, place, and time.  Psychiatric:        Mood and Affect: Mood normal.        Behavior: Behavior normal.        Thought Content: Thought content normal.        Judgment: Judgment normal.          Assessment and Plan   1. Skin tag   2. Essential hypertension, benign   3. Benign prostatic hyperplasia with incomplete bladder emptying   4. Hyperlipidemia, unspecified hyperlipidemia type      Plan: 1.  I think the skin lesion he showed me is a simple skin tag.  Will refer him to dermatology if this can be evaluated and possibly removed. 2.  We did discuss that his blood pressure is above goal and has remained elevated on multiple blood pressure checks in addition it was elevated on second recheck today.  I have recommended that he start an antihypertensive and we have elected to try amlodipine 2.5 mg by mouth daily.  We did discuss that common side effects include dizziness and fatigue, and that if these side effects occur but remain mild that he should try to continue take the medication as these can sometimes resolve over time.  He is agreeable to this and will follow up in 2 weeks to check his blood pressure and see how he is tolerating the  amlodipine. 3.  Encouraged him to follow-up with [facility] as scheduled,  he tells me he plans on doing this. 4.  We did discuss his ASCVD risk score based on blood work collected last year, and that we may need to discuss medication management of his cholesterol will be repeat it later this year.     Tests ordered Orders Placed This Encounter  Procedures  . Ambulatory referral to Dermatology      Meds ordered this encounter  Medications  . amLODipine (NORVASC) 2.5 MG tablet    Sig: Take 1 tablet (2.5 mg total) by mouth daily.    Dispense:  90 tablet    Refill:  0    Order Specific Question:   Supervising Provider    Answer:   Doree Albee [[id number]]    Patient to follow-up in 2 weeks for blood pressure check or sooner as needed.  Ailene Ards, NP

## 2020-06-06 ENCOUNTER — Ambulatory Visit (INDEPENDENT_AMBULATORY_CARE_PROVIDER_SITE_OTHER): Payer: Federal, State, Local not specified - PPO | Admitting: Nurse Practitioner

## 2020-06-12 ENCOUNTER — Ambulatory Visit (INDEPENDENT_AMBULATORY_CARE_PROVIDER_SITE_OTHER): Payer: Federal, State, Local not specified - PPO | Admitting: Orthopaedic Surgery

## 2020-06-12 ENCOUNTER — Other Ambulatory Visit: Payer: Self-pay

## 2020-06-12 ENCOUNTER — Encounter: Payer: Self-pay | Admitting: Orthopaedic Surgery

## 2020-06-12 VITALS — BP 173/91 | HR 56 | Ht 74.0 in | Wt 225.0 lb

## 2020-06-12 DIAGNOSIS — G8929 Other chronic pain: Secondary | ICD-10-CM

## 2020-06-12 DIAGNOSIS — M25572 Pain in left ankle and joints of left foot: Secondary | ICD-10-CM | POA: Diagnosis not present

## 2020-06-12 NOTE — Progress Notes (Signed)
My ankle is a little sore  He has no further Achilles pain.  He has pain and some swelling laterally.  He went to a funeral and walked on unlevel ground recently and the ankle flared up.  His swelling is laterally but has gone down a lot.  He has full ROM of the left ankle.  He has some lateral tenderness of the anterior talofibular ligament.  NV intact.  He has no pain of the left Achilles.  Encounter Diagnosis  Name Primary?  . Chronic pain of left ankle Yes   I have reassured him.  I will see him as needed.  Call if any problem.  Precautions discussed.   Electronically Signed Sanjuana Kava, MD 3/8/20229:11 AM

## 2020-06-26 ENCOUNTER — Other Ambulatory Visit (INDEPENDENT_AMBULATORY_CARE_PROVIDER_SITE_OTHER): Payer: Self-pay | Admitting: Internal Medicine

## 2020-06-26 DIAGNOSIS — R3914 Feeling of incomplete bladder emptying: Secondary | ICD-10-CM

## 2020-06-26 DIAGNOSIS — N401 Enlarged prostate with lower urinary tract symptoms: Secondary | ICD-10-CM

## 2020-06-28 ENCOUNTER — Other Ambulatory Visit: Payer: Self-pay

## 2020-06-28 ENCOUNTER — Encounter (INDEPENDENT_AMBULATORY_CARE_PROVIDER_SITE_OTHER): Payer: Self-pay | Admitting: Nurse Practitioner

## 2020-06-28 ENCOUNTER — Ambulatory Visit (INDEPENDENT_AMBULATORY_CARE_PROVIDER_SITE_OTHER): Payer: Federal, State, Local not specified - PPO | Admitting: Nurse Practitioner

## 2020-06-28 VITALS — BP 164/98 | HR 62 | Temp 96.6°F | Ht 74.0 in | Wt 221.8 lb

## 2020-06-28 DIAGNOSIS — R0789 Other chest pain: Secondary | ICD-10-CM

## 2020-06-28 DIAGNOSIS — J309 Allergic rhinitis, unspecified: Secondary | ICD-10-CM | POA: Diagnosis not present

## 2020-06-28 DIAGNOSIS — I10 Essential (primary) hypertension: Secondary | ICD-10-CM

## 2020-06-28 MED ORDER — LOSARTAN POTASSIUM 25 MG PO TABS: 25.0000 mg | ORAL_TABLET | Freq: Every day | ORAL | 2 refills | Status: DC

## 2020-06-28 NOTE — Patient Instructions (Signed)
Flonase daily, corcidin as directed on package, tylenol 500mg  every 8 hours as needed.   Start losartan, take 1 tablet daily for blood pressure:  Losartan Tablets What is this medicine? LOSARTAN (loe SAR tan) is an angiotensin II receptor blocker, also known as an ARB. It treats high blood pressure. It can slow kidney damage in some patients. It may also be used to lower the risk of stroke. This medicine may be used for other purposes; ask your health care provider or pharmacist if you have questions. COMMON BRAND NAME(S): Cozaar What should I tell my health care provider before I take this medicine? They need to know if you have any of these conditions:  heart failure  kidney disease  liver disease  an unusual or allergic reaction to losartan, other medicines, foods, dyes, or preservatives  pregnant or trying to get pregnant  breast-feeding How should I use this medicine? Take this medicine by mouth. Take it as directed on the prescription label at the same time every day. You can take it with or without food. If it upsets your stomach, take it with food. Keep taking it unless your health care provider tells you to stop. Talk to your health care provider about the use of this medicine in children. While it may be prescribed for children as young as 6 for selected conditions, precautions do apply. Overdosage: If you think you have taken too much of this medicine contact a poison control center or emergency room at once. NOTE: This medicine is only for you. Do not share this medicine with others. What if I miss a dose? If you miss a dose, take it as soon as you can. If it is almost time for your next dose, take only that dose. Do not take double or extra doses. What may interact with this medicine?  aliskiren  ACE inhibitors, like enalapril or lisinopril  diuretics, especially amiloride, eplerenone, spironolactone, or triamterene  lithium  NSAIDs, medicines for pain and  inflammation, like ibuprofen or naproxen  potassium salts or potassium supplements This list may not describe all possible interactions. Give your health care provider a list of all the medicines, herbs, non-prescription drugs, or dietary supplements you use. Also tell them if you smoke, drink alcohol, or use illegal drugs. Some items may interact with your medicine. What should I watch for while using this medicine? Visit your health care provider for regular check ups. Check your blood pressure as directed. Ask your health care provider what your blood pressure should be. Also, find out when you should contact him or her. Do not treat yourself for coughs, colds, or pain while you are using this medicine without asking your health care provider for advice. Some medicines may increase your blood pressure. Women should inform their health care provider if they wish to become pregnant or think they might be pregnant. There is a potential for serious side effects to an unborn child. Talk to your health care provider for more information. You may get drowsy or dizzy. Do not drive, use machinery, or do anything that needs mental alertness until you know how this medicine affects you. Do not stand or sit up quickly, especially if you are an older patient. This reduces the risk of dizzy or fainting spells. Alcohol can make you more drowsy and dizzy. Avoid alcoholic drinks. Avoid salt substitutes unless you are told otherwise by your health care provider. What side effects may I notice from receiving this medicine? Side effects that you  should report to your doctor or health care professional as soon as possible:  allergic reactions (skin rash, itching or hives, swelling of the hands, feet, face, lips, throat, or tongue)  breathing problems  high potassium levels (chest pain; or fast, irregular heartbeat; muscle weakness)  kidney injury (trouble passing urine or change in the amount of urine)  low blood  pressure (dizziness; feeling faint or lightheaded, falls; unusually weak or tired) Side effects that usually do not require medical attention (report to your doctor or health care professional if they continue or are bothersome):  cough  headache  nasal congestion or stuffiness  nausea or stomach pain This list may not describe all possible side effects. Call your doctor for medical advice about side effects. You may report side effects to FDA at 1-800-FDA-1088. Where should I keep my medicine? Keep out of the reach of children and pets. Store at room temperature between 20 and 25 degrees C (68 and 77 degrees F). Protect from light. Keep the container tightly closed. Get rid of any unused medicine after the expiration date. To get rid of medicines that are no longer needed or have expired:  Take the medicine to a medicine take-back program. Check with your pharmacy or law enforcement to find a location.  If you cannot return the medicine, check the label or package insert to see if the medicine should be thrown out in the garbage or flushed down the toilet. If you are not sure, ask your health care provider. If it is safe to put in the trash, empty the medicine out of the container. Mix the medicine with cat litter, dirt, coffee grounds, or other unwanted substance. Seal the mixture in a bag or container. Put it in the trash. NOTE: This sheet is a summary. It may not cover all possible information. If you have questions about this medicine, talk to your doctor, pharmacist, or health care provider.  2021 Elsevier/Gold Standard (2019-06-02 16:16:09)

## 2020-06-28 NOTE — Progress Notes (Addendum)
Subjective:  Patient ID: William Joseph, male    DOB: 08-15-1945  Age: 75 y.o. MRN: [id number]  CC:  Chief Complaint  Patient presents with  . Follow-up    Check BP, patient is SOB today and feeling tired, has a sore on each side of left ankle/lower leg from wearing boot      HPI  This patient arrives today for the above.  Hypertension: We started him on amlodipine at last office visit but he tells me he did not tolerate the medicine so he stopped taking it.  Chest pressure/nasal congestion: He woke up this morning with some chest pressure in addition to nasal congestion and headache.  The chest pressure improved once he turned on his air conditioner at home.  He tells me has been having a lot of nasal congestion and headaches over the last month or so and it seems to have worsened over the last couple of days which she contributes to the weather and allergy season.  He has been taking Flonase nasal spray as well as Claritin-D and BC Goody powder for headache.  He tells me that since he is been sitting in the room he is pulled his mouth down his breathing has vastly improved and the chest pressure has resolved as well.  Past Medical History:  Diagnosis Date  . Anxiety   . Essential hypertension, benign   . History of cardiac catheterization    Normal coronaries March 2011  . History of non-Hodgkin's lymphoma 06/26/2009   Qualifier: Diagnosis of  By: Dwaine Gale, CNA, [place]    . Non Hodgkin's lymphoma ([place])   . Nonerosive nonspecific gastritis    EGD - Dr. Laural Golden  . Obsessive-compulsive disorder   . Palpitations   . Sleep apnea       Family History  Problem Relation Age of Onset  . Diabetes Father   . Coronary artery disease Mother   . Hypertension Brother   . Mental illness Maternal Uncle   . Dementia Maternal Grandfather   . Seizures Cousin   . Drug abuse Other   . Seizures Maternal Uncle   . ADD / ADHD Neg Hx   . Alcohol abuse Neg Hx   . Anxiety disorder Neg  Hx   . Bipolar disorder Neg Hx   . Depression Neg Hx   . OCD Neg Hx   . Paranoid behavior Neg Hx   . Schizophrenia Neg Hx   . Sexual abuse Neg Hx   . Physical abuse Neg Hx     Social History   Social History Narrative  . Not on file   Social History   Tobacco Use  . Smoking status: Light Tobacco Smoker    Packs/day: 1.00    Years: 1.00    Pack years: 1.00    Types: Cigars    Start date: 09/09/1964  . Smokeless tobacco: Never Used  . Tobacco comment: Puffs on cigars every so often.   Substance Use Topics  . Alcohol use: Yes    Alcohol/week: 0.0 standard drinks    Comment: glass of wine occasionally     Current Meds  Medication Sig  . Aspirin-Caffeine (BC FAST PAIN RELIEF PO) Take 1 packet by mouth daily as needed (pain). Reported on 04/26/2015  . B Complex Vitamins (VITAMIN B COMPLEX PO) Take by mouth.  . dutasteride (AVODART) 0.5 MG capsule TAKE 1 CAPSULE(0.5 MG) BY MOUTH DAILY  . losartan (COZAAR) 25 MG tablet Take 1 tablet (25 mg total)  by mouth daily.  . Multiple Vitamins-Minerals (MULTIVITAMIN GUMMIES ADULT PO) Take 1 capsule by mouth daily.  . Netarsudil-Latanoprost (ROCKLATAN) 0.02-0.005 % SOLN Apply 1 drop to eye 1 day or 1 dose.   . sodium chloride (OCEAN) 0.65 % nasal spray Place 1 spray into the nose as needed. Reported on 05/21/2015  . tadalafil (CIALIS) 5 MG tablet TAKE 1 TABLET(5 MG) BY MOUTH DAILY  . tamsulosin (FLOMAX) 0.4 MG CAPS capsule TAKE 1 CAPSULE BY MOUTH EVERY DAY  . [DISCONTINUED] amLODipine (NORVASC) 2.5 MG tablet Take 1 tablet (2.5 mg total) by mouth daily.    ROS:  Review of Systems  Constitutional: Negative for fever.  HENT: Positive for congestion.   Respiratory: Positive for shortness of breath.   Cardiovascular: Positive for chest pain.  Neurological: Positive for headaches.     Objective:   Today's Vitals: BP (!) 164/98   Pulse 62   Temp (!) 96.6 F (35.9 C) (Temporal)   Ht 6\' 2"  (1.88 m)   Wt 221 lb 12.8 oz (100.6 kg)    SpO2 98%   BMI 28.48 kg/m  Vitals with BMI 06/28/2020 06/28/2020 06/12/2020  Height - 6\' 2"  6\' 2"   Weight - 221 lbs 13 oz 225 lbs  BMI - 72.09 47.09  Systolic 628 366 294  Diastolic 98 765 91  Pulse - 62 56  Some encounter information is confidential and restricted. Go to Review Flowsheets activity to see all data.     Physical Exam Vitals reviewed.  Constitutional:      Appearance: Normal appearance.  HENT:     Head: Normocephalic and atraumatic.  Cardiovascular:     Rate and Rhythm: Normal rate and regular rhythm.  Pulmonary:     Effort: Pulmonary effort is normal.     Breath sounds: Normal breath sounds.  Musculoskeletal:     Cervical back: Neck supple.  Skin:    General: Skin is warm and dry.  Neurological:     Mental Status: He is alert and oriented to person, place, and time.  Psychiatric:        Mood and Affect: Mood normal.        Behavior: Behavior normal.        Thought Content: Thought content normal.        Judgment: Judgment normal.       EKG: Sinus pericardia, no ST segment elevation.  Signs of LVH.   Assessment and Plan   1. Chest pressure   2. Essential hypertension, benign   3. Allergic rhinitis, unspecified seasonality, unspecified trigger      Plan: 1..3,  I think ACS is unlikely at this time as his symptoms have vastly improved with use of air conditioning and removing the mask.  It sounds like he has some significant allergic rhinitis and between the recent weather changes, allergy season starting, and wearing a mask/being hot he started to feel unwell.  I did review EKG and discussed the situation with Dr. Anastasio Champion and recommendation for now is to send patient to cardiology on an outpatient basis for further work-up of signs of LVH on his EKG.  I did instruct the patient that if the chest pressure comes back he really should go to the emergency department to rule out ACS.  He tells me he understands.  I recommended he stop taking Claritin-D instead  take Coricidin as needed, Tylenol (no more than 3000 mg total in 24 hours-he was notified the Coricidin has Tylenol as an ingredient), and Flonase  for his nasal congestion.  He tells me he understands. 2.  Blood pressure remains elevated this may be partially related to taking Claritin-D, but his blood pressures been elevated prior to taking Claritin-D in previous visits.  Amlodipine will be removed from his list but I will start him on low-dose losartan.  Did discuss possible negative side effects and especially if he were to experience angioedema had to identify this and what to do about it.  He tells me he understands.   Tests ordered Orders Placed This Encounter  Procedures  . Ambulatory referral to Cardiology  . EKG 12-Lead      Meds ordered this encounter  Medications  . losartan (COZAAR) 25 MG tablet    Sig: Take 1 tablet (25 mg total) by mouth daily.    Dispense:  30 tablet    Refill:  2    Order Specific Question:   Supervising Provider    Answer:   Doree Albee [[id number]]    Patient to follow-up in 2 weeks or sooner as needed.  Ailene Ards, NP

## 2020-07-12 ENCOUNTER — Ambulatory Visit (INDEPENDENT_AMBULATORY_CARE_PROVIDER_SITE_OTHER): Payer: Federal, State, Local not specified - PPO | Admitting: Nurse Practitioner

## 2020-07-12 ENCOUNTER — Encounter (INDEPENDENT_AMBULATORY_CARE_PROVIDER_SITE_OTHER): Payer: Self-pay | Admitting: Nurse Practitioner

## 2020-07-12 ENCOUNTER — Other Ambulatory Visit: Payer: Self-pay

## 2020-07-12 VITALS — BP 132/84 | HR 56 | Temp 97.5°F | Ht 74.0 in | Wt 220.6 lb

## 2020-07-12 DIAGNOSIS — H60502 Unspecified acute noninfective otitis externa, left ear: Secondary | ICD-10-CM

## 2020-07-12 DIAGNOSIS — I10 Essential (primary) hypertension: Secondary | ICD-10-CM | POA: Diagnosis not present

## 2020-07-12 DIAGNOSIS — S90512D Abrasion, left ankle, subsequent encounter: Secondary | ICD-10-CM | POA: Diagnosis not present

## 2020-07-12 DIAGNOSIS — M25561 Pain in right knee: Secondary | ICD-10-CM

## 2020-07-12 MED ORDER — NEOMYCIN-POLYMYXIN-HC 3.5-10000-1 OT SOLN: 3.0000 [drp] | Freq: Four times a day (QID) | OTIC | 0 refills | Status: DC

## 2020-07-12 NOTE — Progress Notes (Signed)
Subjective:  Patient ID: William Joseph, male    DOB: 08-18-1945  Age: 75 y.o. MRN: [id number]  CC:  Chief Complaint  Patient presents with  . Follow-up    States he is moving slow today, wax is draining out of left ear, sore is healing slowly on left foot, right knee popped and has been hurting  . Knee Pain  . Hypertension  . Other    Ear discomfort, left foot sore      HPI  This patient arrives today for the above.  Right knee pain: Patient tells me that he was getting up today and he heard a "pop", since then has been having some discomfort to his right knee.  He tells me he is able to bear weight and walk.  He tells me walking down stairs seem to aggravate it the most.  He tells me that he did have an accident years ago which affected his right knee.  He has not really tried anything to treat the pain as of right now.  He reports the pain is moderate in intensity.  Hypertension: At last office visit we started him on losartan 25 mg daily as he was not able to tolerate amlodipine.  He tells me he is feeling well on the medication currently.  Ear discomfort: He has been experiencing left ear drainage over the last 2 to 3 days.  He also has been noticing some intermittent irritation with mild pain.  He has noticed some popping as well.  Left foot: He has 2 healing sores on his left ankle.  One is on the lateral aspect and the other is on the medial aspect.  This is secondary to rubbing from a boot that he was wearing.  He is concerned as he feels that the sore is healing slowly.  He would like this to be evaluated.  His wife accompanies him today and tells me that she has been applying Vaseline to the area.  The sore occurred approximately 3 weeks ago, but has not completely healed.  Past Medical History:  Diagnosis Date  . Anxiety   . Essential hypertension, benign   . History of cardiac catheterization    Normal coronaries March 2011  . History of non-Hodgkin's  lymphoma 06/26/2009   Qualifier: Diagnosis of  By: Dwaine Gale, CNA, [place]    . Non Hodgkin's lymphoma ([place])   . Nonerosive nonspecific gastritis    EGD - Dr. Laural Golden  . Obsessive-compulsive disorder   . Palpitations   . Sleep apnea       Family History  Problem Relation Age of Onset  . Diabetes Father   . Coronary artery disease Mother   . Hypertension Brother   . Mental illness Maternal Uncle   . Dementia Maternal Grandfather   . Seizures Cousin   . Drug abuse Other   . Seizures Maternal Uncle   . ADD / ADHD Neg Hx   . Alcohol abuse Neg Hx   . Anxiety disorder Neg Hx   . Bipolar disorder Neg Hx   . Depression Neg Hx   . OCD Neg Hx   . Paranoid behavior Neg Hx   . Schizophrenia Neg Hx   . Sexual abuse Neg Hx   . Physical abuse Neg Hx     Social History   Social History Narrative  . Not on file   Social History   Tobacco Use  . Smoking status: Light Tobacco Smoker    Packs/day: 1.00  Years: 1.00    Pack years: 1.00    Types: Cigars    Start date: 09/09/1964  . Smokeless tobacco: Never Used  . Tobacco comment: Puffs on cigars every so often.   Substance Use Topics  . Alcohol use: Yes    Alcohol/week: 0.0 standard drinks    Comment: glass of wine occasionally     Current Meds  Medication Sig  . Aspirin-Caffeine (BC FAST PAIN RELIEF PO) Take 1 packet by mouth daily as needed (pain). Reported on 04/26/2015  . B Complex Vitamins (VITAMIN B COMPLEX PO) Take by mouth.  . dutasteride (AVODART) 0.5 MG capsule TAKE 1 CAPSULE(0.5 MG) BY MOUTH DAILY  . losartan (COZAAR) 25 MG tablet Take 1 tablet (25 mg total) by mouth daily.  . Multiple Vitamins-Minerals (MULTIVITAMIN GUMMIES ADULT PO) Take 1 capsule by mouth daily.  Marland Kitchen neomycin-polymyxin-hydrocortisone (CORTISPORIN) OTIC solution Place 3 drops into the left ear 4 (four) times daily.  . Netarsudil-Latanoprost (ROCKLATAN) 0.02-0.005 % SOLN Apply 1 drop to eye 1 day or 1 dose.   . sodium chloride (OCEAN) 0.65 % nasal  spray Place 1 spray into the nose as needed. Reported on 05/21/2015  . tadalafil (CIALIS) 5 MG tablet TAKE 1 TABLET(5 MG) BY MOUTH DAILY  . tamsulosin (FLOMAX) 0.4 MG CAPS capsule TAKE 1 CAPSULE BY MOUTH EVERY DAY    ROS:  Review of Systems  HENT: Positive for ear discharge and ear pain.   Respiratory: Positive for shortness of breath (intermittently, stable).   Cardiovascular: Negative for chest pain.  Musculoskeletal: Positive for joint pain (right knee).  Neurological: Positive for headaches (intermittently). Negative for dizziness.     Objective:   Today's Vitals: BP 132/84   Pulse (!) 56   Temp (!) 97.5 F (36.4 C) (Temporal)   Ht '6\' 2"'  (1.88 m)   Wt 220 lb 9.6 oz (100.1 kg)   SpO2 95%   BMI 28.32 kg/m  Vitals with BMI 07/12/2020 06/28/2020 06/28/2020  Height '6\' 2"'  - '6\' 2"'   Weight 220 lbs 10 oz - 221 lbs 13 oz  BMI 62.94 - 76.54  Systolic 650 354 656  Diastolic 84 98 812  Pulse 56 - 62  Some encounter information is confidential and restricted. Go to Review Flowsheets activity to see all data.     Physical Exam Vitals reviewed.  Constitutional:      Appearance: Normal appearance.  HENT:     Head: Normocephalic and atraumatic.     Right Ear: Hearing, tympanic membrane and ear canal normal.     Left Ear: Hearing and external ear normal. Drainage, swelling and tenderness present.     Ears:     Comments: Unable to view TM due to purulent drainage and swelling in ear canal Cardiovascular:     Rate and Rhythm: Normal rate and regular rhythm.  Pulmonary:     Effort: Pulmonary effort is normal.     Breath sounds: Normal breath sounds.  Musculoskeletal:     Cervical back: Neck supple.     Right knee: Crepitus present. No swelling, effusion or erythema. No tenderness.     Left knee: Crepitus present. No swelling, deformity, effusion or erythema. No tenderness.       Feet:  Skin:    General: Skin is warm and dry.  Neurological:     Mental Status: He is alert and  oriented to person, place, and time.  Psychiatric:        Mood and Affect: Mood normal.  Behavior: Behavior normal.        Thought Content: Thought content normal.        Judgment: Judgment normal.          Assessment and Plan   1. Essential hypertension, benign   2. Acute otitis externa of left ear, unspecified type   3. Acute pain of right knee   4. Abrasion of left ankle, subsequent encounter      Plan: 1.  Blood pressure much better controlled on current regimen.  We will check CMP today he will continue on his losartan as prescribed assuming CMP comes back within normal limits. 2.  It appears he has an external infection of his left ear.  We will treat with eardrops, he was told to take 4 drops every 6-8 hours for the next week. 3.  He definitely has more crepitus to his right knee as compared to his left knee.  Otherwise knee appears to be stable and does not have significant bony tenderness.  I did offer to do an x-ray as a precaution he may consider this over the weekend if symptoms persist.  In the meantime he was encouraged to rest his knee, use ice, consider compression, and use over-the-counter therapies such as ibuprofen, Tylenol, or Voltaren gel as needed.  He was encouraged to let me know if symptoms persist or worsen at which point we may consider referral to orthopedic surgery. 4.  These lesions appear to be healing well.  No signs of infection noted.  For now I have encouraged him to continue using Vaseline on the areas of concern and if it does not appear fully healed within the next couple of months we may consider referral to dermatology, they tell me they understand.   Tests ordered Orders Placed This Encounter  Procedures  . DG Knee 3 Views Right  . CMP with eGFR([facility])      Meds ordered this encounter  Medications  . neomycin-polymyxin-hydrocortisone (CORTISPORIN) OTIC solution    Sig: Place 3 drops into the left ear 4 (four) times daily.     Dispense:  10 mL    Refill:  0    Order Specific Question:   Supervising Provider    Answer:   Doree Albee [[id number]]    Patient to follow-up in 3 months or sooner as needed.  Ailene Ards, NP

## 2020-07-13 LAB — COMPLETE METABOLIC PANEL WITH GFR
AG Ratio: 1.7 (calc) (ref 1.0–2.5)
ALT: 12 U/L (ref 9–46)
AST: 14 U/L (ref 10–35)
Albumin: 4.3 g/dL (ref 3.6–5.1)
Alkaline phosphatase (APISO): 48 U/L (ref 35–144)
BUN: 16 mg/dL (ref 7–25)
CO2: 26 mmol/L (ref 20–32)
Calcium: 9.5 mg/dL (ref 8.6–10.3)
Chloride: 107 mmol/L (ref 98–110)
Creat: 1.02 mg/dL (ref 0.70–1.18)
GFR, Est African American: 85 mL/min/{1.73_m2} (ref >=60)
GFR, Est Non African American: 73 mL/min/{1.73_m2} (ref >=60)
Globulin: 2.6 g/dL (calc) (ref 1.9–3.7)
Glucose, Bld: 104 mg/dL — ABNORMAL HIGH (ref 65–99)
Potassium: 4.2 mmol/L (ref 3.5–5.3)
Sodium: 141 mmol/L (ref 135–146)
Total Bilirubin: 0.6 mg/dL (ref 0.2–1.2)
Total Protein: 6.9 g/dL (ref 6.1–8.1)

## 2020-07-16 ENCOUNTER — Telehealth: Payer: Self-pay | Admitting: Neurology

## 2020-07-16 NOTE — Telephone Encounter (Signed)
Dr. Felecia Shelling will be out on 4/18, spoke with pt to r/s to 4/20, he is going to call back to confirm once he speaks with his wife. If has can't do this day, he can have any time with Dr. Felecia Shelling on 4/29.

## 2020-07-17 ENCOUNTER — Telehealth: Payer: Self-pay | Admitting: Orthopaedic Surgery

## 2020-07-17 NOTE — Telephone Encounter (Signed)
Patient's wife/DPR [name] Balzarini called with question about statement received from [facility] for boot. Discussed and gave phone number to patient; will call to relay insurance information as said "being billed for the full amount"

## 2020-07-17 NOTE — Telephone Encounter (Signed)
Patient called back; states has contacted [place], and it will be now filed with insurance. Thanked [place].

## 2020-07-23 ENCOUNTER — Ambulatory Visit: Payer: Medicare Other | Admitting: Neurology

## 2020-07-25 ENCOUNTER — Ambulatory Visit: Payer: Medicare Other | Admitting: Neurology

## 2020-07-30 ENCOUNTER — Telehealth (INDEPENDENT_AMBULATORY_CARE_PROVIDER_SITE_OTHER): Payer: Self-pay

## 2020-07-30 NOTE — Telephone Encounter (Signed)
Patients wife called and let us know that the patient received a Covid booster on Friday from [facility] on [address] and he had a reaction of extreme fatigue and congestion, and also having extreme sweating and his clothes are soaked in sweat. Patients wife stated that she wants to know if this is normal and what patient can do about the sweating?

## 2020-07-30 NOTE — Telephone Encounter (Signed)
The symptoms can be expected with the COVID booster.  If t symptoms do not improve, in the next 2 days, we will need to see him.  In the meantime, please document the type of COVID-vaccine and what day it was taken.  Thanks.

## 2020-07-30 NOTE — Telephone Encounter (Signed)
Called patient and spoke with wife. I gave her the message and documented the patients vaccine information. Covid booster was given in Left arm at [facility] on [address] on Friday, 07/27/2020. Patient had the symptoms previously listed and also had a slight fever over the weekend and that is better now. Wife verbalized an understanding and will call us if his symptoms do not approve.

## 2020-08-13 NOTE — Progress Notes (Incomplete)
CARDIOLOGY CONSULT NOTE       Patient ID: William Joseph MRN: [id number] DOB/AGE: May 17, 1945 75 y.o.  Admit date: (Not on file) Referring Physician: Anastasio Champion Primary Physician: Doree Albee, MD Primary Cardiologist: New previously Donia Pounds Reason for Consultation: Chest Pain   HPI:  75 y.o. referred by Dr Anastasio Champion for chest pain. History of HTN, HLD, smokes cigars. Has had normal myovue in 2017 and low risk one 11/28/19 with diaphragmatic attenuation no ischemia  Has had reactions to his covid vaccines with malaise , dypsnea and fever. Last of his 4 vaccines was 07/27/20   Has been on Norvasc in past for HTN but didn't tolerate better with ARB  Activity limited by chronic ankle/ knee pain Has seen Dr Luna Glasgow   ***  ROS All other systems reviewed and negative except as noted above  Past Medical History:  Diagnosis Date  . Anxiety   . Essential hypertension, benign   . History of cardiac catheterization    Normal coronaries March 2011  . History of non-Hodgkin's lymphoma 06/26/2009   Qualifier: Diagnosis of  By: Dwaine Gale, CNA, [place]    . Non Hodgkin's lymphoma ([place])   . Nonerosive nonspecific gastritis    EGD - Dr. Laural Golden  . Obsessive-compulsive disorder   . Palpitations   . Sleep apnea     Family History  Problem Relation Age of Onset  . Diabetes Father   . Coronary artery disease Mother   . Hypertension Brother   . Mental illness Maternal Uncle   . Dementia Maternal Grandfather   . Seizures Cousin   . Drug abuse Other   . Seizures Maternal Uncle   . ADD / ADHD Neg Hx   . Alcohol abuse Neg Hx   . Anxiety disorder Neg Hx   . Bipolar disorder Neg Hx   . Depression Neg Hx   . OCD Neg Hx   . Paranoid behavior Neg Hx   . Schizophrenia Neg Hx   . Sexual abuse Neg Hx   . Physical abuse Neg Hx     Social History   Socioeconomic History  . Marital status: Married    Spouse name: Not on file  . Number of children: Not on file  . Years of  education: Not on file  . Highest education level: Not on file  Occupational History  . Not on file  Tobacco Use  . Smoking status: Light Tobacco Smoker    Packs/day: 1.00    Years: 1.00    Pack years: 1.00    Types: Cigars    Start date: 09/09/1964  . Smokeless tobacco: Never Used  . Tobacco comment: Puffs on cigars every so often.   Vaping Use  . Vaping Use: Never used  Substance and Sexual Activity  . Alcohol use: Yes    Alcohol/week: 0.0 standard drinks    Comment: glass of wine occasionally  . Drug use: No  . Sexual activity: Not on file  Other Topics Concern  . Not on file  Social History Narrative  . Not on file   Social Determinants of Health   Financial Resource Strain: Not on file  Food Insecurity: Not on file  Transportation Needs: Not on file  Physical Activity: Not on file  Stress: Not on file  Social Connections: Not on file  Intimate Partner Violence: Not on file    Past Surgical History:  Procedure Laterality Date  . COLONOSCOPY N/A 06/29/2015   Procedure: COLONOSCOPY;  Surgeon: Daneil Dolin,  MD;  Location: [facility];  Service: Endoscopy;  Laterality: N/A;  9:30 Am - moved to 9:15 - office to notify  . ESOPHAGOGASTRODUODENOSCOPY ENDOSCOPY N/A   . EXPLORATORY LAPAROTOMY    . GLAUCOMA SURGERY Bilateral 11/2013   didnt work  . LYMPH NODE BIOPSY    . POLYPECTOMY  06/29/2015   Procedure: POLYPECTOMY;  Surgeon: Daneil Dolin, MD;  Location: [facility];  Service: Endoscopy;;  descending colon polyp removed via cold snare  . Port-a-cath placement    . Tooth implant        Current Outpatient Medications:  .  Aspirin-Caffeine (BC FAST PAIN RELIEF PO), Take 1 packet by mouth daily as needed (pain). Reported on 04/26/2015, Disp: , Rfl:  .  B Complex Vitamins (VITAMIN B COMPLEX PO), Take by mouth., Disp: , Rfl:  .  dutasteride (AVODART) 0.5 MG capsule, TAKE 1 CAPSULE(0.5 MG) BY MOUTH DAILY, Disp: 90 capsule, Rfl: 1 .  fluticasone (FLONASE) 50 MCG/ACT  nasal spray, Place 1 spray into both nostrils daily for 7 days., Disp: 16 g, Rfl: 0 .  losartan (COZAAR) 25 MG tablet, Take 1 tablet (25 mg total) by mouth daily., Disp: 30 tablet, Rfl: 2 .  Multiple Vitamins-Minerals (MULTIVITAMIN GUMMIES ADULT PO), Take 1 capsule by mouth daily., Disp: , Rfl:  .  neomycin-polymyxin-hydrocortisone (CORTISPORIN) OTIC solution, Place 3 drops into the left ear 4 (four) times daily., Disp: 10 mL, Rfl: 0 .  Netarsudil-Latanoprost (ROCKLATAN) 0.02-0.005 % SOLN, Apply 1 drop to eye 1 day or 1 dose. , Disp: , Rfl:  .  sodium chloride (OCEAN) 0.65 % nasal spray, Place 1 spray into the nose as needed. Reported on 05/21/2015, Disp: , Rfl:  .  tadalafil (CIALIS) 5 MG tablet, TAKE 1 TABLET(5 MG) BY MOUTH DAILY, Disp: 90 tablet, Rfl: 0 .  tamsulosin (FLOMAX) 0.4 MG CAPS capsule, TAKE 1 CAPSULE BY MOUTH EVERY DAY, Disp: 30 capsule, Rfl: 3    Physical Exam: There were no vitals taken for this visit.   Affect appropriate Healthy:  appears stated age 42: normal Neck supple with no adenopathy JVP normal no bruits no thyromegaly Lungs clear with no wheezing and good diaphragmatic motion Heart:  S1/S2 no murmur, no rub, gallop or click PMI normal Abdomen: benighn, BS positve, no tenderness, no AAA no bruit.  No HSM or HJR Distal pulses intact with no bruits No edema Neuro non-focal Skin warm and dry No muscular weakness  Labs:   Lab Results  Component Value Date   WBC 5.0 08/04/2019   HGB 13.2 08/04/2019   HCT 40.3 08/04/2019   MCV 91.6 08/04/2019   PLT 186 08/04/2019   No results for input(s): NA, K, CL, CO2, BUN, CREATININE, CALCIUM, PROT, BILITOT, ALKPHOS, ALT, AST, GLUCOSE in the last 168 hours.  Invalid input(s): LABALBU Lab Results  Component Value Date   CKTOTAL 156 06/14/2009   CKMB 1.6 06/14/2009   TROPONINI <0.30 07/28/2012    Lab Results  Component Value Date   CHOL 227 (H) 06/23/2019   CHOL 201 (A) 05/18/2017   Lab Results  Component  Value Date   HDL 42 06/23/2019   HDL 46 05/18/2017   Lab Results  Component Value Date   LDLCALC 162 (H) 06/23/2019   LDLCALC 138 05/18/2017   Lab Results  Component Value Date   TRIG 111 06/23/2019   TRIG 76 05/18/2017   Lab Results  Component Value Date   CHOLHDL 5.4 (H) 06/23/2019   No results  found for: LDLDIRECT    Radiology: No results found.  EKG: SR LVH 06/28/20    ASSESSMENT AND PLAN:   1. Chest Pain:  Atypical with normal myovue 2017 and 2021 *** 2. HTN:  Well controlled.  Continue current medications and low sodium Dash type diet.   3. Smoking: mostly cigars CXR 09/09/19 NAD   ***  Signed: Jenkins Rouge 08/13/2020, 1:30 PM

## 2020-08-21 ENCOUNTER — Ambulatory Visit: Payer: Federal, State, Local not specified - PPO | Admitting: Cardiovascular Disease

## 2020-08-28 ENCOUNTER — Other Ambulatory Visit (INDEPENDENT_AMBULATORY_CARE_PROVIDER_SITE_OTHER): Payer: Self-pay | Admitting: Nurse Practitioner

## 2020-08-28 DIAGNOSIS — I10 Essential (primary) hypertension: Secondary | ICD-10-CM

## 2020-09-19 ENCOUNTER — Encounter: Payer: Self-pay | Admitting: Acute Care

## 2020-09-27 ENCOUNTER — Other Ambulatory Visit (INDEPENDENT_AMBULATORY_CARE_PROVIDER_SITE_OTHER): Payer: Self-pay | Admitting: Nurse Practitioner

## 2020-09-27 DIAGNOSIS — I10 Essential (primary) hypertension: Secondary | ICD-10-CM

## 2020-10-09 ENCOUNTER — Ambulatory Visit (INDEPENDENT_AMBULATORY_CARE_PROVIDER_SITE_OTHER): Payer: Federal, State, Local not specified - PPO | Admitting: Neurology

## 2020-10-09 ENCOUNTER — Encounter: Payer: Self-pay | Admitting: Neurology

## 2020-10-09 VITALS — BP 155/91 | HR 56 | Ht 74.0 in | Wt 216.0 lb

## 2020-10-09 DIAGNOSIS — R208 Other disturbances of skin sensation: Secondary | ICD-10-CM | POA: Diagnosis not present

## 2020-10-09 DIAGNOSIS — S6440XA Injury of digital nerve of unspecified finger, initial encounter: Secondary | ICD-10-CM | POA: Insufficient documentation

## 2020-10-09 MED ORDER — GABAPENTIN 300 MG PO CAPS: 300.0000 mg | ORAL_CAPSULE | Freq: Three times a day (TID) | ORAL | 11 refills | Status: DC

## 2020-10-09 NOTE — Progress Notes (Signed)
[facility]  PATIENT: William Joseph DOB: 03-Jan-1946  REFERRING DOCTOR OR PCP: Hurshel Party MD SOURCE: Patient, notes from primary care.  _________________________________   HISTORICAL  CHIEF COMPLAINT:  Chief Complaint  Patient presents with   New Patient (Initial Visit)    RM 2. Internal referral for right hand numbness. Pt states about a year ago he was bit by something (he thinks spider) states he developed rt hand numbness. He has lost total sensation at top of index finger. He describes the numbness is around index finger to thumb area. The rest of the hand is not numb. No imaging or studies completed.    HISTORY OF PRESENT ILLNESS:  I had the pleasure of seeing your patient, William Joseph, at [facility] for neurologic consultation regarding the numbness in his right hand.  He is a 75 year old man who ha had numbness in the right index finger since a spider bite in mid-2021.  Initially, he had a burning sensation.  The finger was red and swollen.     He received antibiotics.   As the swelling resolved, the numbness and burning/tingling dysesthesia.  He notes that using a mouse is more difficult.    He denies weakness in the hand/fingers.   The numbness is moslty on the radial side of the dorsal right second finger and the fingertip.   The ulnar side of the finger is fine.     He has no numbness or dysesthesias elsewhere.     REVIEW OF SYSTEMS: Constitutional: No fevers, chills, sweats, or change in appetite Eyes: No visual changes, double vision, eye pain Ear, nose and throat: No hearing loss, ear pain, nasal congestion, sore throat Cardiovascular: No chest pain, palpitations Respiratory:  No shortness of breath at rest or with exertion.   No wheezes GastrointestinaI: No nausea, vomiting, diarrhea, abdominal pain, fecal incontinence Genitourinary:  No dysuria.  No nocturia.  Has BPH. Musculoskeletal:  No neck pain, back  pain Integumentary: No rash, pruritus, skin lesions Neurological: as above Psychiatric: No depression at this time.  No anxiety Endocrine: No palpitations, diaphoresis, change in appetite, change in weigh or increased thirst Hematologic/Lymphatic:  No anemia, purpura, petechiae. Allergic/Immunologic: He has seasonal allergies.  ALLERGIES: No Known Allergies  HOME MEDICATIONS:  Current Outpatient Medications:    Aspirin-Caffeine (BC FAST PAIN RELIEF PO), Take 1 packet by mouth daily as needed (pain). Reported on 04/26/2015, Disp: , Rfl:    B Complex Vitamins (VITAMIN B COMPLEX PO), Take by mouth., Disp: , Rfl:    dutasteride (AVODART) 0.5 MG capsule, TAKE 1 CAPSULE(0.5 MG) BY MOUTH DAILY, Disp: 90 capsule, Rfl: 1   fluticasone (FLONASE) 50 MCG/ACT nasal spray, Place 1 spray into both nostrils daily as needed for allergies or rhinitis., Disp: , Rfl:    gabapentin (NEURONTIN) 300 MG capsule, Take 1 capsule (300 mg total) by mouth 3 (three) times daily., Disp: 90 capsule, Rfl: 11   losartan (COZAAR) 25 MG tablet, TAKE 1 TABLET(25 MG) BY MOUTH DAILY, Disp: 30 tablet, Rfl: 2   Multiple Vitamins-Minerals (MULTIVITAMIN GUMMIES ADULT PO), Take 1 capsule by mouth daily., Disp: , Rfl:    Netarsudil-Latanoprost (ROCKLATAN) 0.02-0.005 % SOLN, Apply 1 drop to eye 1 day or 1 dose. , Disp: , Rfl:    sodium chloride (OCEAN) 0.65 % nasal spray, Place 1 spray into the nose as needed. Reported on 05/21/2015, Disp: , Rfl:    tadalafil (CIALIS) 5 MG tablet, TAKE 1 TABLET(5 MG) BY MOUTH DAILY,  Disp: 90 tablet, Rfl: 0   tamsulosin (FLOMAX) 0.4 MG CAPS capsule, TAKE 1 CAPSULE BY MOUTH EVERY DAY, Disp: 30 capsule, Rfl: 3  PAST MEDICAL HISTORY: Past Medical History:  Diagnosis Date   Anxiety    Essential hypertension, benign    History of cardiac catheterization    Normal coronaries March 2011   History of non-Hodgkin's lymphoma 06/26/2009   Qualifier: Diagnosis of  By: Dwaine Gale, CNA, Sandy     Non Hodgkin's  lymphoma ([place])    Nonerosive nonspecific gastritis    EGD - Dr. Laural Golden   Obsessive-compulsive disorder    Palpitations    Sleep apnea     PAST SURGICAL HISTORY: Past Surgical History:  Procedure Laterality Date   COLONOSCOPY N/A 06/29/2015   Procedure: COLONOSCOPY;  Surgeon: Daneil Dolin, MD;  [facility];  Service: Endoscopy;  Laterality: N/A;  9:30 Am - moved to 9:15 - office to notify   ESOPHAGOGASTRODUODENOSCOPY ENDOSCOPY N/A    EXPLORATORY LAPAROTOMY     GLAUCOMA SURGERY Bilateral 11/2013   didnt work   LYMPH NODE BIOPSY     POLYPECTOMY  06/29/2015   Procedure: POLYPECTOMY;  Surgeon: Daneil Dolin, MD;  [facility];  Service: Endoscopy;;  descending colon polyp removed via cold snare   Port-a-cath placement     Tooth implant      FAMILY HISTORY: Family History  Problem Relation Age of Onset   Diabetes Father    Coronary artery disease Mother    Hypertension Brother    Mental illness Maternal Uncle    Dementia Maternal Grandfather    Seizures Cousin    Drug abuse Other    Seizures Maternal Uncle    ADD / ADHD Neg Hx    Alcohol abuse Neg Hx    Anxiety disorder Neg Hx    Bipolar disorder Neg Hx    Depression Neg Hx    OCD Neg Hx    Paranoid behavior Neg Hx    Schizophrenia Neg Hx    Sexual abuse Neg Hx    Physical abuse Neg Hx     SOCIAL HISTORY:  Social History   Socioeconomic History   Marital status: Married    Spouse name: Not on file   Number of children: Not on file   Years of education: Not on file   Highest education level: Not on file  Occupational History   Not on file  Tobacco Use   Smoking status: Light Smoker    Packs/day: 1.00    Years: 1.00    Pack years: 1.00    Types: Cigars, Cigarettes    Start date: 09/09/1964   Smokeless tobacco: Never   Tobacco comments:    Puffs on cigars every so often.   Vaping Use   Vaping Use: Never used  Substance and Sexual Activity   Alcohol use: Yes    Alcohol/week: 0.0  standard drinks    Comment: glass of wine occasionally   Drug use: No   Sexual activity: Not on file  Other Topics Concern   Not on file  Social History Narrative   Not on file   Social Determinants of Health   Financial Resource Strain: Not on file  Food Insecurity: Not on file  Transportation Needs: Not on file  Physical Activity: Not on file  Stress: Not on file  Social Connections: Not on file  Intimate Partner Violence: Not on file     PHYSICAL EXAM  Vitals:   10/09/20  1114  BP: (!) 155/91  Pulse: (!) 56  Weight: 216 lb (98 kg)  Height: 6\' 2"  (1.88 m)    Body mass index is 27.73 kg/m.   General: The patient is well-developed and well-nourished and in no acute distress  HEENT:  Head is [place]/AT.  Sclera are anicteric.  Funduscopic exam shows normal optic discs and retinal vessels.  Neck: No carotid bruits are noted.  The neck is nontender.  Cardiovascular: The heart has a regular rate and rhythm with a normal S1 and S2. There were no murmurs, gallops or rubs.    Skin: Extremities are without rash or  edema.  Neurologic Exam  Mental status: The patient is alert and oriented x 3 at the time of the examination. The patient has apparent normal recent and remote memory, with an apparently normal attention span and concentration ability.   Speech is normal.  Cranial nerves: Extraocular movements are full.  Facial strength and sensation was normal.  No obvious hearing deficits are noted.  Motor:  Muscle bulk is normal.   Tone is normal. Strength is  5 / 5 in all 4 extremities.   Sensory: He had reduced sensation in the dorsal radial quadrant of the second finger and fingertip on the right.  Sensation was normal elsewhere in the hands.  Coordination: Cerebellar testing reveals good finger-nose-finger and heel-to-shin bilaterally.  Gait and station: Station is normal.   Gait is normal. Tandem gait is normal. Romberg is negative.     DIAGNOSTIC DATA (LABS, IMAGING,  TESTING) - I reviewed patient records, labs, notes, testing and imaging myself where available.  Lab Results  Component Value Date   WBC 5.0 08/04/2019   HGB 13.2 08/04/2019   HCT 40.3 08/04/2019   MCV 91.6 08/04/2019   PLT 186 08/04/2019      Component Value Date/Time   NA 141 07/12/2020 0818   NA 141 05/18/2017 0000   K 4.2 07/12/2020 0818   CL 107 07/12/2020 0818   CO2 26 07/12/2020 0818   GLUCOSE 104 (H) 07/12/2020 0818   BUN 16 07/12/2020 0818   BUN 13 05/18/2017 0000   CREATININE 1.02 07/12/2020 0818   CALCIUM 9.5 07/12/2020 0818   PROT 6.9 07/12/2020 0818   ALBUMIN 4.3 05/18/2017 0000   AST 14 07/12/2020 0818   ALT 12 07/12/2020 0818   ALKPHOS 52 04/26/2015 0935   BILITOT 0.6 07/12/2020 0818   GFRNONAA 73 07/12/2020 0818   GFRAA 85 07/12/2020 0818   Lab Results  Component Value Date   CHOL 227 (H) 06/23/2019   HDL 42 06/23/2019   LDLCALC 162 (H) 06/23/2019   TRIG 111 06/23/2019   CHOLHDL 5.4 (H) 06/23/2019   Lab Results  Component Value Date   HGBA1C 5.9 (H) 06/23/2019   No results found for: VITAMINB12 Lab Results  Component Value Date   TSH 0.92 06/23/2019       ASSESSMENT AND PLAN  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 Mcferran is a 75 year old man with numbness in the right index finger that began after a bite of some sort that was associated with inflammation.  The distribution of his numbness and dysesthesias is consistent with injury to the dorsal branch of the digital nerve going into the index finger.  Hopefully this will slowly improved.  I will place him on gabapentin 300 mg to titrate up to 3 times a day.  If pain improves, consider stopping after  an additional 6 months to see if the medication is still requiring.  If he does not get a benefit from gabapentin we could also try lamotrigine titrating to 50 mg twice daily or other medications.  He will return to see [place] in about 4 months or sooner  if there are new or worsening neurologic symptoms.  Thank you for asking me to see this patient.  Please let me know if I can be of further assistance with him or other patients in the future.   Durward Matranga A. Felecia Shelling, MD, [facility] 10/06/917, 80:22 PM Certified in Neurology, Clinical Neurophysiology, Sleep Medicine and Neuroimaging  [facility] [address], [place], [place] [address] [phone]

## 2020-10-15 ENCOUNTER — Telehealth: Payer: Self-pay | Admitting: Acute Care

## 2020-10-15 NOTE — Telephone Encounter (Signed)
Attempted to call. Unable to leave message. Will call back.

## 2020-10-15 NOTE — Telephone Encounter (Signed)
Pts wife is calling to get the pt scheduled for CT scan for lung cancer screening program. Pls regard; [phone] or [phone].  Will route to Nurse Langley Gauss and Lung Cancer Screening pool.

## 2020-10-16 ENCOUNTER — Other Ambulatory Visit: Payer: Self-pay

## 2020-10-16 ENCOUNTER — Encounter (INDEPENDENT_AMBULATORY_CARE_PROVIDER_SITE_OTHER): Payer: Self-pay | Admitting: Internal Medicine

## 2020-10-16 ENCOUNTER — Other Ambulatory Visit (INDEPENDENT_AMBULATORY_CARE_PROVIDER_SITE_OTHER): Payer: Self-pay | Admitting: Internal Medicine

## 2020-10-16 ENCOUNTER — Ambulatory Visit (INDEPENDENT_AMBULATORY_CARE_PROVIDER_SITE_OTHER): Payer: Federal, State, Local not specified - PPO | Admitting: Internal Medicine

## 2020-10-16 VITALS — BP 132/80 | HR 81 | Temp 97.5°F | Resp 18 | Ht 74.0 in | Wt 215.6 lb

## 2020-10-16 DIAGNOSIS — R7303 Prediabetes: Secondary | ICD-10-CM | POA: Diagnosis not present

## 2020-10-16 DIAGNOSIS — I10 Essential (primary) hypertension: Secondary | ICD-10-CM | POA: Diagnosis not present

## 2020-10-16 DIAGNOSIS — D649 Anemia, unspecified: Secondary | ICD-10-CM | POA: Diagnosis not present

## 2020-10-16 DIAGNOSIS — E785 Hyperlipidemia, unspecified: Secondary | ICD-10-CM

## 2020-10-16 DIAGNOSIS — R972 Elevated prostate specific antigen [PSA]: Secondary | ICD-10-CM | POA: Diagnosis not present

## 2020-10-16 NOTE — Progress Notes (Signed)
Metrics: Intervention Frequency ACO  Documented Smoking Status Yearly  Screened one or more times in 24 months  Cessation Counseling or  Active cessation medication Past 24 months  Past 24 months   Guideline developer: UpToDate (See UpToDate for funding source) Date Released: 2014       Wellness Office Visit  Subjective:  Patient ID: William Joseph, male    DOB: 1945/10/28  Age: 75 y.o. MRN: [id number]  CC: This delightful man comes in for follow-up of hypertension, prediabetes, hyperlipidemia, elevated PSA. HPI  Is doing reasonably well.  He continues with losartan for his hypertension.  Renal function has been normal previously. He was having some paresthesia/numbness in the right hand.  He has seen neurology and was started on gabapentin.  He is due to follow-up in about 4 months. Past Medical History:  Diagnosis Date   Anxiety    Essential hypertension, benign    History of cardiac catheterization    Normal coronaries March 2011   History of non-Hodgkin's lymphoma 06/26/2009   Qualifier: Diagnosis of  By: Dwaine Gale, CNA, Sandy     Non Hodgkin's lymphoma ([place])    Nonerosive nonspecific gastritis    EGD - Dr. Laural Golden   Obsessive-compulsive disorder    Palpitations    Sleep apnea    Past Surgical History:  Procedure Laterality Date   COLONOSCOPY N/A 06/29/2015   Procedure: COLONOSCOPY;  Surgeon: Daneil Dolin, MD;  [facility];  Service: Endoscopy;  Laterality: N/A;  9:30 Am - moved to 9:15 - office to notify   ESOPHAGOGASTRODUODENOSCOPY ENDOSCOPY N/A    EXPLORATORY LAPAROTOMY     GLAUCOMA SURGERY Bilateral 11/2013   didnt work   LYMPH NODE BIOPSY     POLYPECTOMY  06/29/2015   Procedure: POLYPECTOMY;  Surgeon: Daneil Dolin, MD;  [facility];  Service: Endoscopy;;  descending colon polyp removed via cold snare   Port-a-cath placement     Tooth implant       Family History  Problem Relation Age of Onset   Diabetes Father    Coronary artery  disease Mother    Hypertension Brother    Mental illness Maternal Uncle    Dementia Maternal Grandfather    Seizures Cousin    Drug abuse Other    Seizures Maternal Uncle    ADD / ADHD Neg Hx    Alcohol abuse Neg Hx    Anxiety disorder Neg Hx    Bipolar disorder Neg Hx    Depression Neg Hx    OCD Neg Hx    Paranoid behavior Neg Hx    Schizophrenia Neg Hx    Sexual abuse Neg Hx    Physical abuse Neg Hx     Social History   Social History Narrative   Not on file   Social History   Tobacco Use   Smoking status: Light Smoker    Packs/day: 1.00    Years: 1.00    Pack years: 1.00    Types: Cigars, Cigarettes    Start date: 09/09/1964   Smokeless tobacco: Never   Tobacco comments:    Puffs on cigars every so often.   Substance Use Topics   Alcohol use: Yes    Alcohol/week: 0.0 standard drinks    Comment: glass of wine occasionally    Current Meds  Medication Sig   Aspirin-Caffeine (BC FAST PAIN RELIEF PO) Take 1 packet by mouth daily as needed (pain). Reported on 04/26/2015   B Complex Vitamins (VITAMIN  B COMPLEX PO) Take by mouth.   dutasteride (AVODART) 0.5 MG capsule TAKE 1 CAPSULE(0.5 MG) BY MOUTH DAILY   fluticasone (FLONASE) 50 MCG/ACT nasal spray Place 1 spray into both nostrils daily as needed for allergies or rhinitis.   gabapentin (NEURONTIN) 300 MG capsule Take 1 capsule (300 mg total) by mouth 3 (three) times daily.   losartan (COZAAR) 25 MG tablet TAKE 1 TABLET(25 MG) BY MOUTH DAILY   Multiple Vitamins-Minerals (MULTIVITAMIN GUMMIES ADULT PO) Take 1 capsule by mouth daily.   Netarsudil-Latanoprost (ROCKLATAN) 0.02-0.005 % SOLN Apply 1 drop to eye 1 day or 1 dose.    sodium chloride (OCEAN) 0.65 % nasal spray Place 1 spray into the nose as needed. Reported on 05/21/2015   tadalafil (CIALIS) 5 MG tablet TAKE 1 TABLET(5 MG) BY MOUTH DAILY   tamsulosin (FLOMAX) 0.4 MG CAPS capsule TAKE 1 CAPSULE BY MOUTH EVERY DAY     Flowsheet Row Office Visit from 02/20/2020  in [facility]  PHQ-9 Total Score 7       Objective:   Today's Vitals: BP 132/80 (BP Location: Right Arm, Patient Position: Sitting, Cuff Size: Large)   Pulse 81   Temp (!) 97.5 F (36.4 C) (Temporal)   Resp 18   Ht 6\' 2"  (1.88 m)   Wt 215 lb 9.6 oz (97.8 kg)   SpO2 98%   BMI 27.68 kg/m  Vitals with BMI 10/16/2020 10/09/2020 07/12/2020  Height 6\' 2"  6\' 2"  6\' 2"   Weight 215 lbs 10 oz 216 lbs 220 lbs 10 oz  BMI 27.67 77.41 28.78  Systolic 676 720 947  Diastolic 80 91 84  Pulse 81 56 56  Some encounter information is confidential and restricted. Go to Review Flowsheets activity to see all data.     Physical Exam  He looks systemically well for his age.  Blood pressure is well controlled.  He is alert and orientated without any focal logical signs.     Assessment   1. Essential hypertension, benign   2. Prediabetes   3. Anemia, unspecified type   4. Elevated PSA   5. Hyperlipidemia, unspecified hyperlipidemia type       Tests ordered Orders Placed This Encounter  Procedures   COMPLETE METABOLIC PANEL WITH GFR   Hemoglobin A1c   Lipid panel   PSA, Total with Reflex to PSA, Free   CBC      Plan: 1.  Continue with losartan which is controlling his blood pressure.  Check renal function. 2.  Check hemoglobin A1c as he was prediabetic previously but this has not been followed up. 3.  Check lipid panel also. 4.  He had elevated PSA previously and we will repeat this now. 5.  He was anemic previously and we will check it again just to make sure this is not an issue anymore. 6.  Follow-up with [name] in about 3 months.    No orders of the defined types were placed in this encounter.   Doree Albee, MD

## 2020-10-17 ENCOUNTER — Other Ambulatory Visit (INDEPENDENT_AMBULATORY_CARE_PROVIDER_SITE_OTHER): Payer: Self-pay | Admitting: Internal Medicine

## 2020-10-17 ENCOUNTER — Telehealth (INDEPENDENT_AMBULATORY_CARE_PROVIDER_SITE_OTHER): Payer: Self-pay

## 2020-10-17 DIAGNOSIS — R972 Elevated prostate specific antigen [PSA]: Secondary | ICD-10-CM

## 2020-10-17 LAB — COMPLETE METABOLIC PANEL WITH GFR
AG Ratio: 1.7 (calc) (ref 1.0–2.5)
ALT: 12 U/L (ref 9–46)
AST: 14 U/L (ref 10–35)
Albumin: 4.2 g/dL (ref 3.6–5.1)
Alkaline phosphatase (APISO): 50 U/L (ref 35–144)
BUN: 13 mg/dL (ref 7–25)
CO2: 27 mmol/L (ref 20–32)
Calcium: 9.7 mg/dL (ref 8.6–10.3)
Chloride: 106 mmol/L (ref 98–110)
Creat: 1.02 mg/dL (ref 0.70–1.28)
Globulin: 2.5 g/dL (calc) (ref 1.9–3.7)
Glucose, Bld: 94 mg/dL (ref 65–99)
Potassium: 4.4 mmol/L (ref 3.5–5.3)
Sodium: 141 mmol/L (ref 135–146)
Total Bilirubin: 0.5 mg/dL (ref 0.2–1.2)
Total Protein: 6.7 g/dL (ref 6.1–8.1)
eGFR: 78 mL/min/{1.73_m2} (ref >=60)

## 2020-10-17 LAB — CBC
HCT: 40.4 % (ref 38.5–50.0)
Hemoglobin: 13.3 g/dL (ref 13.2–17.1)
MCH: 30 pg (ref 27.0–33.0)
MCHC: 32.9 g/dL (ref 32.0–36.0)
MCV: 91.2 fL (ref 80.0–100.0)
MPV: 11.8 fL (ref 7.5–12.5)
Platelets: 163 10*3/uL (ref 140–400)
RBC: 4.43 10*6/uL (ref 4.20–5.80)
RDW: 12.8 % (ref 11.0–15.0)
WBC: 5.1 10*3/uL (ref 3.8–10.8)

## 2020-10-17 LAB — LIPID PANEL
Cholesterol: 236 mg/dL — ABNORMAL HIGH (ref <200)
HDL: 49 mg/dL (ref >=40)
LDL Cholesterol (Calc): 170 mg/dL (calc) — ABNORMAL HIGH
Non-HDL Cholesterol (Calc): 187 mg/dL (calc) — ABNORMAL HIGH (ref <130)
Total CHOL/HDL Ratio: 4.8 (calc) (ref <5.0)
Triglycerides: 72 mg/dL (ref <150)

## 2020-10-17 LAB — PSA, TOTAL WITH REFLEX TO PSA, FREE: PSA, Total: 8.3 ng/mL — ABNORMAL HIGH (ref <=4.0)

## 2020-10-17 LAB — REFLEX PSA, FREE
PSA, % Free: 5 % (calc) — ABNORMAL LOW (ref >25)
PSA, Free: 0.4 ng/mL

## 2020-10-17 LAB — HEMOGLOBIN A1C
Hgb A1c MFr Bld: 5.7 % of total Hgb — ABNORMAL HIGH (ref <5.7)
Mean Plasma Glucose: 117 mg/dL
eAG (mmol/L): 6.5 mmol/L

## 2020-10-17 NOTE — Telephone Encounter (Signed)
-----   Message from Doree Albee, MD sent at 10/17/2020 11:41 AM EDT ----- Please call this patient, or his wife.  His PSA is elevated and his percentage free PSA indicates that he actually may have prostate cancer.  We need to refer him to urology and I will put the order in now.  Please get him referred to soon as possible.  Thanks.

## 2020-10-17 NOTE — Telephone Encounter (Signed)
Attempted to call both home and  mobile numbers. Unable to leave message. Will call back.

## 2020-10-17 NOTE — Progress Notes (Signed)
Please call this patient, or his wife.  His PSA is elevated and his percentage free PSA indicates that he actually may have prostate cancer.  We need to refer him to urology and I will put the order in now.  Please get him referred to soon as possible.  Thanks.

## 2020-10-17 NOTE — Telephone Encounter (Signed)
Called wife , [name] and given PSA lab results & instructions on spouse results. His PSA is elevated and his percentage free PSA indicates that he actually may have prostate cancer.  We need to refer him to urology and I will put the orderin now.  Please get him referred to soon as possible.  She understands , ask to make sure they call her and get him in asap.  She is praying it will not be too bad where can get it quickly to resolve .

## 2020-10-22 NOTE — Telephone Encounter (Signed)
Spoke with pt and clarified that it has been 16 years since he quit smoking. I explained that he no longer qualifies for lung cancer screening. Pt verbalized understanding and will f/u with his PCP regarding any further f/u needed,

## 2020-10-29 ENCOUNTER — Telehealth (INDEPENDENT_AMBULATORY_CARE_PROVIDER_SITE_OTHER): Payer: Self-pay

## 2020-10-29 NOTE — Telephone Encounter (Signed)
CONTACTED WIFE OF REFERRAL FROM PULMONARY TO CONTACT TO SET HIS APPT'S.

## 2020-11-01 ENCOUNTER — Ambulatory Visit
Admission: EM | Admit: 2020-11-01 | Discharge: 2020-11-01 | Disposition: A | Discharge status: Home / Self Care | Payer: Federal, State, Local not specified - PPO | Attending: Emergency Medicine | Admitting: Emergency Medicine

## 2020-11-01 ENCOUNTER — Encounter: Payer: Self-pay | Admitting: Emergency Medicine

## 2020-11-01 ENCOUNTER — Other Ambulatory Visit: Payer: Self-pay

## 2020-11-01 DIAGNOSIS — R6889 Other general symptoms and signs: Secondary | ICD-10-CM

## 2020-11-01 DIAGNOSIS — Z20822 Contact with and (suspected) exposure to covid-19: Secondary | ICD-10-CM | POA: Diagnosis not present

## 2020-11-01 MED ORDER — BENZONATATE 100 MG PO CAPS: 100.0000 mg | ORAL_CAPSULE | Freq: Three times a day (TID) | ORAL | 0 refills | Status: DC

## 2020-11-01 MED ORDER — KETOROLAC TROMETHAMINE 30 MG/ML IJ SOLN
30.0000 mg | Freq: Once | INTRAMUSCULAR | Status: AC
  Administered 2020-11-01: 30 mg via INTRAMUSCULAR

## 2020-11-01 MED ORDER — DEXAMETHASONE SODIUM PHOSPHATE 10 MG/ML IJ SOLN
10.0000 mg | Freq: Once | INTRAMUSCULAR | Status: AC
  Administered 2020-11-01: 10 mg via INTRAMUSCULAR

## 2020-11-01 NOTE — Discharge Instructions (Addendum)

## 2020-11-01 NOTE — ED Triage Notes (Signed)
Sore throat, body aches, and headache since last night, chills, feels fatigued.

## 2020-11-01 NOTE — ED Provider Notes (Signed)
[facility]   [id number] 11/01/20 Arrival Time: [id number]   CC: COVID symptoms  SUBJECTIVE: History from: patient.  William Joseph is a 75 y.o. male who presents with sore throat, body aches, chills, fatigue, and headache that began last night.  Denies sick exposure to COVID, flu or strep.  Has tried OTC medications without relief.  Denies aggravating factors. Denies previous symptoms in the past.   Denies fever, SOB, wheezing, chest pain, nausea, changes in bowel or bladder habits.    ROS: As per HPI.  All other pertinent ROS negative.     Past Medical History:  Diagnosis Date   Anxiety    Essential hypertension, benign    History of cardiac catheterization    Normal coronaries March 2011   History of non-Hodgkin's lymphoma 06/26/2009   Qualifier: Diagnosis of  By: Dwaine Gale, CNA, Sandy     Non Hodgkin's lymphoma ([place])    Nonerosive nonspecific gastritis    EGD - Dr. Laural Golden   Obsessive-compulsive disorder    Palpitations    Sleep apnea    Past Surgical History:  Procedure Laterality Date   COLONOSCOPY N/A 06/29/2015   Procedure: COLONOSCOPY;  Surgeon: Daneil Dolin, MD;  [facility];  Service: Endoscopy;  Laterality: N/A;  9:30 Am - moved to 9:15 - office to notify   ESOPHAGOGASTRODUODENOSCOPY ENDOSCOPY N/A    EXPLORATORY LAPAROTOMY     GLAUCOMA SURGERY Bilateral 11/2013   didnt work   LYMPH NODE BIOPSY     POLYPECTOMY  06/29/2015   Procedure: POLYPECTOMY;  Surgeon: Daneil Dolin, MD;  [facility];  Service: Endoscopy;;  descending colon polyp removed via cold snare   Port-a-cath placement     Tooth implant     No Known Allergies No current facility-administered medications on file prior to encounter.   Current Outpatient Medications on File Prior to Encounter  Medication Sig Dispense Refill   Aspirin-Caffeine (BC FAST PAIN RELIEF PO) Take 1 packet by mouth daily as needed (pain). Reported on 04/26/2015     B Complex Vitamins (VITAMIN  B COMPLEX PO) Take by mouth.     dutasteride (AVODART) 0.5 MG capsule TAKE 1 CAPSULE(0.5 MG) BY MOUTH DAILY 90 capsule 1   fluticasone (FLONASE) 50 MCG/ACT nasal spray Place 1 spray into both nostrils daily as needed for allergies or rhinitis.     gabapentin (NEURONTIN) 300 MG capsule Take 1 capsule (300 mg total) by mouth 3 (three) times daily. 90 capsule 11   losartan (COZAAR) 25 MG tablet TAKE 1 TABLET(25 MG) BY MOUTH DAILY 30 tablet 2   Multiple Vitamins-Minerals (MULTIVITAMIN GUMMIES ADULT PO) Take 1 capsule by mouth daily.     Netarsudil-Latanoprost (ROCKLATAN) 0.02-0.005 % SOLN Apply 1 drop to eye 1 day or 1 dose.      sodium chloride (OCEAN) 0.65 % nasal spray Place 1 spray into the nose as needed. Reported on 05/21/2015     tadalafil (CIALIS) 5 MG tablet TAKE 1 TABLET(5 MG) BY MOUTH DAILY 90 tablet 0   tamsulosin (FLOMAX) 0.4 MG CAPS capsule TAKE 1 CAPSULE BY MOUTH EVERY DAY 30 capsule 3   Social History   Socioeconomic History   Marital status: Married    Spouse name: Not on file   Number of children: Not on file   Years of education: Not on file   Highest education level: Not on file  Occupational History   Not on file  Tobacco Use   Smoking status: Light Smoker  Packs/day: 1.00    Years: 1.00    Pack years: 1.00    Types: Cigars, Cigarettes    Start date: 09/09/1964   Smokeless tobacco: Never   Tobacco comments:    Puffs on cigars every so often.   Vaping Use   Vaping Use: Never used  Substance and Sexual Activity   Alcohol use: Yes    Alcohol/week: 0.0 standard drinks    Comment: glass of wine occasionally   Drug use: No   Sexual activity: Not on file  Other Topics Concern   Not on file  Social History Narrative   Not on file   Social Determinants of Health   Financial Resource Strain: Not on file  Food Insecurity: Not on file  Transportation Needs: Not on file  Physical Activity: Not on file  Stress: Not on file  Social Connections: Not on file   Intimate Partner Violence: Not on file   Family History  Problem Relation Age of Onset   Diabetes Father    Coronary artery disease Mother    Hypertension Brother    Mental illness Maternal Uncle    Dementia Maternal Grandfather    Seizures Cousin    Drug abuse Other    Seizures Maternal Uncle    ADD / ADHD Neg Hx    Alcohol abuse Neg Hx    Anxiety disorder Neg Hx    Bipolar disorder Neg Hx    Depression Neg Hx    OCD Neg Hx    Paranoid behavior Neg Hx    Schizophrenia Neg Hx    Sexual abuse Neg Hx    Physical abuse Neg Hx     OBJECTIVE:  Vitals:   11/01/20 1448  BP: 132/84  Pulse: 79  Resp: 16  Temp: 98.8 F (37.1 C)  TempSrc: Oral  SpO2: 96%     General appearance: alert; appears fatigued, but nontoxic; speaking in full sentences and tolerating own secretions HEENT: NCAT; Ears: EACs clear, TMs pearly gray; Eyes: PERRL.  EOM grossly intact. Nose: nares patent without rhinorrhea, Throat: oropharynx clear, tonsils non erythematous or enlarged, uvula midline  Neck: supple without LAD Lungs: unlabored respirations, symmetrical air entry; cough: absent; no respiratory distress; CTAB Heart: regular rate and rhythm.   Skin: warm and dry Psychological: alert and cooperative; normal mood and affect   ASSESSMENT & PLAN:  1. Exposure to COVID-19 virus   2. Flu-like symptoms     Meds ordered this encounter  Medications   benzonatate (TESSALON) 100 MG capsule    Sig: Take 1 capsule (100 mg total) by mouth every 8 (eight) hours.    Dispense:  21 capsule    Refill:  0    Order Specific Question:   Supervising Provider    Answer:   Raylene Everts [id number]   ketorolac (TORADOL) 30 MG/ML injection 30 mg   dexamethasone (DECADRON) injection 10 mg   Toradol and steroid shot given in office COVID testing ordered.  It will take between 5-7 days for test results.  Someone will contact you regarding abnormal results.    In the meantime: You should remain isolated in  your home for 10 days from symptom onset AND greater than 72 hours after symptoms resolution (absence of fever without the use of fever-reducing medication and improvement in respiratory symptoms), whichever is longer Get plenty of rest and push fluids Tessalon Perles prescribed for cough Use OTC zyrtec for nasal congestion, runny nose, and/or sore throat Use OTC flonase for nasal  congestion and runny nose Use medications daily for symptom relief Use OTC medications like ibuprofen or tylenol as needed fever or pain Call or go to the ED if you have any new or worsening symptoms such as fever, worsening cough, shortness of breath, chest tightness, chest pain, turning blue, changes in mental status, etc...   Reviewed expectations re: course of current medical issues. Questions answered. Outlined signs and symptoms indicating need for more acute intervention. Patient verbalized understanding. After Visit Summary given.          Lestine Box, PA-C 11/01/20 1534

## 2020-11-02 LAB — COVID-19, FLU A+B NAA
Influenza A, NAA: NOT DETECTED
Influenza B, NAA: NOT DETECTED
SARS-CoV-2, NAA: DETECTED — AB

## 2020-11-05 ENCOUNTER — Telehealth (INDEPENDENT_AMBULATORY_CARE_PROVIDER_SITE_OTHER): Payer: Self-pay

## 2020-11-06 ENCOUNTER — Encounter (INDEPENDENT_AMBULATORY_CARE_PROVIDER_SITE_OTHER): Payer: Self-pay

## 2020-11-06 NOTE — Telephone Encounter (Signed)
Pt wife stated he did go to the Urgent care. Was given shots.  Tussilon perls, she spelled this medicine Benzonatate 1 cap Q 8 hr, Tyl & Advil OTC alternate. But pt is still having pain in his head. Wanted to know could he have something for this as well?

## 2020-11-12 ENCOUNTER — Other Ambulatory Visit: Payer: Self-pay

## 2020-11-12 ENCOUNTER — Encounter: Payer: Self-pay | Admitting: Emergency Medicine

## 2020-11-12 ENCOUNTER — Ambulatory Visit
Admission: EM | Admit: 2020-11-12 | Discharge: 2020-11-12 | Disposition: A | Discharge status: Home / Self Care | Payer: Federal, State, Local not specified - PPO | Attending: Family Medicine | Admitting: Family Medicine

## 2020-11-12 DIAGNOSIS — J0140 Acute pansinusitis, unspecified: Secondary | ICD-10-CM | POA: Diagnosis not present

## 2020-11-12 DIAGNOSIS — J209 Acute bronchitis, unspecified: Secondary | ICD-10-CM

## 2020-11-12 DIAGNOSIS — I10 Essential (primary) hypertension: Secondary | ICD-10-CM

## 2020-11-12 MED ORDER — DOXYCYCLINE HYCLATE 100 MG PO CAPS: 100.0000 mg | ORAL_CAPSULE | Freq: Two times a day (BID) | ORAL | 0 refills | Status: DC

## 2020-11-12 MED ORDER — PREDNISONE 20 MG PO TABS: 40.0000 mg | ORAL_TABLET | Freq: Every day | ORAL | 0 refills | Status: DC

## 2020-11-12 NOTE — ED Triage Notes (Signed)
Pt is present today for a Covid test.  Pt states that he tested positive two weeks ago and the only sx he is experiencing is sore throat and HA.

## 2020-11-12 NOTE — ED Provider Notes (Signed)
[facility]    CSN: [id number] Arrival date & time: 11/12/20  0947      History   Chief Complaint Chief Complaint  Patient presents with   Sore Throat   Headache    HPI William Joseph is a 75 y.o. male.   HPI Patient presents for evaluation of persistent cough, sore throat, nasal congestion and drainage, facial pressure (sinus),  following testing positive for COVID 19 two weeks ago. He reports symptoms improved and over the last few days symptom reoccurred and unresponsive to over the counter medication. He has remained negative of fever. Denies chest pain or SOB.   Past Medical History:  Diagnosis Date   Anxiety    Essential hypertension, benign    History of cardiac catheterization    Normal coronaries March 2011   History of non-Hodgkin's lymphoma 06/26/2009   Qualifier: Diagnosis of  By: Dwaine Gale, CNA, Sandy     Non Hodgkin's lymphoma ([place])    Nonerosive nonspecific gastritis    EGD - Dr. 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 10/09/2020   Dysesthesia 10/09/2020   Hyperlipidemia 08/04/2019   Anemia 08/04/2019   Prediabetes 08/04/2019   BPH (benign prostatic hyperplasia) 06/23/2019   History of colonic polyps    Weakness of both legs 11/22/2013   Unstable balance 11/22/2013   Dysthymia 06/17/2012   Shortness of breath 12/17/2011   Palpitations 12/17/2011   History of non-Hodgkin's lymphoma 06/26/2009   ANXIETY 06/26/2009   Essential hypertension, benign 06/26/2009   SLEEP APNEA 06/26/2009    Past Surgical History:  Procedure Laterality Date   COLONOSCOPY N/A 06/29/2015   Procedure: COLONOSCOPY;  Surgeon: Daneil Dolin, MD;  [facility];  Service: Endoscopy;  Laterality: N/A;  9:30 Am - moved to 9:15 - office to notify   ESOPHAGOGASTRODUODENOSCOPY ENDOSCOPY N/A    EXPLORATORY LAPAROTOMY      GLAUCOMA SURGERY Bilateral 11/2013   didnt work   LYMPH NODE BIOPSY     POLYPECTOMY  06/29/2015   Procedure: POLYPECTOMY;  Surgeon: Daneil Dolin, MD;  [facility];  Service: Endoscopy;;  descending colon polyp removed via cold snare   Port-a-cath placement     Tooth implant         Home Medications    Prior to Admission medications   Medication Sig Start Date End Date Taking? Authorizing Provider  Aspirin-Caffeine (BC FAST PAIN RELIEF PO) Take 1 packet by mouth daily as needed (pain). Reported on 04/26/2015    [provider]  B Complex Vitamins (VITAMIN B COMPLEX PO) Take by mouth.    [provider]  benzonatate (TESSALON) 100 MG capsule Take 1 capsule (100 mg total) by mouth every 8 (eight) hours. 11/01/20   Wurst, [place], PA-C  dutasteride (AVODART) 0.5 MG capsule TAKE 1 CAPSULE(0.5 MG) BY MOUTH DAILY 05/14/20   Hurshel Party C, MD  fluticasone (FLONASE) 50 MCG/ACT nasal spray Place 1 spray into both nostrils daily as needed for allergies or rhinitis.    [provider]  gabapentin (NEURONTIN) 300 MG capsule Take 1 capsule (300 mg total) by mouth 3 (three) times daily. 10/09/20   Sater, Nanine Means, MD  losartan (COZAAR) 25 MG tablet TAKE 1 TABLET(25 MG) BY MOUTH DAILY 09/27/20   Doree Albee, MD  Multiple Vitamins-Minerals (MULTIVITAMIN  GUMMIES ADULT PO) Take 1 capsule by mouth daily.    [provider]  Netarsudil-Latanoprost (ROCKLATAN) 0.02-0.005 % SOLN Apply 1 drop to eye 1 day or 1 dose.     [provider]  sodium chloride (OCEAN) 0.65 % nasal spray Place 1 spray into the nose as needed. Reported on 05/21/2015    [provider]  tadalafil (CIALIS) 5 MG tablet TAKE 1 TABLET(5 MG) BY MOUTH DAILY 06/26/20   Ailene Ards, NP  tamsulosin (FLOMAX) 0.4 MG CAPS capsule TAKE 1 CAPSULE BY MOUTH EVERY DAY 10/16/20   Doree Albee, MD    Family History Family History  Problem Relation Age of Onset   Diabetes  Father    Coronary artery disease Mother    Hypertension Brother    Mental illness Maternal Uncle    Dementia Maternal Grandfather    Seizures Cousin    Drug abuse Other    Seizures Maternal Uncle    ADD / ADHD Neg Hx    Alcohol abuse Neg Hx    Anxiety disorder Neg Hx    Bipolar disorder Neg Hx    Depression Neg Hx    OCD Neg Hx    Paranoid behavior Neg Hx    Schizophrenia Neg Hx    Sexual abuse Neg Hx    Physical abuse Neg Hx     Social History Social History   Tobacco Use   Smoking status: Light Smoker    Packs/day: 1.00    Years: 1.00    Pack years: 1.00    Types: Cigars, Cigarettes    Start date: 09/09/1964   Smokeless tobacco: Never   Tobacco comments:    Puffs on cigars every so often.   Vaping Use   Vaping Use: Never used  Substance Use Topics   Alcohol use: Yes    Alcohol/week: 0.0 standard drinks    Comment: glass of wine occasionally   Drug use: No     Allergies   Patient has no known allergies.   Review of Systems Review of Systems Pertinent negatives listed in HPI  Physical Exam Triage Vital Signs ED Triage Vitals  Enc Vitals Group     BP 11/12/20 0957 (!) 185/107     Pulse Rate 11/12/20 0957 69     Resp 11/12/20 0957 17     Temp 11/12/20 0957 98.3 F (36.8 C)     Temp Source 11/12/20 0957 Oral     SpO2 11/12/20 0957 96 %     Weight --      Height --      Head Circumference --      Peak Flow --      Pain Score 11/12/20 0955 5     Pain Loc --      Pain Edu? --      Excl. in [place]? --    No data found.  Updated Vital Signs BP (!) 178/100 (BP Location: Left Arm)   Pulse 69   Temp 98.3 F (36.8 C) (Oral)   Resp 17   SpO2 96%   Visual Acuity Right Eye Distance:   Left Eye Distance:   Bilateral Distance:    Right Eye Near:   Left Eye Near:    Bilateral Near:     Physical Exam  General Appearance:    Alert, cooperative, no distress  HENT:   Normocephalic, ears normal, nares mucosal edema with congestion, rhinorrhea,  oropharynx clear   Eyes:    PERRL, conjunctiva/corneas  clear, EOM's intact       Lungs:     Clear to auscultation bilaterally, respirations unlabored  Heart:    Regular rate and rhythm  Neurologic:   Awake, alert, oriented x 3. No apparent focal neurological           defect.      UC Treatments / Results  Labs (all labs ordered are listed, but only abnormal results are displayed) Labs Reviewed - No data to display  EKG   Radiology No results found.  Procedures Procedures (including critical care time)  Medications Ordered in UC Medications - No data to display  Initial Impression / Assessment and Plan / UC Course  I have reviewed the triage vital signs and the nursing notes.  Pertinent labs & imaging results that were available during my care of the patient were reviewed by me and considered in my medical decision making (see chart for details).    Patient present for evaluation recurrent symptoms following a recent COVID 19 infection x 14 days ago.  Covering today for subsequent secondary bronchitis and sinusitis. Treatment per discharge medication orders. Advised no need to quarantine or retest. BP elevated (pt recently took medication prior to arrival) recheck of BP per pt more inline with baseline. Advised to continue to monitor BP at home, if readings do not improve phone PCP to discuss increasing Losartan to 50 mg instead of current dose of 25 mg . If any CP, sever HA, or SOB develop, go to the ER. Final Clinical Impressions(s) / UC Diagnoses   Final diagnoses:  None   Discharge Instructions   None    ED Prescriptions   None    PDMP not reviewed this encounter.   Scot Jun, FNP 11/18/20 2027

## 2020-11-22 ENCOUNTER — Telehealth (INDEPENDENT_AMBULATORY_CARE_PROVIDER_SITE_OTHER): Payer: Self-pay

## 2020-11-22 DIAGNOSIS — N401 Enlarged prostate with lower urinary tract symptoms: Secondary | ICD-10-CM

## 2020-11-22 MED ORDER — DUTASTERIDE 0.5 MG PO CAPS: 0.5000 mg | ORAL_CAPSULE | Freq: Every day | ORAL | 0 refills | Status: DC

## 2020-11-22 NOTE — Telephone Encounter (Signed)
Refill approved and sent to pharmacy. 

## 2020-11-22 NOTE — Telephone Encounter (Signed)
Received a refill request of the following medication from [place]:  dutasteride (AVODART) 0.5 MG capsule  Last filled 05/14/2020, #90 with 1 refills

## 2020-12-13 ENCOUNTER — Ambulatory Visit: Payer: Federal, State, Local not specified - PPO | Admitting: Urology

## 2021-01-01 ENCOUNTER — Other Ambulatory Visit (HOSPITAL_COMMUNITY): Payer: Self-pay | Admitting: Family Medicine

## 2021-01-01 DIAGNOSIS — R222 Localized swelling, mass and lump, trunk: Secondary | ICD-10-CM

## 2021-01-02 ENCOUNTER — Other Ambulatory Visit: Payer: Self-pay

## 2021-01-02 ENCOUNTER — Ambulatory Visit (HOSPITAL_COMMUNITY)
Admission: RE | Admit: 2021-01-02 | Discharge: 2021-01-02 | Disposition: A | Discharge status: Home / Self Care | Payer: Federal, State, Local not specified - PPO | Source: Ambulatory Visit | Attending: Family Medicine | Admitting: Family Medicine

## 2021-01-02 DIAGNOSIS — R222 Localized swelling, mass and lump, trunk: Secondary | ICD-10-CM | POA: Diagnosis present

## 2021-01-07 ENCOUNTER — Encounter: Payer: Self-pay | Admitting: Urology

## 2021-01-07 ENCOUNTER — Ambulatory Visit (INDEPENDENT_AMBULATORY_CARE_PROVIDER_SITE_OTHER): Payer: Federal, State, Local not specified - PPO | Admitting: Urology

## 2021-01-07 ENCOUNTER — Other Ambulatory Visit: Payer: Self-pay

## 2021-01-07 VITALS — BP 173/71 | HR 71

## 2021-01-07 DIAGNOSIS — N138 Other obstructive and reflux uropathy: Secondary | ICD-10-CM | POA: Diagnosis not present

## 2021-01-07 DIAGNOSIS — N401 Enlarged prostate with lower urinary tract symptoms: Secondary | ICD-10-CM

## 2021-01-07 DIAGNOSIS — R972 Elevated prostate specific antigen [PSA]: Secondary | ICD-10-CM

## 2021-01-07 LAB — URINALYSIS, ROUTINE W REFLEX MICROSCOPIC
Bilirubin, UA: NEGATIVE
Glucose, UA: NEGATIVE
Ketones, UA: NEGATIVE
Leukocytes,UA: NEGATIVE
Nitrite, UA: NEGATIVE
Protein,UA: NEGATIVE
RBC, UA: NEGATIVE
Specific Gravity, UA: 1.015 (ref 1.005–1.030)
Urobilinogen, Ur: 0.2 mg/dL (ref 0.2–1.0)
pH, UA: 6 (ref 5.0–7.5)

## 2021-01-07 LAB — BLADDER SCAN AMB NON-IMAGING: Scan Result: 0

## 2021-01-07 NOTE — Progress Notes (Signed)
01/07/2021 10:24 AM   William Joseph 1945/10/14 [id number]  Referring provider: Doree Albee, MD No address on file  No chief complaint on file.   HPI:  New pt -   1) PSA elevation - rising PSA of 02/19 5.4 and 07/22 8.3 with < 5% free. No prior biopsy. His dad had PCa.His dad in on Lupron and 75 yo.   2) BPH - on tamsulosin and tadalafil daily. AUASS = 14. Symptoms of freq, urge, and nocturia. His PVR is 0 ml.    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    PMH: Past Medical History:  Diagnosis Date   Anxiety    Essential hypertension, benign    History of cardiac catheterization    Normal coronaries March 2011   History of non-Hodgkin's lymphoma 06/26/2009   Qualifier: Diagnosis of  By: Dwaine Gale, CNA, Sandy     Non Hodgkin's lymphoma ([place])    Nonerosive nonspecific gastritis    EGD - Dr. Laural Golden   Obsessive-compulsive disorder    Palpitations    Sleep apnea     Surgical History: Past Surgical History:  Procedure Laterality Date   COLONOSCOPY N/A 06/29/2015   Procedure: COLONOSCOPY;  Surgeon: Daneil Dolin, MD;  [facility];  Service: Endoscopy;  Laterality: N/A;  9:30 Am - moved to 9:15 - office to notify   ESOPHAGOGASTRODUODENOSCOPY ENDOSCOPY N/A    EXPLORATORY LAPAROTOMY     GLAUCOMA SURGERY Bilateral 11/2013   didnt work   LYMPH NODE BIOPSY     POLYPECTOMY  06/29/2015   Procedure: POLYPECTOMY;  Surgeon: Daneil Dolin, MD;  [facility];  Service: Endoscopy;;  descending colon polyp removed via cold snare   Port-a-cath placement     Tooth implant      Home Medications:  Allergies as of 01/07/2021   No Known Allergies      Medication List        Accurate as of January 07, 2021 10:24 AM. If you have any questions, ask your nurse or doctor.          BC FAST PAIN RELIEF PO Take 1 packet by mouth daily as needed (pain). Reported on 04/26/2015   benzonatate 100 MG capsule Commonly known as:  TESSALON Take 1 capsule (100 mg total) by mouth every 8 (eight) hours.   doxycycline 100 MG capsule Commonly known as: VIBRAMYCIN Take 1 capsule (100 mg total) by mouth 2 (two) times daily.   dutasteride 0.5 MG capsule Commonly known as: AVODART Take 1 capsule (0.5 mg total) by mouth daily.   fluticasone 50 MCG/ACT nasal spray Commonly known as: FLONASE Place 1 spray into both nostrils daily as needed for allergies or rhinitis.   gabapentin 300 MG capsule Commonly known as: NEURONTIN Take 1 capsule (300 mg total) by mouth 3 (three) times daily.   losartan 25 MG tablet Commonly known as: COZAAR TAKE 1 TABLET(25 MG) BY MOUTH DAILY   MULTIVITAMIN GUMMIES ADULT PO Take 1 capsule by mouth daily.   predniSONE 20 MG tablet Commonly known as: DELTASONE Take 2 tablets (40 mg total) by mouth daily with breakfast.   Rocklatan 0.02-0.005 % Soln Generic drug: Netarsudil-Latanoprost Apply 1 drop to eye 1 day or 1 dose.   sodium chloride 0.65 % nasal spray Commonly known as: OCEAN Place 1 spray into the nose as needed. Reported on 05/21/2015   tadalafil 5 MG tablet Commonly known as: CIALIS TAKE 1 TABLET(5 MG) BY MOUTH DAILY  tamsulosin 0.4 MG Caps capsule Commonly known as: FLOMAX TAKE 1 CAPSULE BY MOUTH EVERY DAY   VITAMIN B COMPLEX PO Take by mouth.        Allergies: No Known Allergies  Family History: Family History  Problem Relation Age of Onset   Diabetes Father    Coronary artery disease Mother    Hypertension Brother    Mental illness Maternal Uncle    Dementia Maternal Grandfather    Seizures Cousin    Drug abuse Other    Seizures Maternal Uncle    ADD / ADHD Neg Hx    Alcohol abuse Neg Hx    Anxiety disorder Neg Hx    Bipolar disorder Neg Hx    Depression Neg Hx    OCD Neg Hx    Paranoid behavior Neg Hx    Schizophrenia Neg Hx    Sexual abuse Neg Hx    Physical abuse Neg Hx     Social History:  reports that he has been smoking cigars and  cigarettes. He started smoking about 56 years ago. He has a 1.00 pack-year smoking history. He has never used smokeless tobacco. He reports current alcohol use. He reports that he does not use drugs.   Physical Exam: There were no vitals taken for this visit.  Constitutional:  Alert and oriented, No acute distress. HEENT: [place] AT, moist mucus membranes.  Trachea midline, no masses. Cardiovascular: No clubbing, cyanosis, or edema. Respiratory: Normal respiratory effort, no increased work of breathing. GI: Abdomen is soft, nontender, nondistended, no abdominal masses GU: No CVA tenderness Skin: No rashes, bruises or suspicious lesions. Neurologic: Grossly intact, no focal deficits, moving all 4 extremities. Psychiatric: Normal mood and affect. DRE: prostate benign but only about 30 g - no hard area or nodule   Laboratory Data: Lab Results  Component Value Date   WBC 5.1 10/16/2020   HGB 13.3 10/16/2020   HCT 40.4 10/16/2020   MCV 91.2 10/16/2020   PLT 163 10/16/2020    Lab Results  Component Value Date   CREATININE 1.02 10/16/2020    Lab Results  Component Value Date   PSA 5.4 05/18/2017    Lab Results  Component Value Date   TESTOSTERONE  03/24/2007    430.51 (NOTE)      Tanner Stage       Male              Male         I              < 30 ng/dL        < 10 ng/dL         II             < 150 ng/dL       < 30 ng/dL         III            100-320 ng/dL     < 35 ng/dL         IV             200-970 ng/dL      15-40 ng/dL         V              350-890 ng/dL     10-70 ng/dL      Lab Results  Component Value Date   HGBA1C 5.7 (H) 10/16/2020    Urinalysis No results found for: COLORURINE,  APPEARANCEUR, LABSPEC, PHURINE, GLUCOSEU, HGBUR, BILIRUBINUR, KETONESUR, PROTEINUR, UROBILINOGEN, NITRITE, LEUKOCYTESUR  No results found for: LABMICR, [place], RBCUA, LABEPIT, MUCUS, BACTERIA    Assessment & Plan:    PSA - I had a long discussion with the patient on the nature of  elevated PSA - benign vs malignant causes. We discussed age specific levels and that PCa can be seen on a biopsy with very low PSA levels (<=2.5). We discussed the nature risks and benefits of continued surveillance, other lab tests, imaging as well as prostate biopsy. We discussed the management of prostate cancer might include active surveillance or treatment depending on biopsy findings. All questions answered. PSA was sent and will plan for biopsy. If elevated.   BPH - tams and tadalafil daily - continue.   No follow-ups on file.  Festus Aloe, MD  [facility]  [address] [place], [place] [address] [phone]

## 2021-01-07 NOTE — Progress Notes (Signed)
post void residual=0 Urological Symptom Review  Patient is experiencing the following symptoms: Frequent urination Hard to postpone urination Get up at night to urinate Leakage of urine   Review of Systems  Gastrointestinal (upper)  : Negative for upper GI symptoms  Gastrointestinal (lower) : Negative for lower GI symptoms  Constitutional : Night Sweats Fatigue  Skin: Negative for skin symptoms  Eyes: Negative for eye symptoms  Ear/Nose/Throat : Negative for Ear/Nose/Throat symptoms  Hematologic/Lymphatic: Negative for Hematologic/Lymphatic symptoms  Cardiovascular : Negative for cardiovascular symptoms  Respiratory : Negative for respiratory symptoms  Endocrine: Negative for endocrine symptoms  Musculoskeletal: Negative for musculoskeletal symptoms  Neurological: Negative for neurological symptoms  Psychologic: Negative for psychiatric symptoms

## 2021-01-08 LAB — PSA: Prostate Specific Ag, Serum: 10.1 ng/mL — ABNORMAL HIGH (ref 0.0–4.0)

## 2021-01-09 ENCOUNTER — Telehealth: Payer: Self-pay

## 2021-01-09 NOTE — Telephone Encounter (Signed)
Patient called with no answer. Message left to return call to office. Biopsy scheduled.

## 2021-01-09 NOTE — Telephone Encounter (Signed)
-----   Message from Festus Aloe, MD sent at 01/09/2021  2:22 PM EDT ----- Please let patient know his PSA came back higher at 10, so we need to set him up for 1) prostate biopsy over at [facility] and 2) about a 2 week f/u with me to review results - thanks!  ----- Message ----- From: Iris Pert, LPN Sent: 33/06/5454   8:06 AM EDT To: Festus Aloe, MD  Please review

## 2021-01-15 ENCOUNTER — Other Ambulatory Visit: Payer: Self-pay

## 2021-01-15 ENCOUNTER — Other Ambulatory Visit: Payer: Federal, State, Local not specified - PPO

## 2021-01-15 DIAGNOSIS — R972 Elevated prostate specific antigen [PSA]: Secondary | ICD-10-CM

## 2021-01-15 MED ORDER — LEVOFLOXACIN 750 MG PO TABS: 750.0000 mg | ORAL_TABLET | Freq: Once | ORAL | 0 refills | Status: AC

## 2021-01-15 NOTE — Telephone Encounter (Signed)
-----   Message from Festus Aloe, MD sent at 01/09/2021  2:22 PM EDT ----- Please let patient know his PSA came back higher at 10, so we need to set him up for 1) prostate biopsy over at [facility] and 2) about a 2 week f/u with me to review results - thanks!  ----- Message ----- From: Iris Pert, LPN Sent: 03/13/8717   8:06 AM EDT To: Festus Aloe, MD  Please review

## 2021-01-15 NOTE — Telephone Encounter (Signed)
Patient called and made aware of elevated psa and need to prostate biopsy. Patient accepted biopsy date and time. Biopsy instructions went over with patient and sent via mail. Abt sent to pharmacy and orders placed.

## 2021-01-16 ENCOUNTER — Ambulatory Visit (INDEPENDENT_AMBULATORY_CARE_PROVIDER_SITE_OTHER): Payer: Federal, State, Local not specified - PPO | Admitting: Internal Medicine

## 2021-02-11 ENCOUNTER — Other Ambulatory Visit: Payer: Self-pay | Admitting: Urology

## 2021-02-11 ENCOUNTER — Encounter (HOSPITAL_COMMUNITY): Payer: Self-pay

## 2021-02-11 ENCOUNTER — Ambulatory Visit (INDEPENDENT_AMBULATORY_CARE_PROVIDER_SITE_OTHER): Payer: Federal, State, Local not specified - PPO | Admitting: Urology

## 2021-02-11 ENCOUNTER — Ambulatory Visit (HOSPITAL_COMMUNITY)
Admission: RE | Admit: 2021-02-11 | Discharge: 2021-02-11 | Disposition: A | Discharge status: Home / Self Care | Payer: Federal, State, Local not specified - PPO | Source: Ambulatory Visit | Attending: Urology | Admitting: Urology

## 2021-02-11 ENCOUNTER — Other Ambulatory Visit: Payer: Self-pay

## 2021-02-11 DIAGNOSIS — C61 Malignant neoplasm of prostate: Secondary | ICD-10-CM

## 2021-02-11 DIAGNOSIS — R972 Elevated prostate specific antigen [PSA]: Secondary | ICD-10-CM

## 2021-02-11 MED ORDER — LIDOCAINE HCL (PF) 2 % IJ SOLN: 10.0000 mL | Freq: Once | INTRAMUSCULAR | Status: AC

## 2021-02-11 MED ORDER — LIDOCAINE HCL (PF) 2 % IJ SOLN
INTRAMUSCULAR | Status: AC
  Administered 2021-02-11: 10 mL
  Filled 2021-02-11: qty 10

## 2021-02-11 MED ORDER — LIDOCAINE HCL (PF) 2 % IJ SOLN
INTRAMUSCULAR | Status: AC
  Filled 2021-02-11: qty 10

## 2021-02-11 MED ORDER — GENTAMICIN SULFATE 40 MG/ML IJ SOLN: 160.0000 mg | Freq: Once | INTRAMUSCULAR | Status: AC

## 2021-02-11 MED ORDER — GENTAMICIN SULFATE 40 MG/ML IJ SOLN
INTRAMUSCULAR | Status: AC
  Administered 2021-02-11: 160 mg via INTRAMUSCULAR
  Filled 2021-02-11: qty 4

## 2021-02-11 NOTE — Progress Notes (Signed)
PT tolerated prostate biopsy procedure and antibiotic injections well today. Labs obtained and sent for pathology. PT ambulatory at discharge with no acute distress noted and verbalized understanding of discharge instructions.  

## 2021-02-11 NOTE — Progress Notes (Signed)
William Joseph presents for biopsy.  He is doing well.  No dysuria or gross hematuria.  We discussed again the nature risk benefits and alternatives to prostate biopsy and he elects to proceed.  His Oct 2022 PSA was 10.1. He also may be on dutasteride. His prostate is small and benign on exam.   Prostate Biopsy Procedure   Informed consent was obtained after discussing risks/benefits of the procedure.  A time out was performed to ensure correct patient identity.  Pre-Procedure: - Last PSA Level:  Lab Results  Component Value Date   PSA 5.4 05/18/2017   - Gentamicin given prophylactically - Levaquin 500 mg administered PO -DRE: About a 25 g prostate, smooth without hard area or nodule. -Transrectal Ultrasound performed revealing a 24 gm prostate -No significant hypoechoic or median lobe noted, but I do think there were some hypoechoic areas on the left side that were faint. Prostate was small and no focal or masslike hypoechoic area.  Procedure: - Prostate block performed using 10 cc 1% lidocaine and biopsies taken from sextant areas, a total of 12 under ultrasound guidance.  Post-Procedure: - Patient tolerated the procedure well - He was counseled to seek immediate medical attention if experiences any severe pain, significant bleeding, or fevers - Return in 1-2 weeks to discuss biopsy results

## 2021-02-11 NOTE — Progress Notes (Deleted)
No chief complaint on file.    HISTORY OF PRESENT ILLNESS:  02/11/21 ALL:  William Joseph is a 75 y.o. male here today for follow up for     HISTORY (copied from Dr Garth Bigness previous note)  I had the pleasure of seeing your patient, William Joseph, at [facility] for neurologic consultation regarding the numbness in his right hand.   He is a 75 year old man who ha had numbness in the right index finger since a spider bite in mid-2021.  Initially, he had a burning sensation.  The finger was red and swollen.     He received antibiotics.   As the swelling resolved, the numbness and burning/tingling dysesthesia.  He notes that using a mouse is more difficult.    He denies weakness in the hand/fingers.   The numbness is moslty on the radial side of the dorsal right second finger and the fingertip.   The ulnar side of the finger is fine.      He has no numbness or dysesthesias elsewhere.    REVIEW OF SYSTEMS: Out of a complete 14 system review of symptoms, the patient complains only of the following symptoms, and all other reviewed systems are negative.   ALLERGIES: No Known Allergies   HOME MEDICATIONS: Outpatient Medications Prior to Visit  Medication Sig Dispense Refill   Aspirin-Caffeine (BC FAST PAIN RELIEF PO) Take 1 packet by mouth daily as needed (pain). Reported on 04/26/2015     B Complex Vitamins (VITAMIN B COMPLEX PO) Take by mouth.     benzonatate (TESSALON) 100 MG capsule Take 1 capsule (100 mg total) by mouth every 8 (eight) hours. 21 capsule 0   dutasteride (AVODART) 0.5 MG capsule Take 1 capsule (0.5 mg total) by mouth daily. 90 capsule 0   fluticasone (FLONASE) 50 MCG/ACT nasal spray Place 1 spray into both nostrils daily as needed for allergies or rhinitis.     losartan (COZAAR) 25 MG tablet TAKE 1 TABLET(25 MG) BY MOUTH DAILY 30 tablet 2   Multiple Vitamins-Minerals (MULTIVITAMIN GUMMIES ADULT PO) Take 1 capsule by mouth daily.      predniSONE (DELTASONE) 20 MG tablet Take 2 tablets (40 mg total) by mouth daily with breakfast. 10 tablet 0   SIMBRINZA 1-0.2 % SUSP Apply 1 drop to eye 2 (two) times daily.     tadalafil (CIALIS) 5 MG tablet TAKE 1 TABLET(5 MG) BY MOUTH DAILY 90 tablet 0   tamsulosin (FLOMAX) 0.4 MG CAPS capsule TAKE 1 CAPSULE BY MOUTH EVERY DAY 30 capsule 3   No facility-administered medications prior to visit.     PAST MEDICAL HISTORY: Past Medical History:  Diagnosis Date   Anxiety    Essential hypertension, benign    History of cardiac catheterization    Normal coronaries March 2011   History of non-Hodgkin's lymphoma 06/26/2009   Qualifier: Diagnosis of  By: Dwaine Gale, CNA, Sandy     Non Hodgkin's lymphoma ([place])    Nonerosive nonspecific gastritis    EGD - Dr. Laural Golden   Obsessive-compulsive disorder    Palpitations    Sleep apnea      PAST SURGICAL HISTORY: Past Surgical History:  Procedure Laterality Date   COLONOSCOPY N/A 06/29/2015   Procedure: COLONOSCOPY;  Surgeon: Daneil Dolin, MD;  [facility];  Service: Endoscopy;  Laterality: N/A;  9:30 Am - moved to 9:15 - office to notify   ESOPHAGOGASTRODUODENOSCOPY ENDOSCOPY N/A    EXPLORATORY LAPAROTOMY  GLAUCOMA SURGERY Bilateral 11/2013   didnt work   LYMPH NODE BIOPSY     POLYPECTOMY  06/29/2015   Procedure: POLYPECTOMY;  Surgeon: Daneil Dolin, MD;  [facility]: [facility];  Service: Endoscopy;;  descending colon polyp removed via cold snare   Port-a-cath placement     Tooth implant       FAMILY HISTORY: Family History  Problem Relation Age of Onset   Diabetes Father    Coronary artery disease Mother    Hypertension Brother    Mental illness Maternal Uncle    Dementia Maternal Grandfather    Seizures Cousin    Drug abuse Other    Seizures Maternal Uncle    ADD / ADHD Neg Hx    Alcohol abuse Neg Hx    Anxiety disorder Neg Hx    Bipolar disorder Neg Hx    Depression Neg Hx    OCD Neg Hx    Paranoid behavior  Neg Hx    Schizophrenia Neg Hx    Sexual abuse Neg Hx    Physical abuse Neg Hx      SOCIAL HISTORY: Social History   Socioeconomic History   Marital status: Married    Spouse name: Not on file   Number of children: Not on file   Years of education: Not on file   Highest education level: Not on file  Occupational History   Not on file  Tobacco Use   Smoking status: Light Smoker    Packs/day: 1.00    Years: 1.00    Pack years: 1.00    Types: Cigars, Cigarettes    Start date: 09/09/1964   Smokeless tobacco: Never   Tobacco comments:    Puffs on cigars every so often.   Vaping Use   Vaping Use: Never used  Substance and Sexual Activity   Alcohol use: Yes    Alcohol/week: 0.0 standard drinks    Comment: glass of wine occasionally   Drug use: No   Sexual activity: Not on file  Other Topics Concern   Not on file  Social History Narrative   Not on file   Social Determinants of Health   Financial Resource Strain: Not on file  Food Insecurity: Not on file  Transportation Needs: Not on file  Physical Activity: Not on file  Stress: Not on file  Social Connections: Not on file  Intimate Partner Violence: Not on file     PHYSICAL EXAM  There were no vitals filed for this visit. There is no height or weight on file to calculate BMI.  Generalized: Well developed, in no acute distress  Cardiology: normal rate and rhythm, no murmur auscultated  Respiratory: clear to auscultation bilaterally    Neurological examination  Mentation: Alert oriented to time, place, history taking. Follows all commands speech and language fluent Cranial nerve II-XII: Pupils were equal round reactive to light. Extraocular movements were full, visual field were full on confrontational test. Facial sensation and strength were normal. Uvula tongue midline. Head turning and shoulder shrug  were normal and symmetric. Motor: The motor testing reveals 5 over 5 strength of all 4 extremities. Good  symmetric motor tone is noted throughout.  Sensory: Sensory testing is intact to soft touch on all 4 extremities. No evidence of extinction is noted.  Coordination: Cerebellar testing reveals good finger-nose-finger and heel-to-shin bilaterally.  Gait and station: Gait is normal. Tandem gait is normal. Romberg is negative. No drift is seen.  Reflexes: Deep tendon reflexes are symmetric  and normal bilaterally.    DIAGNOSTIC DATA (LABS, IMAGING, TESTING) - I reviewed patient records, labs, notes, testing and imaging myself where available.  Lab Results  Component Value Date   WBC 5.1 10/16/2020   HGB 13.3 10/16/2020   HCT 40.4 10/16/2020   MCV 91.2 10/16/2020   PLT 163 10/16/2020      Component Value Date/Time   NA 141 10/16/2020 0949   NA 141 05/18/2017 0000   K 4.4 10/16/2020 0949   CL 106 10/16/2020 0949   CO2 27 10/16/2020 0949   GLUCOSE 94 10/16/2020 0949   BUN 13 10/16/2020 0949   BUN 13 05/18/2017 0000   CREATININE 1.02 10/16/2020 0949   CALCIUM 9.7 10/16/2020 0949   PROT 6.7 10/16/2020 0949   ALBUMIN 4.3 05/18/2017 0000   AST 14 10/16/2020 0949   ALT 12 10/16/2020 0949   ALKPHOS 52 04/26/2015 0935   BILITOT 0.5 10/16/2020 0949   GFRNONAA 73 07/12/2020 0818   GFRAA 85 07/12/2020 0818   Lab Results  Component Value Date   CHOL 236 (H) 10/16/2020   HDL 49 10/16/2020   LDLCALC 170 (H) 10/16/2020   TRIG 72 10/16/2020   CHOLHDL 4.8 10/16/2020   Lab Results  Component Value Date   HGBA1C 5.7 (H) 10/16/2020   No results found for: [id number] Lab Results  Component Value Date   TSH 0.92 06/23/2019    No flowsheet data found.   No flowsheet data found.   ASSESSMENT AND PLAN  75 y.o. year old male  has a past medical history of Anxiety, Essential hypertension, benign, History of cardiac catheterization, History of non-Hodgkin's lymphoma (06/26/2009), Non Hodgkin's lymphoma ([place]), Nonerosive nonspecific gastritis, Obsessive-compulsive disorder, Palpitations,  and Sleep apnea. here with    No diagnosis found.   No orders of the defined types were placed in this encounter.    No orders of the defined types were placed in this encounter.     Debbora Presto, MSN, FNP-C 02/11/2021, 4:19 PM  [facility] [address] [place], [place] [address] [phone]

## 2021-02-13 ENCOUNTER — Ambulatory Visit: Payer: Medicare Other | Admitting: Family Medicine

## 2021-02-25 ENCOUNTER — Other Ambulatory Visit (INDEPENDENT_AMBULATORY_CARE_PROVIDER_SITE_OTHER): Payer: Self-pay | Admitting: Nurse Practitioner

## 2021-02-25 ENCOUNTER — Other Ambulatory Visit: Payer: Self-pay

## 2021-02-25 ENCOUNTER — Ambulatory Visit (INDEPENDENT_AMBULATORY_CARE_PROVIDER_SITE_OTHER): Payer: Federal, State, Local not specified - PPO | Admitting: Urology

## 2021-02-25 DIAGNOSIS — C61 Malignant neoplasm of prostate: Secondary | ICD-10-CM

## 2021-02-25 DIAGNOSIS — N401 Enlarged prostate with lower urinary tract symptoms: Secondary | ICD-10-CM

## 2021-02-25 DIAGNOSIS — R3914 Feeling of incomplete bladder emptying: Secondary | ICD-10-CM

## 2021-02-25 NOTE — Progress Notes (Signed)
02/25/2021 4:15 PM   Norton Pastel [birth year] [id number]  Referring provider: No referring provider defined for this encounter.  No chief complaint on file.   HPI:  1) prostate cancer -diagnosed with intermediate to high risk prostate cancer November 2022.  Here to review biopsy results. His Oct 2022 PSA was 10.1 (20.2). He is also on dutasteride. His prostate is small and benign on exam. His dad had PCa.His dad in on Lupron and 75 yo.   Biopsy: November 2022-intermediate to high risk PSA 10.1 (20.2)-dutasteride use T1c (hypoechoic areas were noted in the left prostate on ultrasound) Prostate 25 g Gleason 4+3=7, 3 cores, 60 to 80%, left Gleason 3+4=7, 2 cores, 20 to 40%, left 5/12   2) BPH - on tamsulosin, dutasteride and tadalafil daily. AUASS = 14. Symptoms of freq, urge, and nocturia. His PVR is 0 ml.      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     PMH: Past Medical History:  Diagnosis Date   Anxiety    Essential hypertension, benign    History of cardiac catheterization    Normal coronaries March 2011   History of non-Hodgkin's lymphoma 06/26/2009   Qualifier: Diagnosis of  By: Dwaine Gale, CNA, Sandy     Non Hodgkin's lymphoma ([place])    Nonerosive nonspecific gastritis    EGD - Dr. Laural Golden   Obsessive-compulsive disorder    Palpitations    Sleep apnea     Surgical History: Past Surgical History:  Procedure Laterality Date   COLONOSCOPY N/A 06/29/2015   Procedure: COLONOSCOPY;  Surgeon: Daneil Dolin, MD;  [facility];  Service: Endoscopy;  Laterality: N/A;  9:30 Am - moved to 9:15 - office to notify   ESOPHAGOGASTRODUODENOSCOPY ENDOSCOPY N/A    EXPLORATORY LAPAROTOMY     GLAUCOMA SURGERY Bilateral 11/2013   didnt work   LYMPH NODE BIOPSY     POLYPECTOMY  06/29/2015   Procedure: POLYPECTOMY;  Surgeon: Daneil Dolin, MD;  [facility];  Service: Endoscopy;;  descending colon polyp removed via cold snare    Port-a-cath placement     Tooth implant      Home Medications:  Allergies as of 02/25/2021   No Known Allergies      Medication List        Accurate as of February 25, 2021  4:15 PM. If you have any questions, ask your nurse or doctor.          BC FAST PAIN RELIEF PO Take 1 packet by mouth daily as needed (pain). Reported on 04/26/2015   benzonatate 100 MG capsule Commonly known as: TESSALON Take 1 capsule (100 mg total) by mouth every 8 (eight) hours.   dutasteride 0.5 MG capsule Commonly known as: AVODART Take 1 capsule (0.5 mg total) by mouth daily.   fluticasone 50 MCG/ACT nasal spray Commonly known as: FLONASE Place 1 spray into both nostrils daily as needed for allergies or rhinitis.   losartan 25 MG tablet Commonly known as: COZAAR TAKE 1 TABLET(25 MG) BY MOUTH DAILY   MULTIVITAMIN GUMMIES ADULT PO Take 1 capsule by mouth daily.   predniSONE 20 MG tablet Commonly known as: DELTASONE Take 2 tablets (40 mg total) by mouth daily with breakfast.   Simbrinza 1-0.2 % Susp Generic drug: Brinzolamide-Brimonidine Apply 1 drop to eye 2 (two) times daily.   tadalafil 5 MG tablet Commonly known as: CIALIS TAKE 1 TABLET(5 MG) BY MOUTH DAILY   tamsulosin 0.4 MG Caps  capsule Commonly known as: FLOMAX TAKE 1 CAPSULE BY MOUTH EVERY DAY   VITAMIN B COMPLEX PO Take by mouth.        Allergies: No Known Allergies  Family History: Family History  Problem Relation Age of Onset   Diabetes Father    Coronary artery disease Mother    Hypertension Brother    Mental illness Maternal Uncle    Dementia Maternal Grandfather    Seizures Cousin    Drug abuse Other    Seizures Maternal Uncle    ADD / ADHD Neg Hx    Alcohol abuse Neg Hx    Anxiety disorder Neg Hx    Bipolar disorder Neg Hx    Depression Neg Hx    OCD Neg Hx    Paranoid behavior Neg Hx    Schizophrenia Neg Hx    Sexual abuse Neg Hx    Physical abuse Neg Hx     Social History:  reports  that he has been smoking cigars and cigarettes. He started smoking about 56 years ago. He has a 1.00 pack-year smoking history. He has never used smokeless tobacco. He reports current alcohol use. He reports that he does not use drugs.   Physical Exam: There were no vitals taken for this visit.  Constitutional:  Alert and oriented, No acute distress. HEENT: [place] AT, moist mucus membranes.  Trachea midline, no masses. Cardiovascular: No clubbing, cyanosis, or edema. Respiratory: Normal respiratory effort, no increased work of breathing. GI: Abdomen is soft, nontender, nondistended, no abdominal masses Skin: No rashes, bruises or suspicious lesions. Neurologic: Grossly intact, no focal deficits, moving all 4 extremities. Psychiatric: Normal mood and affect.  Laboratory Data: Lab Results  Component Value Date   WBC 5.1 10/16/2020   HGB 13.3 10/16/2020   HCT 40.4 10/16/2020   MCV 91.2 10/16/2020   PLT 163 10/16/2020    Lab Results  Component Value Date   CREATININE 1.02 10/16/2020    Lab Results  Component Value Date   PSA 5.4 05/18/2017    Lab Results  Component Value Date   TESTOSTERONE  03/24/2007    430.51 (NOTE)      Tanner Stage       Male              Male         I              < 30 ng/dL        < 10 ng/dL         II             < 150 ng/dL       < 30 ng/dL         III            100-320 ng/dL     < 35 ng/dL         IV             200-970 ng/dL      15-40 ng/dL         V              350-890 ng/dL     10-70 ng/dL      Lab Results  Component Value Date   HGBA1C 5.7 (H) 10/16/2020    Urinalysis    Component Value Date/Time   APPEARANCEUR Clear 01/07/2021 1037   GLUCOSEU Negative 01/07/2021 1037   BILIRUBINUR Negative 01/07/2021 1037   PROTEINUR Negative  01/07/2021 1037   NITRITE Negative 01/07/2021 1037   LEUKOCYTESUR Negative 01/07/2021 1037    Lab Results  Component Value Date   LABMICR Comment 01/07/2021      Assessment & Plan:    Prostate  cancer - I had a long discussion with the patient using the prostate cancer handout and his path report.  We went over his stage, grade and prognosis and the relevant anatomy. We discussed the nature risks and benefits of active surveillance, radical prostatectomy, IMRT (+/- brachytherapy, +/- ADT). We discussed specifically how each treatment might affect the bowel, bladder and sexual function. We discussed how each treatment might effect salvage treatments. We discussed the role of other modalities in the treatment of prostate cancer including chemotherapy, HIFU and cryotherapy (even focal or left hemi).   Will set up for PET scan and refer to Rad Oncology for discussion for external beam. We also discussed the role of SpaceOar and gold seeds - nature r/b/a.  His dad had "Lupron" and wants to do something like that and not Orgovyx.   No follow-ups on file.  Festus Aloe, MD  [facility]  [address] [place],  [address] [phone]

## 2021-03-12 ENCOUNTER — Ambulatory Visit: Payer: Medicare Other | Admitting: Neurology

## 2021-03-13 ENCOUNTER — Encounter (HOSPITAL_COMMUNITY)
Admission: RE | Admit: 2021-03-13 | Discharge: 2021-03-13 | Disposition: A | Discharge status: Home / Self Care | Payer: Federal, State, Local not specified - PPO | Source: Ambulatory Visit | Attending: Urology | Admitting: Urology

## 2021-03-13 DIAGNOSIS — C61 Malignant neoplasm of prostate: Secondary | ICD-10-CM | POA: Diagnosis not present

## 2021-03-22 ENCOUNTER — Telehealth: Payer: Self-pay

## 2021-03-22 NOTE — Telephone Encounter (Signed)
Spoke with patient and notified patient of results.

## 2021-03-22 NOTE — Telephone Encounter (Signed)
Patient needing a call back on PET scan results asap.  Call back wife  [name] at:  [phone]  Thanks, Helene Kelp

## 2021-03-24 ENCOUNTER — Other Ambulatory Visit: Payer: Self-pay

## 2021-03-24 DIAGNOSIS — C61 Malignant neoplasm of prostate: Secondary | ICD-10-CM

## 2021-04-03 NOTE — Progress Notes (Signed)
[facility] Location of Tumor / Histology: Prostate Ca  If Prostate Cancer, Gleason Score is (4 + 3) and PSA is (10.1 as of 10/22)  Biopsies: Dr. Junious Silk  November 2022-intermediate to high risk PSA 10.1 (20.2)-dutasteride use T1c (hypoechoic areas were noted in the left prostate on ultrasound) Prostate 25 g Gleason 4+3=7, 3 cores, 60 to 80%, left Gleason 3+4=7, 2 cores, 20 to 40%, left 5/12  Past/Anticipated interventions by urology, if any:   Weight changes, if any:   Stable  IPSS:  14 SHIM:  21  Bowel/Bladder complaints, if any:   No bowel or bladder issues.  Nausea/Vomiting, if any:  No  Pain issues, if any:  0/10  SAFETY ISSUES: Prior radiation? No Pacemaker/ICD?  No Possible current pregnancy?  Male Is the patient on methotrexate? No  Current Complaints / other details:  Wants more information on treatment option.

## 2021-04-05 ENCOUNTER — Telehealth: Payer: Self-pay | Admitting: Urology

## 2021-04-05 ENCOUNTER — Ambulatory Visit (HOSPITAL_COMMUNITY)
Admission: RE | Admit: 2021-04-05 | Discharge: 2021-04-05 | Disposition: A | Discharge status: Home / Self Care | Payer: Federal, State, Local not specified - PPO | Source: Ambulatory Visit | Attending: Urology | Admitting: Urology

## 2021-04-05 ENCOUNTER — Other Ambulatory Visit: Payer: Self-pay

## 2021-04-05 DIAGNOSIS — C61 Malignant neoplasm of prostate: Secondary | ICD-10-CM | POA: Insufficient documentation

## 2021-04-05 DIAGNOSIS — E041 Nontoxic single thyroid nodule: Secondary | ICD-10-CM

## 2021-04-05 NOTE — Telephone Encounter (Signed)
1.1 cm right mid thyroid nodule meets TI-RADS criteria for fine-needle aspiration. This nodule also demonstrates increased metabolic activity by PET scan.  I made a referral to IR for aspiration.

## 2021-04-09 NOTE — Telephone Encounter (Signed)
Patient was called and understanding. Had no questions.

## 2021-04-09 NOTE — Telephone Encounter (Signed)
-----   Message from Festus Aloe, MD sent at 04/05/2021  1:59 PM EST ----- Let William Joseph know his thyroid imaging showed a nodule or mass in his thyroid gland and radiology recommends a biopsy. I made a referral to IR for thyroid biopsy and aspiration which he will need to be scheduled - thanks. He should continue with the prostate cancer treatments and I'll let him know what the thyroid biopsy shows.    ----- Message ----- From: Clearence Cheek, CMA Sent: 04/05/2021  10:42 AM EST To: Festus Aloe, MD  Please review

## 2021-04-10 ENCOUNTER — Telehealth: Payer: Self-pay | Admitting: *Deleted

## 2021-04-10 ENCOUNTER — Ambulatory Visit
Admission: RE | Admit: 2021-04-10 | Discharge: 2021-04-10 | Disposition: A | Discharge status: Home / Self Care | Payer: Federal, State, Local not specified - PPO | Source: Ambulatory Visit | Attending: Radiation Oncology | Admitting: Radiation Oncology

## 2021-04-10 ENCOUNTER — Other Ambulatory Visit: Payer: Self-pay

## 2021-04-10 VITALS — BP 161/86 | HR 92 | Temp 97.0°F | Resp 18 | Ht 74.0 in | Wt 222.0 lb

## 2021-04-10 DIAGNOSIS — E041 Nontoxic single thyroid nodule: Secondary | ICD-10-CM | POA: Diagnosis not present

## 2021-04-10 DIAGNOSIS — G4730 Sleep apnea, unspecified: Secondary | ICD-10-CM | POA: Diagnosis not present

## 2021-04-10 DIAGNOSIS — Z7952 Long term (current) use of systemic steroids: Secondary | ICD-10-CM | POA: Diagnosis not present

## 2021-04-10 DIAGNOSIS — Z8572 Personal history of non-Hodgkin lymphomas: Secondary | ICD-10-CM | POA: Diagnosis not present

## 2021-04-10 DIAGNOSIS — R918 Other nonspecific abnormal finding of lung field: Secondary | ICD-10-CM | POA: Diagnosis not present

## 2021-04-10 DIAGNOSIS — F17210 Nicotine dependence, cigarettes, uncomplicated: Secondary | ICD-10-CM | POA: Insufficient documentation

## 2021-04-10 DIAGNOSIS — C61 Malignant neoplasm of prostate: Secondary | ICD-10-CM | POA: Diagnosis present

## 2021-04-10 DIAGNOSIS — I10 Essential (primary) hypertension: Secondary | ICD-10-CM | POA: Diagnosis not present

## 2021-04-10 DIAGNOSIS — I700 Atherosclerosis of aorta: Secondary | ICD-10-CM | POA: Diagnosis not present

## 2021-04-10 DIAGNOSIS — F429 Obsessive-compulsive disorder, unspecified: Secondary | ICD-10-CM | POA: Insufficient documentation

## 2021-04-10 NOTE — Telephone Encounter (Signed)
CALLED PATIENT TO INFORM OF ADT APPT. @ DR. Lyndal Rainbow OFFICE ON 05/15/21 - ARRIVAL TIME- 2 PM, SPOKE WITH PATIENT AND HE IS AWARE OF THIS APPT.

## 2021-04-10 NOTE — Progress Notes (Signed)
Radiation Oncology         [phone] ________________________________  Initial Outpatient Consultation  Name: William Joseph MRN: [id number]  Date: 04/10/2021  DOB: 03-10-1946  GY:KZLD, Edwinna Areola, MD  Festus Aloe, MD   REFERRING PHYSICIAN: Festus Aloe, MD  DIAGNOSIS: 76 y.o. gentleman with Stage T1c adenocarcinoma of the prostate with Gleason score of 4+3, and PSA of 20.2 (adjusted for dutasteride).    ICD-10-CM   1. Malignant neoplasm of prostate ([place])  C61       HISTORY OF PRESENT ILLNESS: William Joseph is a 76 y.o. male with a diagnosis of prostate cancer. He has a longstanding history of BPH with BOO, managed with dutasteride, tamsulosin and Cialis at the direction of his PCP.  He was noted to have an elevated PSA of 5.4 in 05/2017, by his primary care physician, Dr. Anastasio Champion.  His PSA was further elevated at 8.3 (16.6 adjusted for dutasteride) and 10/24/2020 so he was referred for evaluation in urology by Dr. Junious Silk on 01/07/2021,  digital rectal examination was performed at that time revealing no concerning findings or discrete nodularity.  The patient proceeded to transrectal ultrasound with 12 biopsies of the prostate on 02/11/2021.  The prostate volume measured 24 cc.  Out of 12 core biopsies, 5 were positive.  The maximum Gleason score was 4+3, and this was seen in the left base lateral, left mid lateral and left apex lateral, all with approximately 80% involvement.  Additionally, Gleason 3+4 disease was noted in the left mid and left apex.  He had a PSMA PET scan on 03/13/2021 to complete his disease staging and this was without any evidence of lymphadenopathy, visceral or osseous metastatic disease.  They did note a suspicion for extracapsular extension on the left as well as incidental small liver and renal cysts.  The patient reviewed the biopsy results with his urologist and he has kindly been referred today for discussion of potential radiation treatment  options. Of note, he does have a history of Stage IIA DLBCL, treated first with EPOCH then RCHOP, under the care and direction of Dr. Whitney Muse.  He had a CR after second cycle and restaging PET/CT December 2011 was unremarkable.  He last saw Dr. Whitney Muse in 2017 and has been followed closely by his PCP since that time.   PREVIOUS RADIATION THERAPY: No  PAST MEDICAL HISTORY:  Past Medical History:  Diagnosis Date   Anxiety    Essential hypertension, benign    History of cardiac catheterization    Normal coronaries March 2011   History of non-Hodgkin's lymphoma 06/26/2009   Qualifier: Diagnosis of  By: Dwaine Gale, CNA, Sandy     Non Hodgkin's lymphoma ([place])    Nonerosive nonspecific gastritis    EGD - Dr. Laural Golden   Obsessive-compulsive disorder    Palpitations    Sleep apnea       PAST SURGICAL HISTORY: Past Surgical History:  Procedure Laterality Date   COLONOSCOPY N/A 06/29/2015   Procedure: COLONOSCOPY;  Surgeon: Daneil Dolin, MD;  [facility];  Service: Endoscopy;  Laterality: N/A;  9:30 Am - moved to 9:15 - office to notify   ESOPHAGOGASTRODUODENOSCOPY ENDOSCOPY N/A    EXPLORATORY LAPAROTOMY     GLAUCOMA SURGERY Bilateral 11/2013   didnt work   LYMPH NODE BIOPSY     POLYPECTOMY  06/29/2015   Procedure: POLYPECTOMY;  Surgeon: Daneil Dolin, MD;  Location: [facility];  Service: Endoscopy;;  descending colon polyp removed via cold snare  Port-a-cath placement     Tooth implant      FAMILY HISTORY:  Family History  Problem Relation Age of Onset   Diabetes Father    Coronary artery disease Mother    Hypertension Brother    Mental illness Maternal Uncle    Dementia Maternal Grandfather    Seizures Cousin    Drug abuse Other    Seizures Maternal Uncle    ADD / ADHD Neg Hx    Alcohol abuse Neg Hx    Anxiety disorder Neg Hx    Bipolar disorder Neg Hx    Depression Neg Hx    OCD Neg Hx    Paranoid behavior Neg Hx    Schizophrenia Neg Hx    Sexual abuse Neg  Hx    Physical abuse Neg Hx     SOCIAL HISTORY:  Social History   Socioeconomic History   Marital status: Married    Spouse name: Not on file   Number of children: Not on file   Years of education: Not on file   Highest education level: Not on file  Occupational History   Not on file  Tobacco Use   Smoking status: Light Smoker    Packs/day: 1.00    Years: 1.00    Pack years: 1.00    Types: Cigars, Cigarettes    Start date: 09/09/1964   Smokeless tobacco: Never   Tobacco comments:    Puffs on cigars every so often.   Vaping Use   Vaping Use: Never used  Substance and Sexual Activity   Alcohol use: Yes    Alcohol/week: 0.0 standard drinks    Comment: glass of wine occasionally   Drug use: No   Sexual activity: Not on file  Other Topics Concern   Not on file  Social History Narrative   Not on file   Social Determinants of Health   Financial Resource Strain: Not on file  Food Insecurity: Not on file  Transportation Needs: Not on file  Physical Activity: Not on file  Stress: Not on file  Social Connections: Not on file  Intimate Partner Violence: Not on file    ALLERGIES: Patient has no known allergies.  MEDICATIONS:  Current Outpatient Medications  Medication Sig Dispense Refill   Aspirin-Caffeine (BC FAST PAIN RELIEF PO) Take 1 packet by mouth daily as needed (pain). Reported on 04/26/2015     B Complex Vitamins (VITAMIN B COMPLEX PO) Take by mouth.     benzonatate (TESSALON) 100 MG capsule Take 1 capsule (100 mg total) by mouth every 8 (eight) hours. 21 capsule 0   dutasteride (AVODART) 0.5 MG capsule Take 1 capsule (0.5 mg total) by mouth daily. 90 capsule 0   fluticasone (FLONASE) 50 MCG/ACT nasal spray Place 1 spray into both nostrils daily as needed for allergies or rhinitis.     losartan (COZAAR) 25 MG tablet TAKE 1 TABLET(25 MG) BY MOUTH DAILY 30 tablet 2   mometasone (ELOCON) 0.1 % cream SMARTSIG:Sparingly Topical Daily     Multiple Vitamins-Minerals  (MULTIVITAMIN GUMMIES ADULT PO) Take 1 capsule by mouth daily.     predniSONE (DELTASONE) 20 MG tablet Take 2 tablets (40 mg total) by mouth daily with breakfast. 10 tablet 0   rosuvastatin (CRESTOR) 10 MG tablet Take 10 mg by mouth at bedtime.     SIMBRINZA 1-0.2 % SUSP Apply 1 drop to eye 2 (two) times daily.     tadalafil (CIALIS) 5 MG tablet TAKE 1 TABLET(5 MG) BY MOUTH  DAILY 90 tablet 0   tamsulosin (FLOMAX) 0.4 MG CAPS capsule TAKE 1 CAPSULE BY MOUTH EVERY DAY 30 capsule 3   No current facility-administered medications for this encounter.    REVIEW OF SYSTEMS:  On review of systems, the patient reports that he is doing well overall. He denies any chest pain, shortness of breath, cough, fevers, chills, night sweats, unintended weight changes. He denies any bowel disturbances, and denies abdominal pain, nausea or vomiting. He denies any new musculoskeletal or joint aches or pains. His IPSS was 14, indicating moderate urinary symptoms with nocturia x5 per night, urgency, intermittency and feelings of incomplete bladder emptying despite taking the dutasteride, tamsulosin and Cialis daily as prescribed. His SHIM was 21, indicating he has mild erectile dysfunction. A complete review of systems is obtained and is otherwise negative.    PHYSICAL EXAM:  Wt Readings from Last 3 Encounters:  04/10/21 222 lb (100.7 kg)  10/16/20 215 lb 9.6 oz (97.8 kg)  10/09/20 216 lb (98 kg)   Temp Readings from Last 3 Encounters:  04/10/21 (!) 97 F (36.1 C) (Temporal)  02/11/21 98.3 F (36.8 C) (Oral)  11/12/20 98.3 F (36.8 C) (Oral)   BP Readings from Last 3 Encounters:  04/10/21 (!) 161/86  02/11/21 (!) 180/82  01/07/21 (!) 173/71   Pulse Readings from Last 3 Encounters:  04/10/21 92  02/11/21 72  01/07/21 71   Pain Assessment Pain Score: 0-No pain/10  In general this is a well appearing African-American male in no acute distress. He's alert and oriented x4 and appropriate throughout the  examination. Cardiopulmonary assessment is negative for acute distress, and he exhibits normal effort.     KPS = 100  100 - Normal; no complaints; no evidence of disease. 90   - Able to carry on normal activity; minor signs or symptoms of disease. 80   - Normal activity with effort; some signs or symptoms of disease. 62   - Cares for self; unable to carry on normal activity or to do active work. 60   - Requires occasional assistance, but is able to care for most of his personal needs. 50   - Requires considerable assistance and frequent medical care. 42   - Disabled; requires special care and assistance. 44   - Severely disabled; hospital admission is indicated although death not imminent. 77   - Very sick; hospital admission necessary; active supportive treatment necessary. 10   - Moribund; fatal processes progressing rapidly. 0     - Dead  Karnofsky DA, Abelmann [place], Craver LS and Burchenal [facility] [phone]) The use of the nitrogen mustards in the palliative treatment of carcinoma: with particular reference to bronchogenic carcinoma Cancer 1 [phone]  LABORATORY DATA:  Lab Results  Component Value Date   WBC 5.1 10/16/2020   HGB 13.3 10/16/2020   HCT 40.4 10/16/2020   MCV 91.2 10/16/2020   PLT 163 10/16/2020   Lab Results  Component Value Date   NA 141 10/16/2020   K 4.4 10/16/2020   CL 106 10/16/2020   CO2 27 10/16/2020   Lab Results  Component Value Date   ALT 12 10/16/2020   AST 14 10/16/2020   ALKPHOS 52 04/26/2015   BILITOT 0.5 10/16/2020     RADIOGRAPHY: CT CHEST WO CONTRAST  Result Date: 04/08/2021 CLINICAL DATA:  75 year old male with history of nontraumatic chest wall pain. History of prostate cancer. Remote history of lymphoma. EXAM: CT CHEST WITHOUT CONTRAST TECHNIQUE: Multidetector CT imaging of the chest  was performed following the standard protocol without IV contrast. COMPARISON:  Chest CT 05/25/2017.  PET-CT 03/13/2021. FINDINGS: Cardiovascular: Heart size is  normal. There is no significant pericardial fluid, thickening or pericardial calcification. There is aortic atherosclerosis, as well as atherosclerosis of the great vessels of the mediastinum and the coronary arteries, including calcified atherosclerotic plaque in the left anterior descending coronary artery. Mediastinum/Nodes: No pathologically enlarged mediastinal or hilar lymph nodes. Esophagus is unremarkable in appearance. No axillary lymphadenopathy. Lungs/Pleura: Several tiny clustered pulmonary nodules are noted in the right upper lobe, largest of which measures 6 mm (axial image 34 of series 5), unchanged. There is also a subtle ground-glass attenuation nodule in the left upper lobe (axial image 77 of series 5) measuring 1.7 x 1.6 cm. No other larger more suspicious appearing pulmonary nodules or masses are noted. No acute consolidative airspace disease. No pleural effusions. Upper Abdomen: Large low-attenuation lesion in segments 2/3 of the liver (axial image 139 of series 2), incompletely characterized on today's non-contrast CT examination, but statistically likely to represent a cyst. Aortic atherosclerosis. Musculoskeletal: There are no aggressive appearing lytic or blastic lesions noted in the visualized portions of the skeleton. IMPRESSION: 1. Small pulmonary nodules, stable compared to the recent prior PET-CT. These are nonspecific but warrant close attention on follow-up studies. Repeat noncontrast chest CT is recommended in 3-6 months to ensure continued stability. 2. Aortic atherosclerosis, in addition to left anterior descending coronary artery disease. Assessment for potential risk factor modification, dietary therapy or pharmacologic therapy may be warranted, if clinically indicated. Aortic Atherosclerosis (ICD10-I70.0). Electronically Signed   By: Vinnie Langton M.D.   On: 04/08/2021 16:38   US THYROID  Result Date: 04/05/2021 CLINICAL DATA:  Incidental on PET.  History of prostate  carcinoma. EXAM: THYROID ULTRASOUND TECHNIQUE: Ultrasound examination of the thyroid gland and adjacent soft tissues was performed. COMPARISON:  PET scan on 03/13/2021 FINDINGS: Parenchymal Echotexture: Normal Isthmus: 0.6 cm Right lobe: 4.8 x 1.1 x 1.7 cm Left lobe: 5.2 x 2.1 x 2.6 cm _________________________________________________________ Estimated total number of nodules >/= 1 cm: 1 Number of spongiform nodules >/=  2 cm not described below (TR1): 0 Number of mixed cystic and solid nodules >/= 1.5 cm not described below (TR2): 0 _________________________________________________________ Nodule # 1: Location: Right; Mid Maximum size: 1.1 cm; Other 2 dimensions: 0.8 x 1.1 cm Composition: solid/almost completely solid (2) Echogenicity: hypoechoic (2) Shape: taller-than-wide (3) Margins: ill-defined (0) Echogenic foci: macrocalcifications (1) ACR TI-RADS total points: 8. ACR TI-RADS risk category: TR5 (>/= 7 points). ACR TI-RADS recommendations: **Given size (>/= 1.0 cm) and appearance, fine needle aspiration of this highly suspicious nodule should be considered based on TI-RADS criteria. _________________________________________________________ No abnormal lymph nodes identified by ultrasound. IMPRESSION: 1.1 cm right mid thyroid nodule meets TI-RADS criteria for fine-needle aspiration. This nodule also demonstrates increased metabolic activity by PET scan. The above is in keeping with the ACR TI-RADS recommendations - J Am Coll Radiol 2017;14:587-595. Electronically Signed   By: Aletta Edouard M.D.   On: 04/05/2021 10:39   NM PET (PSMA) SKULL TO MID THIGH  Result Date: 03/15/2021 CLINICAL DATA:  New diagnosis of prostate cancer in a 76 year old male. EXAM: NUCLEAR MEDICINE PET SKULL BASE TO THIGH TECHNIQUE: 8.9 mCi F18 Piflufolastat (Pylarify) was injected intravenously. Full-ring PET imaging was performed from the skull base to thigh after the radiotracer. CT data was obtained and used for attenuation  correction and anatomic localization. COMPARISON:  Comparison imaging from 2019 of the chest and  PET imaging from 2009. FINDINGS: NECK No radiotracer activity in neck lymph nodes. Incidental CT finding: Focal uptake in the RIGHT neck near the RIGHT hemi thyroid (image 72/4) potential nodule in the RIGHT thyroid 2.1 cm in greatest dimension. No focal area of nodal enlargement in the neck or in the area of the increased metabolic activity reported above. CHEST No radiotracer accumulation within mediastinal or hilar lymph nodes. Spiculated RIGHT upper lobe pulmonary nodule measures 7 x 4 mm (image 22/8) Additional RIGHT upper lobe nodule measuring 4 mm (image 25/8) these were not present on prior lung cancer screening evaluations. Other small nodules are present in the RIGHT upper lobe as well similarly absent from previous imaging. (Image 34/8) 5 mm nodule in the RIGHT upper lobe. At least 4 additional small nodules. Area in the mid chest is limited with respect to evaluation due to the presence of respiratory motion artifact. Airways are patent. Incidental CT finding: Calcified atheromatous plaque in the thoracic aorta. Heart size mildly enlarged without substantial pericardial effusion. Normal caliber of central pulmonary vessels. No adenopathy by size criteria in the chest. ABDOMEN/PELVIS Prostate: Focal activity in the LEFT prostate gland with a maximum SUV of 10.0 this measures approximately 2.4 cm greatest axial dimension (image 222/4. Measurements obtained from estimated PET activity. Bulging of the LEFT prostate capsule could potentially be present, not well evaluated. Lymph nodes: No abnormal radiotracer accumulation within pelvic or abdominal nodes. Liver: No evidence of liver metastasis Incidental CT finding: Stable findings of cyst in the LEFT hemi liver. Tiny cyst also in the RIGHT hemi liver similar LE unchanged. No pericholecystic stranding. No acute process related to the pancreas. Spleen normal size.  Small low-density lesions in the LEFT kidney presumably small cysts. No hydronephrosis. Urinary bladder is collapsed. No acute gastrointestinal process. The appendix is normal. Sigmoid diverticular changes. Aortic atherosclerosis without aneurysm. No adenopathy by size criteria in the abdomen or pelvis. SKELETON No focal  activity to suggest skeletal metastasis. IMPRESSION: Signs of focal activity in the LEFT hemi prostate compatible with known prostate cancer. No signs of metastatic disease to the neck, chest, abdomen or pelvis. Focal bulging along the LEFT prostate gland, nonspecific on limited PET imaging and given recent biopsy. Given the extensive nature of activity along the LEFT gland and this contour abnormality could consider MR imaging for staging as warranted. Activity about the RIGHT low neck adjacent to the thyroid, this would be an unlikely site of isolated metastasis and there is no visible adenopathy in this location. Potential thyroid nodule is noted in this area. Recommend thyroid ultrasound and ultrasound of the RIGHT neck for further evaluation. Pulmonary nodules in the RIGHT upper lobe largest is mildly spiculated with numerous small nodules seen throughout the RIGHT upper lobe. Largest measuring approximately 6 mm mean diameter. These may be infectious or inflammatory given multiplicity and do not show signs of radiotracer accumulation. However, given the spiculated nature of the dominant nodule these remain nonspecific, dominant nodule with spiculated morphology does not allow for exclusion of developing bronchogenic neoplasm. Short interval follow-up is suggested at 3 months. These results will be called to the ordering clinician or representative by the Radiologist Assistant, and communication documented in the PACS or [employer]. Aortic Atherosclerosis (ICD10-I70.0). Electronically Signed   By: Zetta Bills M.D.   On: 03/15/2021 11:40      IMPRESSION/PLAN: 1. 76 y.o. gentleman  with Stage T1c adenocarcinoma of the prostate with Gleason Score of 4+3, and PSA of 20.2 (adjusted for dutasteride).  We discussed the patient's workup and outlined the nature of prostate cancer in this setting. The patient's T stage, Gleason's score, and PSA put him into the unfavorable intermediate-high risk group. Accordingly, he is eligible for a variety of potential treatment options including prostatectomy or ADT in combination with  5.5-8 weeks of external radiation, 5 weeks of external radiation with an upfront brachytherapy boost, or prostatectomy. We discussed the available radiation techniques, and focused on the details and logistics of delivery. He is not an ideal candidate for brachytherapy or brachytherapy boost given his persistent LUTS despite multimodal therapy with dutasteride, tamsulosin and Cialis. We discussed and outlined the risks, benefits, short and long-term effects associated with radiotherapy and compared and contrasted these with prostatectomy. We discussed the role of SpaceOAR gel in reducing the rectal toxicity associated with radiotherapy. We also detailed the role of ADT in the treatment of unfavorable intermediate-high risk prostate cancer and outlined the associated side effects that could be expected with this therapy.  He was encouraged to ask questions that were answered to his stated satisfaction.  At the conclusion of our conversation, the patient is interested in moving forward with 5.5-8 weeks of external beam therapy in combination with ADT.  He lives in [place] and prefers to have his daily radiation treatments closer to home at [facility] in [place].  He has not received his first Lupron injection so we will share our discussion with Dr. Junious Silk and make arrangements for a follow-up visit, first available to start ADT now.  We will make a referral to Dr. Sharyn Dross in radiation oncology at [facility], to discuss prostate IMRT closer to his home.  He will  need fiducial markers and SpaceOAR gel placement in March 2023, prior to CT simulation, to reduce rectal toxicity from radiotherapy. The patient appears to have a good understanding of his disease and our treatment recommendations which are of curative intent and is in agreement with the stated plan.  Therefore, we will share this information with Dr. Junious Silk and Dr. Lynnette Caffey in an effort to move forward with treatment planning accordingly, in anticipation of beginning IMRT in [place], approximately 2 months after starting ADT. Marland Kitchen  We personally spent 75 minutes in this encounter including chart review, reviewing radiological studies, meeting face-to-face with the patient, entering orders and completing documentation.    Nicholos Johns, PA-C    Tyler Pita, MD  [facility] Direct Dial: [phone]   Fax: [phone] [email or web address]   Skype   LinkedIn

## 2021-04-11 NOTE — Progress Notes (Signed)
Called to introduced myself to the patient as the prostate nurse navigator. He saw Dr. Tammi Klippel and Ailene Ards on 04/10/2021.  Pt in agreement to start treatment with 5.5-8 weeks of external beam therapy with ADT.  He prefers  to have his radiation treatment at [facility] in [place].  Referral has been placed to Dr. Sharyn Dross at [facility], to discuss prostate IMRT closer to his home.  I notified the patient we will ensure referral was received.  He is scheduled to see Dr. Junious Silk on 05/15/2021 and is aware of this appointment.  No current needs at this time, pt given my contact information.

## 2021-04-15 ENCOUNTER — Telehealth: Payer: Self-pay | Admitting: *Deleted

## 2021-04-15 NOTE — Telephone Encounter (Signed)
CALLED PATIENT TO INFORM OF [place] VISIT IN [place] ON 04-18-21 @ 1:15 PM, SPOKE WITH PATIENT AND HE IS AWARE OF THIS APPT.

## 2021-04-22 ENCOUNTER — Telehealth: Payer: Self-pay | Admitting: Urology

## 2021-04-22 NOTE — Telephone Encounter (Signed)
I spoke with Dr. Lynnette Caffey and went over the patient's radiation plan.  We both discussed the thyroid and the biopsy and it should not affect his prostate cancer treatment.  Based on newer data, Dr. Lynnette Caffey would like to start radiation immediately after Eligard begins and therefore I will send a note to get patient in for nurse visit for Eligard 45 mg and I turned in a scheduling sheet to schedule SpaceOAR and gold seeds.

## 2021-04-23 ENCOUNTER — Telehealth: Payer: Self-pay

## 2021-04-23 NOTE — Telephone Encounter (Signed)
-----   Message from Festus Aloe, MD sent at 04/22/2021  1:00 PM EST ----- William Joseph-this patient needs to get in as soon as possible for a NV for Eligard 45 mg injection.  He then needs to see me for an office visit in 3 months.  Thank you.

## 2021-04-23 NOTE — Telephone Encounter (Signed)
Patient made aware and appointment scheduled.

## 2021-04-25 NOTE — Progress Notes (Signed)
RN followed up to ensure referral to Dr. Lynnette Caffey was received. Pt had consult with Dr. Lynnette Caffey on 04/18/2021.  Pt scheduled to receive first ADT injection on 04/26/2021.

## 2021-04-26 ENCOUNTER — Ambulatory Visit (INDEPENDENT_AMBULATORY_CARE_PROVIDER_SITE_OTHER): Payer: Federal, State, Local not specified - PPO

## 2021-04-26 ENCOUNTER — Other Ambulatory Visit: Payer: Self-pay

## 2021-04-26 DIAGNOSIS — C61 Malignant neoplasm of prostate: Secondary | ICD-10-CM | POA: Diagnosis not present

## 2021-04-26 MED ORDER — LEUPROLIDE ACETATE (6 MONTH) 45 MG ~~LOC~~ KIT
45.0000 mg | PACK | Freq: Once | SUBCUTANEOUS | Status: AC
  Administered 2021-04-26: 45 mg via SUBCUTANEOUS

## 2021-04-26 NOTE — Progress Notes (Addendum)
Megan with [employer] notified of Eligard started.    Eligard SubQ Injection   Due to Prostate Cancer patient is present today for a Eligard Injection.  Medication: Eligard 6 month Dose: 45 mg  Location: right  Lot: [id number]  Exp: [id number]  Patient tolerated well, no complications were noted  Performed by: Estill Bamberg RN

## 2021-04-29 ENCOUNTER — Telehealth: Payer: Self-pay

## 2021-04-29 ENCOUNTER — Other Ambulatory Visit: Payer: Self-pay

## 2021-04-29 DIAGNOSIS — C61 Malignant neoplasm of prostate: Secondary | ICD-10-CM

## 2021-04-29 NOTE — Progress Notes (Signed)
[facility] [place] Surgery Posting Form   Surgery Date/Time: Date: 05/06/2021  Surgeon: Dr. Nicolette Bang, MD  Surgery Location: Day Surgery  Inpt ( No  )   Outpt (Yes)   Obs ( No  )   Diagnosis: C61 Prostate Cancer  -CPT: 30104,04591, [id number], [id number]  Surgery: Placement of gold seeds and space oar instillation  Stop Anticoagulations: Yes  Cardiac/Medical/Pulmonary Clearance needed: no  *Orders entered into EPIC  Date: 04/29/21   *Case booked in [place]  Date: 04/29/21  *Notified pt of Surgery: Date: 04/29/21  *Placed into Prior Authorization Work Fabio Bering Date: 04/29/21   Assistant/laser/rep:No

## 2021-04-29 NOTE — Telephone Encounter (Signed)
I spoke with Mr. William Joseph. We have discussed possible surgery dates and Monday January 30th, 2023 was agreed upon by all parties. Patient given information about surgery date, what to expect pre-operatively and post operatively.   We discussed that a pre-op nurse will be calling to set up the pre-op visit that will take place prior to surgery. Informed patient that our office will communicate any additional care to be provided after surgery. Patients questions or concerns were discussed during our call.   Advised to call our office should there be any additional information, questions or concerns that arise. Patient verbalized understanding.

## 2021-04-30 ENCOUNTER — Other Ambulatory Visit: Payer: Self-pay | Admitting: Urology

## 2021-04-30 DIAGNOSIS — C61 Malignant neoplasm of prostate: Secondary | ICD-10-CM

## 2021-04-30 NOTE — Patient Instructions (Signed)
William Joseph  04/30/2021     @PREFPERIOPPHARMACY @   Your procedure is scheduled on  05/06/2021.   Report to [facility] at  1100  A.M.   Call this number if you have problems the morning of surgery:  [phone]   Remember:  Do not eat or drink after midnight.      Take these medicines the morning of surgery with A SIP OF WATER                                        avodart     Do not wear jewelry, make-up or nail polish.  Do not wear lotions, powders, or perfumes, or deodorant.  Do not shave 48 hours prior to surgery.  Men may shave face and neck.  Do not bring valuables to the hospital.  [facility] is not responsible for any belongings or valuables.  Contacts, dentures or bridgework may not be worn into surgery.  Leave your suitcase in the car.  After surgery it may be brought to your room.  For patients admitted to the hospital, discharge time will be determined by your treatment team.  Patients discharged the day of surgery will not be allowed to drive home and must have someone with them for 24 hours.    Special instructions:   DO NOT smoke tobacco or vape for 24 hours before your procedure.  Please read over the following fact sheets that you were given. Coughing and Deep Breathing, Surgical Site Infection Prevention, Anesthesia Post-op Instructions, and Care and Recovery After Surgery          Transrectal Ultrasound-Guided Prostate Gold Seed Placement, Care After The following information offers guidance on how to care for yourself after your procedure. Your health care provider may also give you more specific instructions. If you have problems or questions, contact your health care provider. What can I expect after the procedure? After the procedure, it is common to have: Light bleeding from the rectum. Bruising or tenderness in the area behind the scrotum (perineum), if the needle was put into your prostate through this area. Small amounts  of blood in your urine. This should only last for a few days. Light brown or red semen. This may last for a couple of weeks. Follow these instructions at home: Medicines  Take over-the-counter and prescription medicines only as told by your health care provider. If you were prescribed an antibiotic, take it as told by your health care provider. Do not stop taking the antibiotic early, even if you start to feel better. Ask your health care provider if the medicine prescribed to you requires you to avoid driving or using machinery. Eating and drinking Follow instructions from your health care provider about eating or drinking restrictions. Drink enough fluid to keep your urine pale yellow. Managing pain and swelling If directed, put ice on the affected area. To do this: Put ice in a plastic bag. Place a towel between your skin and the bag. Leave the ice on for 20 minutes, 2-3 times a day. Be sure to remove the ice if your skin turns bright red. If you cannot feel pain, heat, or cold, you have a greater risk of damage to the area. Try not to sit directly on the area behind the scrotum. A soft cushion can help with discomfort. Activity If you were given a sedative  during the procedure, it can affect you for several hours. Do not drive or operate machinery until your health care provider says that it is safe. Return to your normal activities as told by your health care provider. Ask your health care provider what activities are safe for you. Follow instructions from your health care provider about when it is safe for you to engage in sexual activity. General instructions Plan to have a responsible adult care for you for the time you are told after you leave the hospital or clinic. Do not take baths, swim, or use a hot tub until your health care provider approves. Ask your health care provider if you may take showers. You may only be allowed to take sponge baths. Keep all follow-up  visits. Contact a health care provider if: You have a fever or chills. You have more blood in your urine. You have blood in your urine for more than 2-3 days after the procedure. You have trouble passing urine or having a bowel movement. You have pain or burning when urinating. You have nausea or you vomit. Get help right away if: You have severe pain that does not get better with medicine. Your urine is bright red. You cannot urinate. You have rectal bleeding that gets worse. You have shortness of breath. Summary After the procedure, you may have blood in your urine and light bleeding from the rectum. Return to your normal activities as told by your health care provider. Ask your health care provider what activities are safe for you. Take over-the-counter and prescription medicines only as told by your health care provider. Contact your health care provider right away if your urine is bright red or you cannot pass urine. This information is not intended to replace advice given to you by your health care provider. Make sure you discuss any questions you have with your health care provider. Document Revised: 06/20/2020 Document Reviewed: 06/20/2020 Elsevier Patient Education  [place] Anesthesia, Adult, Care After This sheet gives you information about how to care for yourself after your procedure. Your health care provider may also give you more specific instructions. If you have problems or questions, contact your health care provider. What can I expect after the procedure? After the procedure, the following side effects are common: Pain or discomfort at the IV site. Nausea. Vomiting. Sore throat. Trouble concentrating. Feeling cold or chills. Feeling weak or tired. Sleepiness and fatigue. Soreness and body aches. These side effects can affect parts of the body that were not involved in surgery. Follow these instructions at home: For the time period you were  told by your health care provider:  Rest. Do not participate in activities where you could fall or become injured. Do not drive or use machinery. Do not drink alcohol. Do not take sleeping pills or medicines that cause drowsiness. Do not make important decisions or sign legal documents. Do not take care of children on your own. Eating and drinking Follow any instructions from your health care provider about eating or drinking restrictions. When you feel hungry, start by eating small amounts of foods that are soft and easy to digest (bland), such as toast. Gradually return to your regular diet. Drink enough fluid to keep your urine pale yellow. If you vomit, rehydrate by drinking water, juice, or clear broth. General instructions If you have sleep apnea, surgery and certain medicines can increase your risk for breathing problems. Follow instructions from your health care provider about wearing your sleep  device: Anytime you are sleeping, including during daytime naps. While taking prescription pain medicines, sleeping medicines, or medicines that make you drowsy. Have a responsible adult stay with you for the time you are told. It is important to have someone help care for you until you are awake and alert. Return to your normal activities as told by your health care provider. Ask your health care provider what activities are safe for you. Take over-the-counter and prescription medicines only as told by your health care provider. If you smoke, do not smoke without supervision. Keep all follow-up visits as told by your health care provider. This is important. Contact a health care provider if: You have nausea or vomiting that does not get better with medicine. You cannot eat or drink without vomiting. You have pain that does not get better with medicine. You are unable to pass urine. You develop a skin rash. You have a fever. You have redness around your IV site that gets worse. Get help  right away if: You have difficulty breathing. You have chest pain. You have blood in your urine or stool, or you vomit blood. Summary After the procedure, it is common to have a sore throat or nausea. It is also common to feel tired. Have a responsible adult stay with you for the time you are told. It is important to have someone help care for you until you are awake and alert. When you feel hungry, start by eating small amounts of foods that are soft and easy to digest (bland), such as toast. Gradually return to your regular diet. Drink enough fluid to keep your urine pale yellow. Return to your normal activities as told by your health care provider. Ask your health care provider what activities are safe for you. This information is not intended to replace advice given to you by your health care provider. Make sure you discuss any questions you have with your health care provider. Document Revised: 12/08/2019 Document Reviewed: 07/07/2019 Elsevier Patient Education  2022 [employer]. How to Use Chlorhexidine for Bathing Chlorhexidine gluconate (CHG) is a germ-killing (antiseptic) solution that is used to clean the skin. It can get rid of the bacteria that normally live on the skin and can keep them away for about 24 hours. To clean your skin with CHG, you may be given: A CHG solution to use in the shower or as part of a sponge bath. A prepackaged cloth that contains CHG. Cleaning your skin with CHG may help lower the risk for infection: While you are staying in the intensive care unit of the hospital. If you have a vascular access, such as a central line, to provide short-term or long-term access to your veins. If you have a catheter to drain urine from your bladder. If you are on a ventilator. A ventilator is a machine that helps you breathe by moving air in and out of your lungs. After surgery. What are the risks? Risks of using CHG include: A skin reaction. Hearing loss, if CHG gets  in your ears and you have a perforated eardrum. Eye injury, if CHG gets in your eyes and is not rinsed out. The CHG product catching fire. Make sure that you avoid smoking and flames after applying CHG to your skin. Do not use CHG: If you have a chlorhexidine allergy or have previously reacted to chlorhexidine. On babies younger than 66 months of age. How to use CHG solution Use CHG only as told by your health care provider,  and follow the instructions on the label. Use the full amount of CHG as directed. Usually, this is one bottle. During a shower Follow these steps when using CHG solution during a shower (unless your health care provider gives you different instructions): Start the shower. Use your normal soap and shampoo to wash your face and hair. Turn off the shower or move out of the shower stream. Pour the CHG onto a clean washcloth. Do not use any type of brush or rough-edged sponge. Starting at your neck, lather your body down to your toes. Make sure you follow these instructions: If you will be having surgery, pay special attention to the part of your body where you will be having surgery. Scrub this area for at least 1 minute. Do not use CHG on your head or face. If the solution gets into your ears or eyes, rinse them well with water. Avoid your genital area. Avoid any areas of skin that have broken skin, cuts, or scrapes. Scrub your back and under your arms. Make sure to wash skin folds. Let the lather sit on your skin for 1-2 minutes or as long as told by your health care provider. Thoroughly rinse your entire body in the shower. Make sure that all body creases and crevices are rinsed well. Dry off with a clean towel. Do not put any substances on your body afterward--such as powder, lotion, or perfume--unless you are told to do so by your health care provider. Only use lotions that are recommended by the manufacturer. Put on clean clothes or pajamas. If it is the night before  your surgery, sleep in clean sheets.  During a sponge bath Follow these steps when using CHG solution during a sponge bath (unless your health care provider gives you different instructions): Use your normal soap and shampoo to wash your face and hair. Pour the CHG onto a clean washcloth. Starting at your neck, lather your body down to your toes. Make sure you follow these instructions: If you will be having surgery, pay special attention to the part of your body where you will be having surgery. Scrub this area for at least 1 minute. Do not use CHG on your head or face. If the solution gets into your ears or eyes, rinse them well with water. Avoid your genital area. Avoid any areas of skin that have broken skin, cuts, or scrapes. Scrub your back and under your arms. Make sure to wash skin folds. Let the lather sit on your skin for 1-2 minutes or as long as told by your health care provider. Using a different clean, wet washcloth, thoroughly rinse your entire body. Make sure that all body creases and crevices are rinsed well. Dry off with a clean towel. Do not put any substances on your body afterward--such as powder, lotion, or perfume--unless you are told to do so by your health care provider. Only use lotions that are recommended by the manufacturer. Put on clean clothes or pajamas. If it is the night before your surgery, sleep in clean sheets. How to use CHG prepackaged cloths Only use CHG cloths as told by your health care provider, and follow the instructions on the label. Use the CHG cloth on clean, dry skin. Do not use the CHG cloth on your head or face unless your health care provider tells you to. When washing with the CHG cloth: Avoid your genital area. Avoid any areas of skin that have broken skin, cuts, or scrapes. Before surgery Follow these  steps when using a CHG cloth to clean before surgery (unless your health care provider gives you different instructions): Using the CHG  cloth, vigorously scrub the part of your body where you will be having surgery. Scrub using a back-and-forth motion for 3 minutes. The area on your body should be completely wet with CHG when you are done scrubbing. Do not rinse. Discard the cloth and let the area air-dry. Do not put any substances on the area afterward, such as powder, lotion, or perfume. Put on clean clothes or pajamas. If it is the night before your surgery, sleep in clean sheets.  For general bathing Follow these steps when using CHG cloths for general bathing (unless your health care provider gives you different instructions). Use a separate CHG cloth for each area of your body. Make sure you wash between any folds of skin and between your fingers and toes. Wash your body in the following order, switching to a new cloth after each step: The front of your neck, shoulders, and chest. Both of your arms, under your arms, and your hands. Your stomach and groin area, avoiding the genitals. Your right leg and foot. Your left leg and foot. The back of your neck, your back, and your buttocks. Do not rinse. Discard the cloth and let the area air-dry. Do not put any substances on your body afterward--such as powder, lotion, or perfume--unless you are told to do so by your health care provider. Only use lotions that are recommended by the manufacturer. Put on clean clothes or pajamas. Contact a health care provider if: Your skin gets irritated after scrubbing. You have questions about using your solution or cloth. You swallow any chlorhexidine. Call your local poison control center (1-954-725-3578 in the [place] Get help right away if: Your eyes itch badly, or they become very red or swollen. Your skin itches badly and is red or swollen. Your hearing changes. You have trouble seeing. You have swelling or tingling in your mouth or throat. You have trouble breathing. These symptoms may represent a serious problem that is an  emergency. Do not wait to see if the symptoms will go away. Get medical help right away. Call your local emergency services (911 in the [place] Do not drive yourself to the hospital. Summary Chlorhexidine gluconate (CHG) is a germ-killing (antiseptic) solution that is used to clean the skin. Cleaning your skin with CHG may help to lower your risk for infection. You may be given CHG to use for bathing. It may be in a bottle or in a prepackaged cloth to use on your skin. Carefully follow your health care provider's instructions and the instructions on the product label. Do not use CHG if you have a chlorhexidine allergy. Contact your health care provider if your skin gets irritated after scrubbing. This information is not intended to replace advice given to you by your health care provider. Make sure you discuss any questions you have with your health care provider. Document Revised: 06/04/2020 Document Reviewed: 06/04/2020 Elsevier Patient Education  2022 [employer].

## 2021-05-02 ENCOUNTER — Encounter (HOSPITAL_COMMUNITY)
Admission: RE | Admit: 2021-05-02 | Discharge: 2021-05-02 | Disposition: A | Discharge status: Home / Self Care | Payer: Federal, State, Local not specified - PPO | Source: Ambulatory Visit | Attending: Urology | Admitting: Urology

## 2021-05-02 ENCOUNTER — Encounter (HOSPITAL_COMMUNITY): Payer: Self-pay

## 2021-05-02 DIAGNOSIS — Z8572 Personal history of non-Hodgkin lymphomas: Secondary | ICD-10-CM | POA: Diagnosis not present

## 2021-05-02 DIAGNOSIS — R7303 Prediabetes: Secondary | ICD-10-CM | POA: Insufficient documentation

## 2021-05-02 DIAGNOSIS — D649 Anemia, unspecified: Secondary | ICD-10-CM | POA: Diagnosis not present

## 2021-05-02 DIAGNOSIS — Z01812 Encounter for preprocedural laboratory examination: Secondary | ICD-10-CM | POA: Insufficient documentation

## 2021-05-02 HISTORY — DX: Disorder of thyroid, unspecified: E07.9

## 2021-05-02 LAB — BASIC METABOLIC PANEL
Anion gap: 9 (ref 5–15)
BUN: 13 mg/dL (ref 8–23)
CO2: 22 mmol/L (ref 22–32)
Calcium: 9.1 mg/dL (ref 8.9–10.3)
Chloride: 108 mmol/L (ref 98–111)
Creatinine, Ser: 1.01 mg/dL (ref 0.61–1.24)
GFR, Estimated: >60 mL/min (ref >60)
Glucose, Bld: 100 mg/dL — ABNORMAL HIGH (ref 70–99)
Potassium: 4.1 mmol/L (ref 3.5–5.1)
Sodium: 139 mmol/L (ref 135–145)

## 2021-05-02 LAB — CBC WITH DIFFERENTIAL/PLATELET
Abs Immature Granulocytes: 0.01 10*3/uL (ref 0.00–0.07)
Basophils Absolute: 0 10*3/uL (ref 0.0–0.1)
Basophils Relative: 0 %
Eosinophils Absolute: 0.1 10*3/uL (ref 0.0–0.5)
Eosinophils Relative: 3 %
HCT: 40.6 % (ref 39.0–52.0)
Hemoglobin: 12.5 g/dL — ABNORMAL LOW (ref 13.0–17.0)
Immature Granulocytes: 0 %
Lymphocytes Relative: 44 %
Lymphs Abs: 2.2 10*3/uL (ref 0.7–4.0)
MCH: 29.6 pg (ref 26.0–34.0)
MCHC: 30.8 g/dL (ref 30.0–36.0)
MCV: 96 fL (ref 80.0–100.0)
Monocytes Absolute: 0.5 10*3/uL (ref 0.1–1.0)
Monocytes Relative: 10 %
Neutro Abs: 2.2 10*3/uL (ref 1.7–7.7)
Neutrophils Relative %: 43 %
Platelets: 163 10*3/uL (ref 150–400)
RBC: 4.23 MIL/uL (ref 4.22–5.81)
RDW: 14.1 % (ref 11.5–15.5)
WBC: 5.1 10*3/uL (ref 4.0–10.5)
nRBC: 0 % (ref 0.0–0.2)

## 2021-05-03 LAB — HEMOGLOBIN A1C
Hgb A1c MFr Bld: 6.2 % — ABNORMAL HIGH (ref 4.8–5.6)
Mean Plasma Glucose: 131 mg/dL

## 2021-05-06 ENCOUNTER — Ambulatory Visit (HOSPITAL_COMMUNITY): Payer: Federal, State, Local not specified - PPO | Admitting: Anesthesiology

## 2021-05-06 ENCOUNTER — Ambulatory Visit (HOSPITAL_COMMUNITY)
Admission: RE | Admit: 2021-05-06 | Discharge: 2021-05-06 | Disposition: A | Discharge status: Home / Self Care | Payer: Federal, State, Local not specified - PPO | Attending: Urology | Admitting: Urology

## 2021-05-06 ENCOUNTER — Ambulatory Visit (HOSPITAL_COMMUNITY): Payer: Federal, State, Local not specified - PPO

## 2021-05-06 ENCOUNTER — Ambulatory Visit (HOSPITAL_COMMUNITY)
Admission: RE | Admit: 2021-05-06 | Payer: Federal, State, Local not specified - PPO | Source: Ambulatory Visit | Admitting: Urology

## 2021-05-06 ENCOUNTER — Encounter (HOSPITAL_COMMUNITY)
Admission: RE | Disposition: A | Discharge status: Home / Self Care | Payer: Self-pay | Source: Home / Self Care | Attending: Urology

## 2021-05-06 ENCOUNTER — Encounter (HOSPITAL_COMMUNITY): Payer: Self-pay | Admitting: Urology

## 2021-05-06 ENCOUNTER — Other Ambulatory Visit: Payer: Self-pay

## 2021-05-06 DIAGNOSIS — F32A Depression, unspecified: Secondary | ICD-10-CM | POA: Diagnosis not present

## 2021-05-06 DIAGNOSIS — I10 Essential (primary) hypertension: Secondary | ICD-10-CM | POA: Diagnosis not present

## 2021-05-06 DIAGNOSIS — G4730 Sleep apnea, unspecified: Secondary | ICD-10-CM | POA: Insufficient documentation

## 2021-05-06 DIAGNOSIS — R7303 Prediabetes: Secondary | ICD-10-CM

## 2021-05-06 DIAGNOSIS — Z8042 Family history of malignant neoplasm of prostate: Secondary | ICD-10-CM | POA: Insufficient documentation

## 2021-05-06 DIAGNOSIS — F419 Anxiety disorder, unspecified: Secondary | ICD-10-CM | POA: Diagnosis not present

## 2021-05-06 DIAGNOSIS — Z8572 Personal history of non-Hodgkin lymphomas: Secondary | ICD-10-CM | POA: Insufficient documentation

## 2021-05-06 DIAGNOSIS — F17290 Nicotine dependence, other tobacco product, uncomplicated: Secondary | ICD-10-CM | POA: Insufficient documentation

## 2021-05-06 DIAGNOSIS — D649 Anemia, unspecified: Secondary | ICD-10-CM

## 2021-05-06 DIAGNOSIS — G709 Myoneural disorder, unspecified: Secondary | ICD-10-CM | POA: Diagnosis not present

## 2021-05-06 DIAGNOSIS — C61 Malignant neoplasm of prostate: Secondary | ICD-10-CM | POA: Diagnosis present

## 2021-05-06 HISTORY — PX: SPACE OAR INSTILLATION: SHX6769

## 2021-05-06 HISTORY — PX: GOLD SEED IMPLANT: SHX6343

## 2021-05-06 LAB — GLUCOSE, CAPILLARY: Glucose-Capillary: 100 mg/dL — ABNORMAL HIGH (ref 70–99)

## 2021-05-06 SURGERY — INSERTION, GOLD SEEDS
Anesthesia: General | Site: Prostate

## 2021-05-06 MED ORDER — FENTANYL CITRATE (PF) 100 MCG/2ML IJ SOLN
INTRAMUSCULAR | Status: DC | PRN
  Administered 2021-05-06 (×2): 50 ug via INTRAVENOUS

## 2021-05-06 MED ORDER — ONDANSETRON HCL 4 MG/2ML IJ SOLN
INTRAMUSCULAR | Status: AC
  Filled 2021-05-06: qty 2

## 2021-05-06 MED ORDER — CEFAZOLIN SODIUM-DEXTROSE 2-4 GM/100ML-% IV SOLN
2.0000 g | INTRAVENOUS | Status: AC
  Administered 2021-05-06: 2 g via INTRAVENOUS
  Filled 2021-05-06: qty 100

## 2021-05-06 MED ORDER — PROPOFOL 10 MG/ML IV BOLUS
INTRAVENOUS | Status: AC
  Filled 2021-05-06: qty 20

## 2021-05-06 MED ORDER — HYDROMORPHONE HCL 1 MG/ML IJ SOLN: 0.2500 mg | INTRAMUSCULAR | Status: DC | PRN

## 2021-05-06 MED ORDER — PROPOFOL 10 MG/ML IV BOLUS
INTRAVENOUS | Status: DC | PRN
  Administered 2021-05-06: 200 mg via INTRAVENOUS

## 2021-05-06 MED ORDER — ORAL CARE MOUTH RINSE: 15.0000 mL | Freq: Once | OROMUCOSAL | Status: AC

## 2021-05-06 MED ORDER — FENTANYL CITRATE (PF) 100 MCG/2ML IJ SOLN
INTRAMUSCULAR | Status: AC
  Filled 2021-05-06: qty 2

## 2021-05-06 MED ORDER — LACTATED RINGERS IV SOLN: INTRAVENOUS | Status: DC

## 2021-05-06 MED ORDER — DEXAMETHASONE SODIUM PHOSPHATE 10 MG/ML IJ SOLN
INTRAMUSCULAR | Status: AC
  Filled 2021-05-06: qty 1

## 2021-05-06 MED ORDER — CHLORHEXIDINE GLUCONATE 0.12 % MT SOLN
15.0000 mL | Freq: Once | OROMUCOSAL | Status: AC
  Administered 2021-05-06: 15 mL via OROMUCOSAL

## 2021-05-06 MED ORDER — ONDANSETRON HCL 4 MG/2ML IJ SOLN: 4.0000 mg | Freq: Once | INTRAMUSCULAR | Status: DC | PRN

## 2021-05-06 MED ORDER — TRAMADOL HCL 50 MG PO TABS: 50.0000 mg | ORAL_TABLET | Freq: Four times a day (QID) | ORAL | 0 refills | Status: DC | PRN

## 2021-05-06 MED ORDER — 0.9 % SODIUM CHLORIDE (POUR BTL) OPTIME
TOPICAL | Status: DC | PRN
  Administered 2021-05-06: 1000 mL

## 2021-05-06 MED ORDER — PHENYLEPHRINE 40 MCG/ML (10ML) SYRINGE FOR IV PUSH (FOR BLOOD PRESSURE SUPPORT)
PREFILLED_SYRINGE | INTRAVENOUS | Status: AC
  Filled 2021-05-06: qty 20

## 2021-05-06 MED ORDER — DEXAMETHASONE SODIUM PHOSPHATE 10 MG/ML IJ SOLN
INTRAMUSCULAR | Status: DC | PRN
Start: 2021-05-06 — End: 2021-05-06
  Administered 2021-05-06: 10 mg via INTRAVENOUS

## 2021-05-06 MED ORDER — ONDANSETRON HCL 4 MG/2ML IJ SOLN
INTRAMUSCULAR | Status: DC | PRN
Start: 2021-05-06 — End: 2021-05-06
  Administered 2021-05-06: 4 mg via INTRAVENOUS

## 2021-05-06 SURGICAL SUPPLY — 24 items
COVER BACK TABLE 60X90IN (DRAPES) ×2 IMPLANT
DRAPE LEGGINS SURG 28X43 STRL (DRAPES) ×2 IMPLANT
DRAPE SURG 17X23 STRL (DRAPES) ×2 IMPLANT
DRSG TEGADERM 8X12 (GAUZE/BANDAGES/DRESSINGS) ×2 IMPLANT
GAUZE SPONGE 4X4 12PLY STRL (GAUZE/BANDAGES/DRESSINGS) ×2 IMPLANT
GLOVE SRG 8 PF TXTR STRL LF DI (GLOVE) ×1 IMPLANT
GLOVE SURG POLYISO LF SZ8 (GLOVE) ×2 IMPLANT
GLOVE SURG UNDER POLY LF SZ7 (GLOVE) ×2 IMPLANT
GLOVE SURG UNDER POLY LF SZ7.5 (GLOVE) ×2 IMPLANT
GLOVE SURG UNDER POLY LF SZ8 (GLOVE) ×2
GOWN STRL REUS W/TWL LRG LVL3 (GOWN DISPOSABLE) ×2 IMPLANT
GOWN STRL REUS W/TWL XL LVL3 (GOWN DISPOSABLE) ×2 IMPLANT
IMPL SPACEOAR SYSTEM 10ML (Spacer) ×1 IMPLANT
IMPLANT SPACEOAR SYSTEM 10ML (Spacer) ×2 IMPLANT
KIT TURNOVER CYSTO (KITS) ×2 IMPLANT
MARKER GOLD PRELOAD 1.2X3 (Urological Implant) ×1 IMPLANT
NS IRRIG 1000ML POUR BTL (IV SOLUTION) ×2 IMPLANT
PAD ARMBOARD 7.5X6 YLW CONV (MISCELLANEOUS) ×4 IMPLANT
SEED GOLD PRELOAD 1.2X3 (Urological Implant) ×6 IMPLANT
SURGILUBE 2OZ TUBE FLIPTOP (MISCELLANEOUS) ×2 IMPLANT
SYR CONTROL 10ML LL (SYRINGE) ×2 IMPLANT
TOWEL NATURAL 4PK STERILE (DISPOSABLE) ×2 IMPLANT
UNDERPAD 30X36 HEAVY ABSORB (UNDERPADS AND DIAPERS) ×2 IMPLANT
WATER STERILE IRR 500ML POUR (IV SOLUTION) ×2 IMPLANT

## 2021-05-06 NOTE — Transfer of Care (Signed)
Immediate Anesthesia Transfer of Care Note  Patient: William Joseph  Procedure(s) Performed: GOLD SEED IMPLANT (Prostate) SPACE OAR INSTILLATION (Prostate)  Patient Location: PACU  Anesthesia Type:General  Level of Consciousness: awake, alert , oriented and patient cooperative  Airway & Oxygen Therapy: Patient Spontanous Breathing  Post-op Assessment: Report given to RN, Post -op Vital signs reviewed and stable and Patient moving all extremities X 4  Post vital signs: Reviewed and stable  Last Vitals:  Vitals Value Taken Time  BP    Temp    Pulse 69 05/06/21 1325  Resp    SpO2 100 % 05/06/21 1325  Vitals shown include unvalidated device data.  Last Pain:  Vitals:   05/06/21 1118  TempSrc: Oral  PainSc: 0-No pain         Complications: No notable events documented.

## 2021-05-06 NOTE — Anesthesia Procedure Notes (Signed)
Procedure Name: LMA Insertion Date/Time: 05/06/2021 12:51 PM Performed by: Karna Dupes, CRNA Pre-anesthesia Checklist: Patient identified, Emergency Drugs available, Suction available and Patient being monitored Patient Re-evaluated:Patient Re-evaluated prior to induction Oxygen Delivery Method: Circle system utilized Preoxygenation: Pre-oxygenation with 100% oxygen Induction Type: IV induction LMA: LMA inserted LMA Size: 5.0 Number of attempts: 2 (LMA 4 did not seat well; exchanged for LMA 5) Tube secured with: Tape Dental Injury: Teeth and Oropharynx as per pre-operative assessment

## 2021-05-06 NOTE — Anesthesia Postprocedure Evaluation (Signed)
Anesthesia Post Note  Patient: William Joseph  Procedure(s) Performed: GOLD SEED IMPLANT (Prostate) SPACE OAR INSTILLATION (Prostate)  Patient location during evaluation: Phase II Anesthesia Type: General Level of consciousness: awake and alert and oriented Pain management: pain level controlled Vital Signs Assessment: post-procedure vital signs reviewed and stable Respiratory status: spontaneous breathing, nonlabored ventilation and respiratory function stable Cardiovascular status: blood pressure returned to baseline and stable Postop Assessment: no apparent nausea or vomiting Anesthetic complications: no   No notable events documented.   Last Vitals:  Vitals:   05/06/21 1400 05/06/21 1420  BP: (!) 199/86   Pulse: (!) 46 (!) 49  Resp: 13 17  Temp:  36.9 C  SpO2: 100% 100%    Last Pain:  Vitals:   05/06/21 1420  TempSrc: Oral  PainSc: 0-No pain                 Concha Sudol C Sun Wilensky

## 2021-05-06 NOTE — Op Note (Signed)
PRE-OPERATIVE DIAGNOSIS:  Adenocarcinoma of the prostate  POST-OPERATIVE DIAGNOSIS:  Same  PROCEDURE: 1. Prostate Ultrasound 2. Placement of fiducial marks 3. Placement of SpaceOAR  SURGEON:  Surgeon(s): William Bang, MD  ANESTHESIA:  General  EBL:  Minimal  DRAINS: none  FINDINGS: 33.7cc prostate.   INDICATION: William Joseph is a 76 year old with a history of T1c prostate cancer who is scheduled to undergo IMRT. He wishes to have fiducial markers and SpaceOAR placed prior to IMRT to decrease rectal toxicity.  Description of procedure: After informed consent the patient was brought to the major OR, placed on the table and administered general anesthesia. He was then moved to the modified lithotomy position with his perineum perpendicular to the floor. His perineum and genitalia were then sterilely prepped. An official timeout was then performed. The transrectal ultrasound probe was placed in the rectum and affixed to the stand. He was then sterilely draped.  A transrectal ultrasound of the prostate was performed.  Lidocaine  was not  instilled using ultrasound guidance into the junction of each seminal vesicle of the prostate.  3 Gold markers were placed into the prostate using the standard template and ultrasound guidance.  Accurate placement of the markers was confirmed.  We then proceeded to mix the SpaceOAR using the kit supplied from the manufacturer. Once this was complete we placed a sinal needle into the perirectal fat between the rectum and the prostate. Once this was accomplished we injected 2cc of normal saline to hydrodissect the plain. We then instilled the the SpaceOAR through the spinal needle and noted good distribution in the perirectal fat.   The patient was awakened and taken to recovery room in stable and satisfactory condition. He tolerated procedure well and there were no intraoperative complications.  CONDITION: Stable, extubated, transferred to  PACU  PLAN: The patient is to be discharged home and he will start IMRT in the next 2-3 weeks

## 2021-05-06 NOTE — H&P (Signed)
Urology Admission H&P  Chief Complaint: Prostate Cancer  History of Present Illness: William Joseph is a 76yo here for fiducial markers and SpaceOAR. He was diagnosed with intermediate to high risk prostate cancer November 2022.  Here to review biopsy results. His Oct 2022 PSA was 10.1 (20.2). He is also on dutasteride. His prostate is small and benign on exam. His dad had PCa.His dad in on Lupron and 33 yo.    Biopsy: November 2022-intermediate to high risk PSA 10.1 (20.2)-dutasteride use T1c (hypoechoic areas were noted in the left prostate on ultrasound) Prostate 25 g Gleason 4+3=7, 3 cores, 60 to 80%, left Gleason 3+4=7, 2 cores, 20 to 40%, left 5/12  Past Medical History:  Diagnosis Date   Anxiety    Essential hypertension, benign    History of cardiac catheterization    Normal coronaries March 2011   History of non-Hodgkin's lymphoma 06/26/2009   Qualifier: Diagnosis of  By: Dwaine Gale, CNA, [place]     Non Hodgkin's lymphoma ([place])    Nonerosive nonspecific gastritis    EGD - Dr. Laural Golden   Obsessive-compulsive disorder    Palpitations    Sleep apnea    Thyroid mass    Past Surgical History:  Procedure Laterality Date   COLONOSCOPY N/A 06/29/2015   Procedure: COLONOSCOPY;  Surgeon: Daneil Dolin, MD;  [facility];  Service: Endoscopy;  Laterality: N/A;  9:30 Am - moved to 9:15 - office to notify   ESOPHAGOGASTRODUODENOSCOPY ENDOSCOPY N/A    EXPLORATORY LAPAROTOMY     GLAUCOMA SURGERY Bilateral 11/2013   didnt work   LYMPH NODE BIOPSY     POLYPECTOMY  06/29/2015   Procedure: POLYPECTOMY;  Surgeon: Daneil Dolin, MD;  [facility];  Service: Endoscopy;;  descending colon polyp removed via cold snare   Port-a-cath placement     Tooth implant      Home Medications:  Current Facility-Administered Medications  Medication Dose Route Frequency Provider Last Rate Last Admin   ceFAZolin (ANCEF) IVPB 2g/100 mL premix  2 g Intravenous 30 min Pre-Op Festus Aloe, MD       lactated ringers infusion   Intravenous Continuous Denese Killings, MD 50 mL/hr at 05/06/21 1124 New Bag at 05/06/21 1124   Allergies:  Allergies  Allergen Reactions   Tomato Itching    Family History  Problem Relation Age of Onset   Diabetes Father    Coronary artery disease Mother    Hypertension Brother    Mental illness Maternal Uncle    Dementia Maternal Grandfather    Seizures Cousin    Drug abuse Other    Seizures Maternal Uncle    ADD / ADHD Neg Hx    Alcohol abuse Neg Hx    Anxiety disorder Neg Hx    Bipolar disorder Neg Hx    Depression Neg Hx    OCD Neg Hx    Paranoid behavior Neg Hx    Schizophrenia Neg Hx    Sexual abuse Neg Hx    Physical abuse Neg Hx    Social History:  reports that he has been smoking cigars and cigarettes. He started smoking about 56 years ago. He has a 1.00 pack-year smoking history. He has never used smokeless tobacco. He reports current alcohol use. He reports that he does not use drugs.  Review of Systems  All other systems reviewed and are negative.  Physical Exam:  Vital signs in last 24 hours: Temp:  [98 F (36.7 C)]  98 F (36.7 C) (01/30 1118) Pulse Rate:  [66] 66 (01/30 1118) Resp:  [16] 16 (01/30 1118) BP: (195)/(102) 195/102 (01/30 1118) SpO2:  [100 %] 100 % (01/30 1118) Physical Exam Vitals reviewed.  Constitutional:      Appearance: Normal appearance.  HENT:     Head: Normocephalic and atraumatic.     Nose: Nose normal. No congestion.     Mouth/Throat:     Mouth: Mucous membranes are dry.  Eyes:     Extraocular Movements: Extraocular movements intact.     Pupils: Pupils are equal, round, and reactive to light.  Cardiovascular:     Rate and Rhythm: Normal rate and regular rhythm.  Pulmonary:     Effort: Pulmonary effort is normal. No respiratory distress.  Abdominal:     General: Abdomen is flat. There is no distension.  Musculoskeletal:        General: No swelling. Normal range of  motion.     Cervical back: Normal range of motion and neck supple.  Skin:    General: Skin is warm and dry.  Neurological:     General: No focal deficit present.     Mental Status: He is alert and oriented to person, place, and time.  Psychiatric:        Mood and Affect: Mood normal.        Behavior: Behavior normal.        Thought Content: Thought content normal.        Judgment: Judgment normal.    Laboratory Data:  Results for orders placed or performed during the hospital encounter of 05/06/21 (from the past 24 hour(s))  Glucose, capillary     Status: Abnormal   Collection Time: 05/06/21 11:05 AM  Result Value Ref Range   Glucose-Capillary 100 (H) 70 - 99 mg/dL   No results found for this or any previous visit (from the past 240 hour(s)). Creatinine: Recent Labs    05/02/21 0821  CREATININE 1.01   Baseline Creatinine: 1  Impression/Assessment:  75yo with prostate cancer  Plan:  I discussed the natural history of high risk prostate cancer with the patient and the various treatment options including active surveillance, RALP, IMRT, brachytherapy, cryotherapy, HIFU and ADT. After discussing the options the patient elects for IMRT with fiducial markers and SpaceOAR. Risks/benefits/alternatives discussed   Nicolette Bang 05/06/2021, 12:18 PM

## 2021-05-06 NOTE — Anesthesia Preprocedure Evaluation (Signed)
Anesthesia Evaluation  Patient identified by MRN, date of birth, ID band Patient awake    Reviewed: Allergy & Precautions, NPO status , Patient's Chart, lab work & pertinent test results  Airway Mallampati: III  TM Distance: >3 FB Neck ROM: Full    Dental  (+) Dental Advisory Given, Implants   Pulmonary shortness of breath and with exertion, sleep apnea , Current Smoker,    Pulmonary exam normal breath sounds clear to auscultation       Cardiovascular hypertension, Pt. on medications Normal cardiovascular exam Rhythm:Regular Rate:Normal     Neuro/Psych PSYCHIATRIC DISORDERS Anxiety Depression  Neuromuscular disease    GI/Hepatic negative GI ROS, Neg liver ROS,   Endo/Other  negative endocrine ROS  Renal/GU negative Renal ROS  negative genitourinary   Musculoskeletal negative musculoskeletal ROS (+)   Abdominal   Peds negative pediatric ROS (+)  Hematology  (+) Blood dyscrasia (non hodgkins lymphoma), anemia ,   Anesthesia Other Findings   Reproductive/Obstetrics negative OB ROS                            Anesthesia Physical Anesthesia Plan  ASA: 3  Anesthesia Plan: General   Post-op Pain Management: Dilaudid IV   Induction: Intravenous  PONV Risk Score and Plan: 3 and Ondansetron and Dexamethasone  Airway Management Planned: LMA  Additional Equipment:   Intra-op Plan:   Post-operative Plan: Extubation in OR  Informed Consent: I have reviewed the patients History and Physical, chart, labs and discussed the procedure including the risks, benefits and alternatives for the proposed anesthesia with the patient or authorized representative who has indicated his/her understanding and acceptance.     Dental advisory given  Plan Discussed with: CRNA and Surgeon  Anesthesia Plan Comments:         Anesthesia Quick Evaluation

## 2021-05-07 ENCOUNTER — Encounter (HOSPITAL_COMMUNITY): Payer: Self-pay | Admitting: Urology

## 2021-07-04 NOTE — Progress Notes (Signed)
Letter sent to appeals ?

## 2021-07-09 ENCOUNTER — Other Ambulatory Visit: Payer: Self-pay

## 2021-07-09 DIAGNOSIS — N401 Enlarged prostate with lower urinary tract symptoms: Secondary | ICD-10-CM

## 2021-07-09 MED ORDER — TADALAFIL 5 MG PO TABS: ORAL_TABLET | ORAL | 0 refills | Status: DC

## 2021-07-22 ENCOUNTER — Encounter: Payer: Self-pay | Admitting: Urology

## 2021-07-22 ENCOUNTER — Ambulatory Visit (INDEPENDENT_AMBULATORY_CARE_PROVIDER_SITE_OTHER): Payer: Federal, State, Local not specified - PPO | Admitting: Urology

## 2021-07-22 VITALS — BP 150/81 | HR 54

## 2021-07-22 DIAGNOSIS — R911 Solitary pulmonary nodule: Secondary | ICD-10-CM | POA: Diagnosis not present

## 2021-07-22 DIAGNOSIS — E041 Nontoxic single thyroid nodule: Secondary | ICD-10-CM | POA: Diagnosis not present

## 2021-07-22 DIAGNOSIS — C61 Malignant neoplasm of prostate: Secondary | ICD-10-CM

## 2021-07-22 DIAGNOSIS — N401 Enlarged prostate with lower urinary tract symptoms: Secondary | ICD-10-CM

## 2021-07-22 DIAGNOSIS — R3914 Feeling of incomplete bladder emptying: Secondary | ICD-10-CM

## 2021-07-22 NOTE — Progress Notes (Signed)
? ?07/22/2021 ?11:10 AM  ? ?William Joseph ?[birth year] ?[id number] ? ?Referring provider: Celene Squibb, MD ?[address] ?[address] ?[place],  [place] [address] ? ?No chief complaint on file. ? ? ?HPI: ?F/u -  ? ?1) prostate cancer -diagnosed with intermediate to high risk prostate cancer November 2022 and treated with ADT (04/26/2021 - 10/24/2021) and IMRT (April 2023) with Dr. Lynnette Caffey.   ? ?His Oct 2022 PSA was 10.1 (20.2). He is also on dutasteride (small, benign prostate on exam). His dad had PCa.His dad died 2021/05/11 and was on Lupron and was 74 yo.   ?  ?Biopsy: ?November 2022-intermediate to high risk ?PSA 10.1 (20.2)-dutasteride use ?T1c (hypoechoic areas were noted in the left prostate on ultrasound) ?Prostate 25 g ?Gleason 4+3=7, 3 cores, 60 to 80%, left ?Gleason 3+4=7, 2 cores, 20 to 40%, left ?5/12 ?  ?Staging: ?-December 2022 PET scan-left hemiprostate activity but no metastatic disease.  Pulmonary nodules, right neck activity adjacent to the thyroid gland. ?-December 2022 thyroid ultrasound-1.1 cm right mid hypoechoic prostate nodule noted to be a TR 5 risk category lesion. ?-December 2022 CT chest-benign appearing pulmonary nodules.  Repeat chest CT around June 2023 ? ?2) BPH - on tamsulosin and tadalafil daily. AUASS = 14 now 24. Symptoms of freq, urge, and nocturia. His PVR is 0 ml. Stop dutasteride Apr 2023.  ?  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 ? ?He is seen for the above and management of prostate cancer, pulmonary nodules and thyroid nodule. He has some fatigue, bladder and bowel urgency and frequency. He completed XRT 2 weeks ago. His symptoms are improving. He did get an erection. He has not had FNA of thyroid.  ? ?PMH: ?Past Medical History:  ?Diagnosis Date  ? Anxiety   ? Essential hypertension, benign   ? History of cardiac catheterization   ? Normal coronaries March 2011  ? History of non-Hodgkin's lymphoma 06/26/2009  ? Qualifier: Diagnosis of  By: Dwaine Gale, CNA,  [place]    ? Non Hodgkin's lymphoma ([place])   ? Nonerosive nonspecific gastritis   ? EGD - Dr. Laural Golden  ? Obsessive-compulsive disorder   ? Palpitations   ? Sleep apnea   ? Thyroid mass   ? ? ?Surgical History: ?Past Surgical History:  ?Procedure Laterality Date  ? COLONOSCOPY N/A 06/29/2015  ? Procedure: COLONOSCOPY;  Surgeon: Daneil Dolin, MD;  [facility];  Service: Endoscopy;  Laterality: N/A;  9:30 Am - moved to 9:15 - office to notify  ? ESOPHAGOGASTRODUODENOSCOPY ENDOSCOPY N/A   ? EXPLORATORY LAPAROTOMY    ? GLAUCOMA SURGERY Bilateral 11/2013  ? didnt work  ? GOLD SEED IMPLANT N/A 05/06/2021  ? Procedure: GOLD SEED IMPLANT;  Surgeon: Cleon Gustin, MD;  Location: [facility];  Service: Urology;  Laterality: N/A;  ? LYMPH NODE BIOPSY    ? POLYPECTOMY  06/29/2015  ? Procedure: POLYPECTOMY;  Surgeon: Daneil Dolin, MD;  [facility];  Service: Endoscopy;;  descending colon polyp removed via cold snare  ? Port-a-cath placement    ? SPACE OAR INSTILLATION N/A 05/06/2021  ? Procedure: SPACE OAR INSTILLATION;  Surgeon: Cleon Gustin, MD;  Location: [facility];  Service: Urology;  Laterality: N/A;  ? Tooth implant    ? ? ?Home Medications:  ?Allergies as of 07/22/2021   ? ?   Reactions  ? Tomato Itching  ? ?  ? ?  ?Medication List  ?  ? ?  ?  Accurate as of July 22, 2021 11:10 AM. If you have any questions, ask your nurse or doctor.  ?  ?  ? ?  ? ?BC FAST PAIN RELIEF PO ?Take 1 packet by mouth 2 (two) times daily as needed (pain). Reported on 04/26/2015 ?  ?CeraVe Crea ?Apply 1 application topically 3 (three) times daily as needed (foot irritation). ?  ?dutasteride 0.5 MG capsule ?Commonly known as: AVODART ?Take 1 capsule (0.5 mg total) by mouth daily. ?  ?fluticasone 50 MCG/ACT nasal spray ?Commonly known as: FLONASE ?Place 1 spray into both nostrils every evening. ?  ?Gold Bond Foot Crea ?Apply 1 application topically as needed (for pain.). Gold Bond Triple Action Relief Foot Cream ?  ?losartan 25  MG tablet ?Commonly known as: COZAAR ?TAKE 1 TABLET(25 MG) BY MOUTH DAILY ?What changed: See the new instructions. ?  ?mometasone 0.1 % cream ?Commonly known as: ELOCON ?Apply 1 application topically daily as needed (skin irritation.). ?  ?multivitamin with minerals Tabs tablet ?Take 1 tablet by mouth in the morning. ?  ?rosuvastatin 10 MG tablet ?Commonly known as: CRESTOR ?Take 10 mg by mouth at bedtime. ?  ?Simbrinza 1-0.2 % Susp ?Generic drug: Brinzolamide-Brimonidine ?Place 1 drop into both eyes 2 (two) times daily. ?  ?tadalafil 5 MG tablet ?Commonly known as: CIALIS ?TAKE 1 TABLET(5 MG) BY MOUTH DAILY ?  ?tamsulosin 0.4 MG Caps capsule ?Commonly known as: FLOMAX ?TAKE 1 CAPSULE BY MOUTH EVERY DAY ?What changed:  ?how to take this ?when to take this ?  ?traMADol 50 MG tablet ?Commonly known as: Ultram ?Take 1 tablet (50 mg total) by mouth every 6 (six) hours as needed. ?  ?VITAMIN B COMPLEX PO ?Take 1 capsule by mouth in the morning. ?  ? ?  ? ? ?Allergies:  ?Allergies  ?Allergen Reactions  ? Tomato Itching  ? ? ?Family History: ?Family History  ?Problem Relation Age of Onset  ? Diabetes Father   ? Coronary artery disease Mother   ? Hypertension Brother   ? Mental illness Maternal Uncle   ? Dementia Maternal Grandfather   ? Seizures Cousin   ? Drug abuse Other   ? Seizures Maternal Uncle   ? ADD / ADHD Neg Hx   ? Alcohol abuse Neg Hx   ? Anxiety disorder Neg Hx   ? Bipolar disorder Neg Hx   ? Depression Neg Hx   ? OCD Neg Hx   ? Paranoid behavior Neg Hx   ? Schizophrenia Neg Hx   ? Sexual abuse Neg Hx   ? Physical abuse Neg Hx   ? ? ?Social History:  reports that he has been smoking cigars and cigarettes. He started smoking about 56 years ago. He has a 1.00 pack-year smoking history. He has never used smokeless tobacco. He reports current alcohol use. He reports that he does not use drugs. ? ? ?Physical Exam: ?BP (!) 150/81   Pulse (!) 54   ?Constitutional:  Alert and oriented, No acute distress. ?HEENT: [place]  AT, moist mucus membranes.  Trachea midline, no masses - reconfirmed today.  ?Cardiovascular: No clubbing, cyanosis, or edema. ?Respiratory: Normal respiratory effort, no increased work of breathing. ?GI: Abdomen is soft, nontender, nondistended, no abdominal masses ?GU: No CVA tenderness ?Skin: No rashes, bruises or suspicious lesions. ?Neurologic: Grossly intact, no focal deficits, moving all 4 extremities. ?Psychiatric: Normal mood and affect. ? ?Laboratory Data: ?Lab Results  ?Component Value Date  ? WBC 5.1 05/02/2021  ? HGB 12.5 (L)  05/02/2021  ? HCT 40.6 05/02/2021  ? MCV 96.0 05/02/2021  ? PLT 163 05/02/2021  ? ? ?Lab Results  ?Component Value Date  ? CREATININE 1.01 05/02/2021  ? ? ?Lab Results  ?Component Value Date  ? PSA 5.4 05/18/2017  ? ? ?Lab Results  ?Component Value Date  ? TESTOSTERONE  03/24/2007  ?  430.51 ?(NOTE)      Tanner Stage       Male              Male         I              < 30 ng/dL        < 10 ng/dL         II             < 150 ng/dL       < 30 ng/dL         III            100-320 ng/dL     < 35 ng/dL         IV             200-970 ng/dL      ?15-40 ng/dL         V              350-890 ng/dL     10-70 ng/dL    ? ? ?Lab Results  ?Component Value Date  ? HGBA1C 6.2 (H) 05/02/2021  ? ? ?Urinalysis ?   ?Component Value Date/Time  ? APPEARANCEUR Clear 01/07/2021 1037  ? GLUCOSEU Negative 01/07/2021 1037  ? BILIRUBINUR Negative 01/07/2021 1037  ? PROTEINUR Negative 01/07/2021 1037  ? NITRITE Negative 01/07/2021 1037  ? LEUKOCYTESUR Negative 01/07/2021 1037  ? ? ?Lab Results  ?Component Value Date  ? LABMICR Comment 01/07/2021  ? ? ?Pertinent Imaging: ?Pet scan, thyroid ultrasound and chest CT reports reviewed and discussed with patient. ? ? ?Assessment & Plan:   ? ?1. Prostate cancer - PSA was sent today and check repeat in 3 mo --> discussed 6 mo vs 18 mo ADT and he will consider. ? ? Urinalysis, Routine w reflex microscopic ? ?2. BPH - continue tams , stop dutasteride  ? ?3. Pulm nodule  - check CT around Jul 2023  ? ?4. Thyroid nodule - again - refer to IR  ? ?No follow-ups on file. ? ?Festus Aloe, MD ? ?[facility]  ?[place]

## 2021-07-23 LAB — TESTOSTERONE: Testosterone: <3 ng/dL — ABNORMAL LOW (ref 264–916)

## 2021-07-23 LAB — PSA: Prostate Specific Ag, Serum: <0.1 ng/mL (ref 0.0–4.0)

## 2021-07-30 ENCOUNTER — Encounter: Payer: Self-pay | Admitting: Neurology

## 2021-07-30 ENCOUNTER — Ambulatory Visit: Payer: Medicare Other | Admitting: Neurology

## 2021-08-01 ENCOUNTER — Telehealth: Payer: Self-pay

## 2021-08-01 NOTE — Telephone Encounter (Signed)
Spoke with IR in regard to FNA Thyroid Nodule. They advised they just need an imaging order and they can schedule.  ? ? ?Thank you! ?

## 2021-08-06 ENCOUNTER — Other Ambulatory Visit (HOSPITAL_COMMUNITY): Payer: Self-pay | Admitting: Urology

## 2021-08-06 ENCOUNTER — Other Ambulatory Visit: Payer: Self-pay | Admitting: Urology

## 2021-08-06 DIAGNOSIS — E041 Nontoxic single thyroid nodule: Secondary | ICD-10-CM

## 2021-08-06 NOTE — Telephone Encounter (Signed)
William Joseph - what did IR say needs to be ordered? It's OK if they put in the order or you put in the order and send to me. Or let me know what it is and I can order. Thanks!  ?

## 2021-08-06 NOTE — Telephone Encounter (Signed)
Spoke with Olegario Shearer and the [facility] and she advised she put in an order and she just needed you to sign it. ? ? ?Thank you!  ?

## 2021-08-07 ENCOUNTER — Telehealth: Payer: Self-pay

## 2021-08-07 NOTE — Telephone Encounter (Signed)
MD sent message on 08/07/2021 office visit with Radiation oncology ?

## 2021-08-27 ENCOUNTER — Ambulatory Visit
Admission: RE | Admit: 2021-08-27 | Discharge: 2021-08-27 | Disposition: A | Discharge status: Home / Self Care | Payer: Federal, State, Local not specified - PPO | Source: Ambulatory Visit | Attending: Urology | Admitting: Urology

## 2021-08-27 ENCOUNTER — Other Ambulatory Visit (HOSPITAL_COMMUNITY)
Admission: RE | Admit: 2021-08-27 | Discharge: 2021-08-27 | Disposition: A | Discharge status: Home / Self Care | Payer: Federal, State, Local not specified - PPO | Source: Ambulatory Visit | Attending: Urology | Admitting: Urology

## 2021-08-27 DIAGNOSIS — E041 Nontoxic single thyroid nodule: Secondary | ICD-10-CM | POA: Insufficient documentation

## 2021-08-28 LAB — CYTOLOGY - NON PAP

## 2021-08-29 ENCOUNTER — Telehealth (HOSPITAL_COMMUNITY): Payer: Self-pay | Admitting: Radiology

## 2021-08-29 NOTE — Progress Notes (Signed)
Patient ID: William Joseph, male   DOB: [birth year], 76 y.o.   MRN: [id number]  Patient called with wife reporting swelling, bruising and pain to the site of the thyroid biopsy. Patient states that the pain is intermittent but is worse when swallowing. Patient denies any shob, or difficulty with airway. Patient instructed to take 400-600 mg of ibuprofen q 6-8 hours and continue to ice affected area  20 minutes on 20 minutes off.  Patient made aware that swelling may become slightly worse before it improved. Should patient have any issues difficulty breathing that would result in an immediate ED visit. Patient and wife verbalized understanding and  are in agreement with the plan of care

## 2021-09-04 ENCOUNTER — Telehealth: Payer: Self-pay | Admitting: Urology

## 2021-09-04 DIAGNOSIS — C73 Malignant neoplasm of thyroid gland: Secondary | ICD-10-CM

## 2021-09-04 NOTE — Telephone Encounter (Signed)
I called and spoke to patient and as best I could as a urologist discussed his thyroid biopsy results. I discussed referral to ENT or Gen surg and will make a referral to ENT. He's had some swelling and fever after the biopsy site and I told him to go to [facility] so they could take a look and see if he needs any immediate treatment.

## 2021-09-11 ENCOUNTER — Encounter (HOSPITAL_COMMUNITY): Payer: Self-pay

## 2021-09-12 ENCOUNTER — Other Ambulatory Visit: Payer: Self-pay | Admitting: Urology

## 2021-09-19 DIAGNOSIS — C73 Malignant neoplasm of thyroid gland: Secondary | ICD-10-CM | POA: Insufficient documentation

## 2021-09-23 ENCOUNTER — Ambulatory Visit (INDEPENDENT_AMBULATORY_CARE_PROVIDER_SITE_OTHER): Payer: Federal, State, Local not specified - PPO | Admitting: Orthopedic Surgery

## 2021-09-23 ENCOUNTER — Ambulatory Visit: Payer: Federal, State, Local not specified - PPO

## 2021-09-23 ENCOUNTER — Ambulatory Visit (INDEPENDENT_AMBULATORY_CARE_PROVIDER_SITE_OTHER): Payer: Federal, State, Local not specified - PPO

## 2021-09-23 VITALS — BP 158/89 | HR 68 | Ht 74.0 in | Wt 225.0 lb

## 2021-09-23 DIAGNOSIS — M79672 Pain in left foot: Secondary | ICD-10-CM

## 2021-09-23 DIAGNOSIS — M79671 Pain in right foot: Secondary | ICD-10-CM

## 2021-09-23 NOTE — Progress Notes (Signed)
Chief Complaint  Patient presents with   Foot Pain    Bilateral foot pain burning/ toes feel wet but they are not    76 year old with bilateral burning pain in his feet under his metatarsal heads with his toes having a feeling that they are wet but when he touches them he feels no moisture  He denies any trauma  Review of systems nothing remarkable related to musculoskeletal system or his feet.  His exam is normal his foot alignment looks good he has normal range and of the ankle as well as metatarsophalangeal joints with no pain  He appears to have normal sensation to soft touch I did not do a Semmes Weinstein filament test  His x-rays were negative  Possible peripheral neuropathy  Recommend  Needs to see PMD for treatment of neuropathy (already on gabapentin) poss change to lyrica

## 2021-09-23 NOTE — Patient Instructions (Addendum)
Dr Nevada Crane REFER TO IN NETWORK PPO NEUROLOGIST FOR FEET NEUROPATHY

## 2021-10-14 ENCOUNTER — Other Ambulatory Visit: Payer: Federal, State, Local not specified - PPO

## 2021-10-14 ENCOUNTER — Ambulatory Visit (HOSPITAL_COMMUNITY)
Admission: RE | Admit: 2021-10-14 | Discharge: 2021-10-14 | Disposition: A | Discharge status: Home / Self Care | Payer: Federal, State, Local not specified - PPO | Source: Ambulatory Visit | Attending: Urology | Admitting: Urology

## 2021-10-14 DIAGNOSIS — R911 Solitary pulmonary nodule: Secondary | ICD-10-CM | POA: Diagnosis present

## 2021-10-15 LAB — PSA: Prostate Specific Ag, Serum: <0.1 ng/mL (ref 0.0–4.0)

## 2021-10-15 LAB — TESTOSTERONE: Testosterone: <3 ng/dL — ABNORMAL LOW (ref 264–916)

## 2021-10-21 ENCOUNTER — Ambulatory Visit (INDEPENDENT_AMBULATORY_CARE_PROVIDER_SITE_OTHER): Payer: Federal, State, Local not specified - PPO | Admitting: Urology

## 2021-10-21 VITALS — BP 162/72 | HR 63 | Ht 74.0 in | Wt 223.0 lb

## 2021-10-21 DIAGNOSIS — R911 Solitary pulmonary nodule: Secondary | ICD-10-CM | POA: Diagnosis not present

## 2021-10-21 DIAGNOSIS — N5201 Erectile dysfunction due to arterial insufficiency: Secondary | ICD-10-CM

## 2021-10-21 DIAGNOSIS — R972 Elevated prostate specific antigen [PSA]: Secondary | ICD-10-CM

## 2021-10-21 LAB — URINALYSIS, ROUTINE W REFLEX MICROSCOPIC
Bilirubin, UA: NEGATIVE
Glucose, UA: NEGATIVE
Ketones, UA: NEGATIVE
Leukocytes,UA: NEGATIVE
Nitrite, UA: NEGATIVE
RBC, UA: NEGATIVE
Specific Gravity, UA: 1.02 (ref 1.005–1.030)
Urobilinogen, Ur: 1 mg/dL (ref 0.2–1.0)
pH, UA: 7.5 (ref 5.0–7.5)

## 2021-10-21 MED ORDER — MEGESTROL ACETATE 20 MG PO TABS: 20.0000 mg | ORAL_TABLET | Freq: Two times a day (BID) | ORAL | 3 refills | Status: DC | PRN

## 2021-10-21 MED ORDER — SILDENAFIL CITRATE 20 MG PO TABS: 20.0000 mg | ORAL_TABLET | Freq: Every day | ORAL | 3 refills | Status: DC | PRN

## 2021-10-21 NOTE — Progress Notes (Signed)
10/21/2021 10:45 AM   William Joseph 01-Sep-1945 [id number]  Referring provider: Celene Squibb, MD [address] William Joseph,  [name] [address]  No chief complaint on file.   HPI:  F/u -    1) prostate cancer -diagnosed with intermediate to high risk prostate cancer November 2022 and treated with ADT (04/26/2021 - 10/24/2021) and IMRT (April 2023) with Dr. Lynnette Joseph.     His Oct 2022 PSA was 10.1 (20.2). He is also on dutasteride (small, benign prostate on exam). His dad had PCa.His dad died 05-04-2021 and was on Lupron and was 74 yo.     Biopsy: November 2022-intermediate to high risk PSA 10.1 (20.2)-dutasteride use T1c (hypoechoic areas were noted in the left prostate on ultrasound) Prostate 25 g Gleason 4+3=7, 3 cores, 60 to 80%, left Gleason 3+4=7, 2 cores, 20 to 40%, left 5/12   Staging: -December 2022 PET scan-left hemiprostate activity but no metastatic disease.  Pulmonary nodules, right neck activity adjacent to the thyroid gland. -December 2022 thyroid ultrasound-1.1 cm right mid hypoechoic prostate nodule noted to be a TR 5 risk category lesion. -December 2022 CT chest-benign appearing pulmonary nodules.  Repeat chest CT around June 2023 -Jul 2023 - CT chest - Stable 1.7 cm ground-glass nodule in lingula. Continued follow-up by chest CT is recommended in 2 years (in 2025 and until 5 years of stability - 2027)     2) BPH - on tamsulosin and tadalafil daily. AUASS = 14 now 24. Symptoms of freq, urge, and nocturia. His PVR is 0 ml. Stop dutasteride Apr 2023.   3) ED - he is on daily tadlafil with mixed results. He has trouble getting and maintaining erection.    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   He is seen for the above and management of prostate cancer, pulmonary nodules and thyroid nodule. His PSA is udt and T < 3. He saw Dr. Redmond Joseph and needs thyroid surgery. F/u chest CT stable - due for another in 2025. He has bothersome hot flashes.    He is using an OTC "gadget" for PN and asked if he could stop gabapentin. I told him to discuss with Dr. [place] Joseph.   PMH: Past Medical History:  Diagnosis Date   Anxiety    Essential hypertension, benign    History of cardiac catheterization    Normal coronaries March 2011   History of non-Hodgkin's lymphoma 06/26/2009   Qualifier: Diagnosis of  By: William Gale, CNA, William Joseph     Non Hodgkin's lymphoma (William Joseph)    Nonerosive nonspecific gastritis    EGD - Dr. Laural Joseph   Obsessive-compulsive disorder    Palpitations    Sleep apnea    Thyroid mass     Surgical History: Past Surgical History:  Procedure Laterality Date   COLONOSCOPY N/A 06/29/2015   Procedure: COLONOSCOPY;  Surgeon: William Dolin, MD;  [facility];  Service: Endoscopy;  Laterality: N/A;  9:30 Am - moved to 9:15 - office to notify   ESOPHAGOGASTRODUODENOSCOPY ENDOSCOPY N/A    EXPLORATORY LAPAROTOMY     GLAUCOMA SURGERY Bilateral 11/2013   didnt work   GOLD SEED IMPLANT N/A 05/06/2021   Procedure: GOLD SEED IMPLANT;  Surgeon: William Gustin, MD;  Location: [facility];  Service: Urology;  Laterality: N/A;   LYMPH NODE BIOPSY     POLYPECTOMY  06/29/2015   Procedure: POLYPECTOMY;  Surgeon: William Dolin, MD;  [facility];  Service: Endoscopy;;  descending  colon polyp removed via cold snare   Port-a-cath placement     SPACE OAR INSTILLATION N/A 05/06/2021   Procedure: SPACE OAR INSTILLATION;  Surgeon: William Gustin, MD;  Location: [facility];  Service: Urology;  Laterality: N/A;   Tooth implant      Home Medications:  Allergies as of 10/21/2021       Reactions   Tomato Itching        Medication List        Accurate as of October 21, 2021 10:45 AM. If you have any questions, ask your nurse or doctor.          gabapentin 100 MG capsule Commonly known as: NEURONTIN Take 100 mg by mouth daily.   losartan 25 MG tablet Commonly known as: COZAAR TAKE 1 TABLET(25 MG) BY MOUTH DAILY What  changed: See the new instructions.   losartan 25 MG tablet Commonly known as: COZAAR Take 1 tablet by mouth daily. What changed: Another medication with the same name was changed. Make sure you understand how and when to take each.   multivitamin with minerals Tabs tablet Take 1 tablet by mouth in the morning.   rosuvastatin 10 MG tablet Commonly known as: CRESTOR Take 10 mg by mouth at bedtime.   tadalafil 5 MG tablet Commonly known as: CIALIS TAKE 1 TABLET(5 MG) BY MOUTH DAILY   tamsulosin 0.4 MG Caps capsule Commonly known as: FLOMAX TAKE 1 CAPSULE BY MOUTH EVERY DAY What changed:  how to take this when to take this   traMADol 50 MG tablet Commonly known as: Ultram Take 1 tablet (50 mg total) by mouth every 6 (six) hours as needed.   VITAMIN B COMPLEX PO Take 1 capsule by mouth in the morning.        Allergies:  Allergies  Allergen Reactions   Tomato Itching    Family History: Family History  Problem Relation Age of Onset   Diabetes Father    Coronary artery disease Mother    Hypertension Brother    Mental illness Maternal Uncle    Dementia Maternal Grandfather    Seizures Cousin    Drug abuse Other    Seizures Maternal Uncle    ADD / ADHD Neg Hx    Alcohol abuse Neg Hx    Anxiety disorder Neg Hx    Bipolar disorder Neg Hx    Depression Neg Hx    OCD Neg Hx    Paranoid behavior Neg Hx    Schizophrenia Neg Hx    Sexual abuse Neg Hx    Physical abuse Neg Hx     Social History:  reports that he has been smoking cigars and cigarettes. He started smoking about 57 years ago. He has a 1.00 pack-year smoking history. He has never used smokeless tobacco. He reports current alcohol use. He reports that he does not use drugs.   Physical Exam: BP (!) 162/72   Pulse 63   Ht '6\' 2"'$  (1.88 m)   Wt 223 lb (101.2 kg)   BMI 28.63 kg/m   Constitutional:  Alert and oriented, No acute distress. HEENT: [place] AT, moist mucus membranes.  Trachea midline, no  masses. Cardiovascular: No clubbing, cyanosis, or edema. Respiratory: Normal respiratory effort, no increased work of breathing. GI: Abdomen is soft, nontender, nondistended, no abdominal masses GU: No CVA tenderness Skin: No rashes, bruises or suspicious lesions. Neurologic: Grossly intact, no focal deficits, moving all 4 extremities. Psychiatric: Normal mood and affect.  Laboratory Data: Lab  Results  Component Value Date   WBC 5.1 05/02/2021   HGB 12.5 (L) 05/02/2021   HCT 40.6 05/02/2021   MCV 96.0 05/02/2021   PLT 163 05/02/2021    Lab Results  Component Value Date   CREATININE 1.01 05/02/2021    Lab Results  Component Value Date   PSA 5.4 05/18/2017    Lab Results  Component Value Date   TESTOSTERONE <3 (L) 10/14/2021    Lab Results  Component Value Date   HGBA1C 6.2 (H) 05/02/2021    Urinalysis    Component Value Date/Time   APPEARANCEUR Clear 01/07/2021 1037   GLUCOSEU Negative 01/07/2021 1037   BILIRUBINUR Negative 01/07/2021 1037   PROTEINUR Negative 01/07/2021 1037   NITRITE Negative 01/07/2021 1037   LEUKOCYTESUR Negative 01/07/2021 1037    Lab Results  Component Value Date   LABMICR Comment 01/07/2021    Pertinent Imaging: Chest CT  Dr William Joseph notes  T and PSA    Assessment & Plan:    1. Prostate cancer - PSA looks great. Disc megestrol for the hot flashes. Check T and PSA in 3 mo  - Urinalysis, Routine w reflex microscopic  2. Pulm nodule- chest ct in 2 years or 2025.   3. ED - trial of low dose sildenafil prior sex. He tolerates daily tadalafil.   No follow-ups on file.  Festus Aloe, MD  [facility]  [address] [place], [place] [address] [phone]

## 2021-11-06 ENCOUNTER — Other Ambulatory Visit: Payer: Self-pay | Admitting: Otolaryngology

## 2021-11-11 ENCOUNTER — Ambulatory Visit: Payer: Federal, State, Local not specified - PPO | Admitting: Urology

## 2021-12-02 ENCOUNTER — Encounter (HOSPITAL_COMMUNITY): Payer: Self-pay | Admitting: Otolaryngology

## 2021-12-02 NOTE — Anesthesia Preprocedure Evaluation (Signed)
Anesthesia Evaluation  Patient identified by MRN, date of birth, ID band Patient awake    Reviewed: Allergy & Precautions, NPO status , Patient's Chart, lab work & pertinent test results  Airway Mallampati: II  TM Distance: >3 FB Neck ROM: Full    Dental  (+) Dental Advisory Given, Teeth Intact   Pulmonary shortness of breath, sleep apnea , Current SmokerPatient did not abstain from smoking.,    Pulmonary exam normal breath sounds clear to auscultation       Cardiovascular hypertension, Pt. on medications Normal cardiovascular exam Rhythm:Regular Rate:Normal  Echo 2013 - Left ventricle: The cavity size was normal. Wall thickness was increased in a pattern of mild LVH. Systolic function was normal. The estimated ejection fraction was in the range of 55% to 60%. Wall motion was normal; there were no regional wall motion abnormalities.  - Aortic valve: Valve area: 3.14cm^2(VTI). Valve area:  3.35cm^2 (Vmax).  - Atrial septum: No defect or patent foramen ovale was  identified.      Neuro/Psych  Headaches, PSYCHIATRIC DISORDERS Anxiety Depression  Neuromuscular disease    GI/Hepatic negative GI ROS, Neg liver ROS,   Endo/Other  negative endocrine ROS  Renal/GU negative Renal ROS     Musculoskeletal negative musculoskeletal ROS (+)   Abdominal   Peds  Hematology  (+) Blood dyscrasia, anemia ,   Anesthesia Other Findings   Reproductive/Obstetrics                           Anesthesia Physical Anesthesia Plan  ASA: 3  Anesthesia Plan: General   Post-op Pain Management: Tylenol PO (pre-op)*   Induction: Intravenous  PONV Risk Score and Plan: 2 and Ondansetron, Dexamethasone and Treatment may vary due to age or medical condition  Airway Management Planned: Oral ETT  Additional Equipment:   Intra-op Plan:   Post-operative Plan: Extubation in OR  Informed Consent: I have  reviewed the patients History and Physical, chart, labs and discussed the procedure including the risks, benefits and alternatives for the proposed anesthesia with the patient or authorized representative who has indicated his/her understanding and acceptance.     Dental advisory given  Plan Discussed with: CRNA  Anesthesia Plan Comments: (PAT note written 12/02/2021 by Myra Gianotti, PA-C. )      Anesthesia Quick Evaluation

## 2021-12-02 NOTE — Progress Notes (Signed)
COVID Vaccine Completed: yes x3  Date of COVID positive in last 90 days: no  PCP - Allyn Kenner, MD Cardiologist - Jenkins Rouge, MD  Chest x-ray - 10/15/21 Epic EKG - DOS Stress Test - 11/28/19 Epic ECHO - 2013 Cardiac Cath - 2011 Pacemaker/ICD device last checked: n/a Spinal Cord Stimulator:n/a  Bowel Prep - no  Sleep Study - yes, positive CPAP - no per pt, hasn't ha a problem in years  Fasting Blood Sugar - preDM, no meds or sugar checks at home Checks Blood Sugar _____ times a day  Blood Thinner Instructions: n/a Aspirin Instructions: Last Dose:  Activity level: Can go up a flight of stairs and perform activities of daily living without stopping and without symptoms of chest pain or shortness of breath.   Anesthesia review: prostate cancer, non-Hodgkin lymphoma, OSA , difficulty swallowing, swollen on right neck  Patient denies shortness of breath, fever, cough and chest pain at PAT appointment  Patient verbalized understanding of instructions that were given to them at the PAT appointment. Patient was also instructed that they will need to review over the PAT instructions again at home before surgery.

## 2021-12-02 NOTE — Progress Notes (Signed)
Anesthesia Chart Review:  Case: [id number] Date/Time: 12/04/21 0737   Procedure: THYROID LOBECTOMY (Right)   Anesthesia type: General   Pre-op diagnosis: Thyroid cancer   Location: [facility] ROOM 08 / [facility]   Surgeons: Melida Quitter, MD       DISCUSSION: Patient is a 76 year old male scheduled for the above procedure. A hypermetabolic right thyroid nodule was identified during testing for prostate surgery. 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   History includes smoking, OSA (does not use CPAP), non-Hodgkin's lymphoma (2005, s/p chemotherapy), pre-diabetes, palpitations (2013), tooth implant, Port-a-cath (05/05/03-10/12/03), prostate cancer (s/p ADT 04/26/21-10/24/21, IMRT 07/2021), OCD.  Last cardiology visit was with Dr. Johnsie Cancel in 11/2019 for atypical chest pain and had a low risk stress test. He did not schedule follow-up. He reported being able to go up a flight of stairs and do ADLs without chest pain or dyspnea. Previous normal coronaries on remote LHC in 2011.  S/p gold seed implants under anesthesia for prostate cancer 05/06/21, s/p intensity modulated radiation therapy earlier this year. Now found to have papillary thyroid cancer and needs surgery. Low risk stress test about 2 years ago. His last EKG is > 57 year old, so would anticipate updated tracing on the day of surgery. He denied chest pain and SOB per PAT RN phone interview. His A1c was 6.2% on 05/02/21. He is a same day work-up, so anesthesia team to evaluate on the day of surgery.    VS:  BP Readings from Last 3 Encounters:  10/21/21 (!) 162/72  09/23/21 (!) 158/89  07/22/21 (!) 150/81   Pulse Readings from Last 3 Encounters:  10/21/21 63  09/23/21 68  07/22/21 (!) 54     PROVIDERS: Celene Squibb, MD is PCP  - Jenkins Rouge, MD is cardiologist. Last visit 11/15/19 for atypical chest pain. Nuclear stress test was ordered and felt overall low risk. No additional cardiac testing was recommended. Six month follow-up had  been planned. Previously saw Rozann Lesches in 2013 for palpitations and Kate Sable, MD in 2017 for exertional dyspnea and had a low risk stess test. .   Festus Aloe, MD is urologist - Lieutenant Diego, MD is RAD-ONC   LABS: Labs on arrival as indicated. Most recent results in [facility] include: Lab Results  Component Value Date   WBC 5.1 05/02/2021   HGB 12.5 (L) 05/02/2021   HCT 40.6 05/02/2021   PLT 163 05/02/2021   GLUCOSE 100 (H) 05/02/2021   ALT 12 10/16/2020   AST 14 10/16/2020   NA 139 05/02/2021   K 4.1 05/02/2021   CL 108 05/02/2021   CREATININE 1.01 05/02/2021   BUN 13 05/02/2021   CO2 22 05/02/2021   HGBA1C 6.2 (H) 05/02/2021     Mild obstruction on 04/10/15 PFTs.   IMAGES: CT Chest 10/14/21: IMPRESSION: - Interval resolution of sub-cm right lung nodules, consistent with resolving inflammatory etiology. - Stable 1.7 cm ground-glass nodule in lingula. Continued follow-up by chest CT is recommended in 2 years (until 5 years of stability has been established). This recommendation follows the consensus statement: Guidelines for Management of Incidental Pulmonary Nodules Detected on CT Images: From the Fleischner Society 2017; Radiology 2017; 284:228-243. - Stable mild wall thickening of mid and distal thoracic esophagus, consistent with esophagitis. - Aortic Atherosclerosis (ICD10-I70.0).  US Thyroid 04/05/21: IMPRESSION: 1.1 cm right mid thyroid nodule meets TI-RADS criteria for fine-needle aspiration. This nodule also demonstrates increased metabolic activity by PET scan. - 08/27/21 right FNA: papillary  cancer  PET Scan 03/13/21: IMPRESSION: - Signs of focal activity in the LEFT hemi prostate compatible with known prostate cancer. No signs of metastatic disease to the neck, chest, abdomen or pelvis. - Focal bulging along the LEFT prostate gland, nonspecific on limited PET imaging and given recent biopsy. Given the extensive nature of activity  along the LEFT gland and this contour abnormality could consider MR imaging for staging as warranted. - Activity about the RIGHT low neck adjacent to the thyroid, this would be an unlikely site of isolated metastasis and there is no visible adenopathy in this location. Potential thyroid nodule is noted in this area. Recommend thyroid ultrasound and ultrasound of the RIGHT neck for further evaluation. - Pulmonary nodules in the RIGHT upper lobe largest is mildly spiculated with numerous small nodules seen throughout the RIGHT upper lobe. Largest measuring approximately 6 mm mean diameter. These may be infectious or inflammatory given multiplicity and do not show signs of radiotracer accumulation. However, given the spiculated nature of the dominant nodule these remain nonspecific, dominant nodule with spiculated morphology does not allow for exclusion of developing bronchogenic neoplasm. Short interval follow-up is suggested at 3 months. - Aortic Atherosclerosis (ICD10-I70.0).     EKG: EKG 06/28/20: Sinus  Bradycardia  Voltage criteria for LVH  (R(V5) exceeds 2.60 mV)  -Voltage criteria w/o ST/T abnormality may be normal.   -Old anteroseptal infarct.    CV: Nuclear stress test 11/28/19:  There was no ST segment deviation noted during stress. The study is normal. Inferior defect likely secondary to subdiaphragmatic attenuation. Cannot completely exclude inferior infarct with very minimal peri-infarct ischemia. Either finding would support low risk. This is a low risk study. The left ventricular ejection fraction is low normal (50%). - Dr. Johnsie Cancel reviewed and did not recommend cardiac cath at that time.   Echo 01/01/12:  Study Conclusions  - Left ventricle: The cavity size was normal. Wall thickness    was increased in a pattern of mild LVH. Systolic function    was normal. The estimated ejection fraction was in the    range of 55% to 60%. Wall motion was normal; there were no     regional wall motion abnormalities.  - Aortic valve: Valve area: 3.14cm^2(VTI). Valve area:    3.35cm^2 (Vmax).  - Atrial septum: No defect or patent foramen ovale was    identified.    48 Hour Holter Monitor 12/17/11: SR (41-142 bpm). Rare PVC's, PAC's. Very short bursts of occasional PSVT and possibly AF. Also aberrant brief SVT. No sustained events. No significant pauses.   Cardiac cath 06/15/09: EF 55%, no wall motion abnormalities in the RAO projection. Right dominant coronary system. No angiographic CAD.   Past Medical History:  Diagnosis Date   Anxiety    Essential hypertension, benign    Headache    History of cardiac catheterization    Normal coronaries March 2011   History of non-Hodgkin's lymphoma 06/26/2009   Qualifier: Diagnosis of  By: Dwaine Gale, CNA, Sandy     Non Hodgkin's lymphoma ([place])    Nonerosive nonspecific gastritis    EGD - Dr. Laural Golden   Obsessive-compulsive disorder    Palpitations    Pre-diabetes    Sleep apnea    Thyroid mass     Past Surgical History:  Procedure Laterality Date   COLONOSCOPY N/A 06/29/2015   Procedure: COLONOSCOPY;  Surgeon: Daneil Dolin, MD;  [facility];  Service: Endoscopy;  Laterality: N/A;  9:30 Am - moved to  9:15 - office to notify   ESOPHAGOGASTRODUODENOSCOPY ENDOSCOPY N/A    EXPLORATORY LAPAROTOMY     GLAUCOMA SURGERY Bilateral 11/2013   didnt work   GOLD SEED IMPLANT N/A 05/06/2021   Procedure: GOLD SEED IMPLANT;  Surgeon: Cleon Gustin, MD;  Location: [facility];  Service: Urology;  Laterality: N/A;   LYMPH NODE BIOPSY     POLYPECTOMY  06/29/2015   Procedure: POLYPECTOMY;  Surgeon: Daneil Dolin, MD;  [facility];  Service: Endoscopy;;  descending colon polyp removed via cold snare   Port-a-cath placement     SPACE OAR INSTILLATION N/A 05/06/2021   Procedure: SPACE OAR INSTILLATION;  Surgeon: Cleon Gustin, MD;  Location: [facility];  Service: Urology;  Laterality: N/A;   Tooth implant       MEDICATIONS: No current facility-administered medications for this encounter.    Aspirin-Salicylamide-Caffeine (BC HEADACHE POWDER PO)   B Complex Vitamins (VITAMIN B COMPLEX PO)   fluticasone (FLONASE) 50 MCG/ACT nasal spray   losartan (COZAAR) 25 MG tablet   megestrol (MEGACE) 20 MG tablet   Multiple Vitamin (MULTIVITAMIN WITH MINERALS) TABS tablet   rosuvastatin (CRESTOR) 10 MG tablet   sildenafil (REVATIO) 20 MG tablet   SIMBRINZA 1-0.2 % SUSP   tadalafil (CIALIS) 5 MG tablet   tamsulosin (FLOMAX) 0.4 MG CAPS capsule    Myra Gianotti, PA-C Surgical Short Stay/Anesthesiology [facility] Phone [phone] [facility] Phone [phone] 12/02/2021 3:01 PM

## 2021-12-04 ENCOUNTER — Encounter (HOSPITAL_COMMUNITY): Payer: Self-pay | Admitting: Otolaryngology

## 2021-12-04 ENCOUNTER — Other Ambulatory Visit: Payer: Self-pay

## 2021-12-04 ENCOUNTER — Ambulatory Visit (HOSPITAL_COMMUNITY): Payer: Federal, State, Local not specified - PPO | Admitting: Vascular Surgery

## 2021-12-04 ENCOUNTER — Observation Stay (HOSPITAL_COMMUNITY)
Admission: RE | Admit: 2021-12-04 | Discharge: 2021-12-05 | Disposition: A | Discharge status: Home / Self Care | Payer: Federal, State, Local not specified - PPO | Attending: Otolaryngology | Admitting: Otolaryngology

## 2021-12-04 ENCOUNTER — Encounter (HOSPITAL_COMMUNITY)
Admission: RE | Disposition: A | Discharge status: Home / Self Care | Payer: Self-pay | Source: Home / Self Care | Attending: Otolaryngology

## 2021-12-04 DIAGNOSIS — F17290 Nicotine dependence, other tobacco product, uncomplicated: Secondary | ICD-10-CM | POA: Diagnosis not present

## 2021-12-04 DIAGNOSIS — C73 Malignant neoplasm of thyroid gland: Principal | ICD-10-CM | POA: Insufficient documentation

## 2021-12-04 DIAGNOSIS — I10 Essential (primary) hypertension: Secondary | ICD-10-CM | POA: Diagnosis not present

## 2021-12-04 DIAGNOSIS — Z8572 Personal history of non-Hodgkin lymphomas: Secondary | ICD-10-CM | POA: Insufficient documentation

## 2021-12-04 DIAGNOSIS — Z79899 Other long term (current) drug therapy: Secondary | ICD-10-CM | POA: Insufficient documentation

## 2021-12-04 DIAGNOSIS — F17210 Nicotine dependence, cigarettes, uncomplicated: Secondary | ICD-10-CM | POA: Insufficient documentation

## 2021-12-04 HISTORY — DX: Headache, unspecified: R51.9

## 2021-12-04 HISTORY — PX: THYROID LOBECTOMY: SHX420

## 2021-12-04 HISTORY — DX: Prediabetes: R73.03

## 2021-12-04 LAB — BASIC METABOLIC PANEL
Anion gap: 12 (ref 5–15)
BUN: 19 mg/dL (ref 8–23)
CO2: 23 mmol/L (ref 22–32)
Calcium: 10 mg/dL (ref 8.9–10.3)
Chloride: 107 mmol/L (ref 98–111)
Creatinine, Ser: 1.06 mg/dL (ref 0.61–1.24)
GFR, Estimated: >60 mL/min (ref >60)
Glucose, Bld: 107 mg/dL — ABNORMAL HIGH (ref 70–99)
Potassium: 3.8 mmol/L (ref 3.5–5.1)
Sodium: 142 mmol/L (ref 135–145)

## 2021-12-04 LAB — CBC
HCT: 36.9 % — ABNORMAL LOW (ref 39.0–52.0)
Hemoglobin: 11.9 g/dL — ABNORMAL LOW (ref 13.0–17.0)
MCH: 30.3 pg (ref 26.0–34.0)
MCHC: 32.2 g/dL (ref 30.0–36.0)
MCV: 93.9 fL (ref 80.0–100.0)
Platelets: 143 10*3/uL — ABNORMAL LOW (ref 150–400)
RBC: 3.93 MIL/uL — ABNORMAL LOW (ref 4.22–5.81)
RDW: 13.6 % (ref 11.5–15.5)
WBC: 4.3 10*3/uL (ref 4.0–10.5)
nRBC: 0 % (ref 0.0–0.2)

## 2021-12-04 SURGERY — LOBECTOMY, THYROID
Anesthesia: General | Site: Neck | Laterality: Right

## 2021-12-04 SURGERY — LOBECTOMY, THYROID
Anesthesia: General | Laterality: Right

## 2021-12-04 MED ORDER — MORPHINE SULFATE (PF) 2 MG/ML IV SOLN: 2.0000 mg | INTRAVENOUS | Status: DC | PRN

## 2021-12-04 MED ORDER — PHENYLEPHRINE 80 MCG/ML (10ML) SYRINGE FOR IV PUSH (FOR BLOOD PRESSURE SUPPORT)
PREFILLED_SYRINGE | INTRAVENOUS | Status: DC | PRN
  Administered 2021-12-04 (×2): 80 ug via INTRAVENOUS

## 2021-12-04 MED ORDER — HYDROCODONE-ACETAMINOPHEN 5-325 MG PO TABS
1.0000 | ORAL_TABLET | ORAL | Status: DC | PRN
  Administered 2021-12-04 – 2021-12-05 (×4): 1 via ORAL
  Filled 2021-12-04 (×3): qty 1

## 2021-12-04 MED ORDER — KCL IN DEXTROSE-NACL 20-5-0.45 MEQ/L-%-% IV SOLN
INTRAVENOUS | Status: DC
  Filled 2021-12-04: qty 1000

## 2021-12-04 MED ORDER — ONDANSETRON HCL 4 MG PO TABS: 4.0000 mg | ORAL_TABLET | ORAL | Status: DC | PRN

## 2021-12-04 MED ORDER — DEXAMETHASONE SODIUM PHOSPHATE 10 MG/ML IJ SOLN
INTRAMUSCULAR | Status: DC | PRN
  Administered 2021-12-04: 10 mg via INTRAVENOUS

## 2021-12-04 MED ORDER — BRINZOLAMIDE 1 % OP SUSP
1.0000 [drp] | Freq: Two times a day (BID) | OPHTHALMIC | Status: DC
  Administered 2021-12-04 – 2021-12-05 (×2): 1 [drp] via OPHTHALMIC
  Filled 2021-12-04: qty 10

## 2021-12-04 MED ORDER — SUGAMMADEX SODIUM 200 MG/2ML IV SOLN
INTRAVENOUS | Status: DC | PRN
  Administered 2021-12-04: 200 mg via INTRAVENOUS

## 2021-12-04 MED ORDER — TAMSULOSIN HCL 0.4 MG PO CAPS
0.4000 mg | ORAL_CAPSULE | Freq: Every day | ORAL | Status: DC
  Administered 2021-12-04 – 2021-12-05 (×2): 0.4 mg via ORAL
  Filled 2021-12-04 (×2): qty 1

## 2021-12-04 MED ORDER — LIDOCAINE 2% (20 MG/ML) 5 ML SYRINGE
INTRAMUSCULAR | Status: DC | PRN
  Administered 2021-12-04: 100 mg via INTRAVENOUS
  Administered 2021-12-04: 20 mg via INTRAVENOUS

## 2021-12-04 MED ORDER — LIDOCAINE-EPINEPHRINE 1 %-1:100000 IJ SOLN
INTRAMUSCULAR | Status: DC | PRN
  Administered 2021-12-04: 5.5 mL

## 2021-12-04 MED ORDER — FENTANYL CITRATE (PF) 100 MCG/2ML IJ SOLN: 25.0000 ug | INTRAMUSCULAR | Status: DC | PRN

## 2021-12-04 MED ORDER — ORAL CARE MOUTH RINSE: 15.0000 mL | Freq: Once | OROMUCOSAL | Status: AC

## 2021-12-04 MED ORDER — ONDANSETRON HCL 4 MG/2ML IJ SOLN
INTRAMUSCULAR | Status: DC | PRN
  Administered 2021-12-04: 4 mg via INTRAVENOUS

## 2021-12-04 MED ORDER — LIDOCAINE 2% (20 MG/ML) 5 ML SYRINGE
INTRAMUSCULAR | Status: AC
  Filled 2021-12-04: qty 5

## 2021-12-04 MED ORDER — CEFAZOLIN SODIUM-DEXTROSE 2-4 GM/100ML-% IV SOLN
2.0000 g | INTRAVENOUS | Status: AC
  Administered 2021-12-04: 2 g via INTRAVENOUS
  Filled 2021-12-04: qty 100

## 2021-12-04 MED ORDER — PHENYLEPHRINE HCL-NACL 20-0.9 MG/250ML-% IV SOLN
INTRAVENOUS | Status: DC | PRN
  Administered 2021-12-04: 20 ug/min via INTRAVENOUS

## 2021-12-04 MED ORDER — PROPOFOL 10 MG/ML IV BOLUS
INTRAVENOUS | Status: AC
  Filled 2021-12-04: qty 20

## 2021-12-04 MED ORDER — ACETAMINOPHEN 500 MG PO TABS
1000.0000 mg | ORAL_TABLET | Freq: Once | ORAL | Status: AC
  Administered 2021-12-04: 1000 mg via ORAL
  Filled 2021-12-04: qty 2

## 2021-12-04 MED ORDER — MIDAZOLAM HCL 2 MG/2ML IJ SOLN
INTRAMUSCULAR | Status: AC
  Filled 2021-12-04: qty 2

## 2021-12-04 MED ORDER — FENTANYL CITRATE (PF) 250 MCG/5ML IJ SOLN
INTRAMUSCULAR | Status: DC | PRN
  Administered 2021-12-04: 100 ug via INTRAVENOUS
  Administered 2021-12-04: 50 ug via INTRAVENOUS

## 2021-12-04 MED ORDER — LIDOCAINE-EPINEPHRINE 1 %-1:100000 IJ SOLN
INTRAMUSCULAR | Status: AC
  Filled 2021-12-04: qty 1

## 2021-12-04 MED ORDER — LACTATED RINGERS IV SOLN: INTRAVENOUS | Status: DC

## 2021-12-04 MED ORDER — FENTANYL CITRATE (PF) 250 MCG/5ML IJ SOLN
INTRAMUSCULAR | Status: AC
  Filled 2021-12-04: qty 5

## 2021-12-04 MED ORDER — ONDANSETRON HCL 4 MG/2ML IJ SOLN: 4.0000 mg | INTRAMUSCULAR | Status: DC | PRN

## 2021-12-04 MED ORDER — BRIMONIDINE TARTRATE 0.2 % OP SOLN
1.0000 [drp] | Freq: Two times a day (BID) | OPHTHALMIC | Status: DC
  Administered 2021-12-04 – 2021-12-05 (×2): 1 [drp] via OPHTHALMIC
  Filled 2021-12-04 (×2): qty 5

## 2021-12-04 MED ORDER — MEGESTROL ACETATE 20 MG PO TABS: 20.0000 mg | ORAL_TABLET | Freq: Two times a day (BID) | ORAL | Status: DC | PRN

## 2021-12-04 MED ORDER — ROSUVASTATIN CALCIUM 5 MG PO TABS
10.0000 mg | ORAL_TABLET | Freq: Every day | ORAL | Status: DC
  Administered 2021-12-04: 10 mg via ORAL
  Filled 2021-12-04: qty 2

## 2021-12-04 MED ORDER — ROCURONIUM BROMIDE 10 MG/ML (PF) SYRINGE
PREFILLED_SYRINGE | INTRAVENOUS | Status: AC
  Filled 2021-12-04: qty 10

## 2021-12-04 MED ORDER — ROCURONIUM BROMIDE 10 MG/ML (PF) SYRINGE
PREFILLED_SYRINGE | INTRAVENOUS | Status: DC | PRN
  Administered 2021-12-04: 20 mg via INTRAVENOUS
  Administered 2021-12-04: 60 mg via INTRAVENOUS

## 2021-12-04 MED ORDER — MEPERIDINE HCL 25 MG/ML IJ SOLN: 6.2500 mg | INTRAMUSCULAR | Status: DC | PRN

## 2021-12-04 MED ORDER — METOPROLOL TARTRATE 5 MG/5ML IV SOLN
INTRAVENOUS | Status: AC
  Filled 2021-12-04: qty 5

## 2021-12-04 MED ORDER — LOSARTAN POTASSIUM 25 MG PO TABS
25.0000 mg | ORAL_TABLET | Freq: Every evening | ORAL | Status: DC
  Administered 2021-12-04: 25 mg via ORAL
  Filled 2021-12-04: qty 1

## 2021-12-04 MED ORDER — HYDROCODONE-ACETAMINOPHEN 5-325 MG PO TABS
ORAL_TABLET | ORAL | Status: AC
  Filled 2021-12-04: qty 1

## 2021-12-04 MED ORDER — PROPOFOL 10 MG/ML IV BOLUS
INTRAVENOUS | Status: DC | PRN
  Administered 2021-12-04: 150 mg via INTRAVENOUS

## 2021-12-04 MED ORDER — 0.9 % SODIUM CHLORIDE (POUR BTL) OPTIME
TOPICAL | Status: DC | PRN
  Administered 2021-12-04: 1000 mL

## 2021-12-04 MED ORDER — CHLORHEXIDINE GLUCONATE 0.12 % MT SOLN
15.0000 mL | Freq: Once | OROMUCOSAL | Status: AC
  Administered 2021-12-04: 15 mL via OROMUCOSAL
  Filled 2021-12-04: qty 15

## 2021-12-04 MED ORDER — AMISULPRIDE (ANTIEMETIC) 5 MG/2ML IV SOLN: 10.0000 mg | Freq: Once | INTRAVENOUS | Status: DC | PRN

## 2021-12-04 SURGICAL SUPPLY — 46 items
ADH SKN CLS APL DERMABOND .7 (GAUZE/BANDAGES/DRESSINGS) ×1
BAG COUNTER SPONGE SURGICOUNT (BAG) ×1 IMPLANT
BAG SPNG CNTER NS LX DISP (BAG) ×1
BLADE SURG 15 STRL LF DISP TIS (BLADE) IMPLANT
BLADE SURG 15 STRL SS (BLADE)
CANISTER SUCT 3000ML PPV (MISCELLANEOUS) ×1 IMPLANT
CLEANER TIP ELECTROSURG 2X2 (MISCELLANEOUS) ×1 IMPLANT
CNTNR URN SCR LID CUP LEK RST (MISCELLANEOUS) IMPLANT
CONT SPEC 4OZ STRL OR WHT (MISCELLANEOUS)
CORD BIPOLAR FORCEPS 12FT (ELECTRODE) ×1 IMPLANT
COVER SURGICAL LIGHT HANDLE (MISCELLANEOUS) ×1 IMPLANT
DERMABOND ADVANCED (GAUZE/BANDAGES/DRESSINGS) ×1
DERMABOND ADVANCED .7 DNX12 (GAUZE/BANDAGES/DRESSINGS) ×1 IMPLANT
DRAIN JACKSON RD 7FR 3/32 (WOUND CARE) IMPLANT
DRAIN SNY 10 ROU (WOUND CARE) IMPLANT
DRAPE HALF SHEET 40X57 (DRAPES) IMPLANT
ELECT COATED BLADE 2.86 ST (ELECTRODE) ×1 IMPLANT
ELECT REM PT RETURN 9FT ADLT (ELECTROSURGICAL) ×1
ELECTRODE REM PT RTRN 9FT ADLT (ELECTROSURGICAL) ×1 IMPLANT
EVACUATOR SILICONE 100CC (DRAIN) ×1 IMPLANT
FORCEPS BIPOLAR SPETZLER 8 1.0 (NEUROSURGERY SUPPLIES) ×1 IMPLANT
GAUZE 4X4 16PLY ~~LOC~~+RFID DBL (SPONGE) ×1 IMPLANT
GLOVE BIO SURGEON STRL SZ7.5 (GLOVE) ×1 IMPLANT
GOWN STRL REUS W/ TWL LRG LVL3 (GOWN DISPOSABLE) ×2 IMPLANT
GOWN STRL REUS W/TWL LRG LVL3 (GOWN DISPOSABLE) ×2
HEMOSTAT SURGICEL 2X14 (HEMOSTASIS) ×1 IMPLANT
KIT BASIN OR (CUSTOM PROCEDURE TRAY) ×1 IMPLANT
KIT TURNOVER KIT B (KITS) ×1 IMPLANT
LOCATOR NERVE 3 VOLT (DISPOSABLE) IMPLANT
NDL HYPO 25GX1X1/2 BEV (NEEDLE) ×1 IMPLANT
NEEDLE HYPO 25GX1X1/2 BEV (NEEDLE) ×1 IMPLANT
NS IRRIG 1000ML POUR BTL (IV SOLUTION) ×1 IMPLANT
PAD ARMBOARD 7.5X6 YLW CONV (MISCELLANEOUS) ×2 IMPLANT
PENCIL SMOKE EVACUATOR (MISCELLANEOUS) ×1 IMPLANT
POSITIONER HEAD DONUT 9IN (MISCELLANEOUS) IMPLANT
SHEARS HARMONIC 9CM CVD (BLADE) ×1 IMPLANT
SPONGE INTESTINAL PEANUT (DISPOSABLE) IMPLANT
STAPLER VISISTAT 35W (STAPLE) ×1 IMPLANT
SUT ETHILON 2 0 FS 18 (SUTURE) ×1 IMPLANT
SUT SILK 3 0 REEL (SUTURE) ×1 IMPLANT
SUT VIC AB 3-0 SH 27 (SUTURE) ×1
SUT VIC AB 3-0 SH 27X BRD (SUTURE) ×1 IMPLANT
SUT VICRYL 4-0 PS2 18IN ABS (SUTURE) ×1 IMPLANT
TOWEL GREEN STERILE FF (TOWEL DISPOSABLE) ×1 IMPLANT
TRAY ENT MC OR (CUSTOM PROCEDURE TRAY) ×1 IMPLANT
TUBE FEEDING 10FR FLEXIFLO (MISCELLANEOUS) IMPLANT

## 2021-12-04 NOTE — Transfer of Care (Signed)
Immediate Anesthesia Transfer of Care Note  Patient: Maverik Bencomo  Procedure(s) Performed: THYROID LOBECTOMY (Right: Neck)  Patient Location: PACU  Anesthesia Type:General  Level of Consciousness: awake and alert   Airway & Oxygen Therapy: Patient Spontanous Breathing and Patient connected to nasal cannula oxygen  Post-op Assessment: Report given to RN and Post -op Vital signs reviewed and stable  Post vital signs: Reviewed and stable  Last Vitals:  Vitals Value Taken Time  BP 167/86 12/04/21 1035  Temp 36.7 C 12/04/21 1035  Pulse 71 12/04/21 1036  Resp 13 12/04/21 1036  SpO2 98 % 12/04/21 1036  Vitals shown include unvalidated device data.  Last Pain:  Vitals:   12/04/21 0810  TempSrc:   PainSc: 0-No pain      Patients Stated Pain Goal: 1 (48/62/82 4175)  Complications: No notable events documented.

## 2021-12-04 NOTE — Anesthesia Procedure Notes (Signed)
Procedure Name: Intubation Date/Time: 12/04/2021 8:53 AM  Performed by: Terrence Dupont, CRNAPre-anesthesia Checklist: Patient identified, Emergency Drugs available, Suction available and Patient being monitored Patient Re-evaluated:Patient Re-evaluated prior to induction Oxygen Delivery Method: Circle system utilized Preoxygenation: Pre-oxygenation with 100% oxygen Induction Type: IV induction Ventilation: Mask ventilation without difficulty and Oral airway inserted - appropriate to patient size Laryngoscope Size: Mac and 4 Grade View: Grade II Tube type: Oral Tube size: 7.5 mm Number of attempts: 1 Airway Equipment and Method: Stylet and Oral airway Placement Confirmation: ETT inserted through vocal cords under direct vision, positive ETCO2 and breath sounds checked- equal and bilateral Secured at: 23 cm Tube secured with: Tape Dental Injury: Teeth and Oropharynx as per pre-operative assessment

## 2021-12-04 NOTE — H&P (Signed)
William Joseph is an 76 y.o. male.   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  He presents for lobectomy.  Past Medical History:  Diagnosis Date   Anxiety    Essential hypertension, benign    Headache    History of cardiac catheterization    Normal coronaries March 2011   History of non-Hodgkin's lymphoma 06/26/2009   Qualifier: Diagnosis of  By: Dwaine Gale, CNA, Sandy     Non Hodgkin's lymphoma ([place])    Nonerosive nonspecific gastritis    EGD - Dr. Laural Golden   Obsessive-compulsive disorder    Palpitations    Pre-diabetes    Sleep apnea    Thyroid mass     Past Surgical History:  Procedure Laterality Date   COLONOSCOPY N/A 06/29/2015   Procedure: COLONOSCOPY;  Surgeon: Daneil Dolin, MD;  [facility];  Service: Endoscopy;  Laterality: N/A;  9:30 Am - moved to 9:15 - office to notify   ESOPHAGOGASTRODUODENOSCOPY ENDOSCOPY N/A    EXPLORATORY LAPAROTOMY     GLAUCOMA SURGERY Bilateral 11/2013   didnt work   GOLD SEED IMPLANT N/A 05/06/2021   Procedure: GOLD SEED IMPLANT;  Surgeon: Cleon Gustin, MD;  Location: [facility];  Service: Urology;  Laterality: N/A;   LYMPH NODE BIOPSY     POLYPECTOMY  06/29/2015   Procedure: POLYPECTOMY;  Surgeon: Daneil Dolin, MD;  [facility];  Service: Endoscopy;;  descending colon polyp removed via cold snare   Port-a-cath placement     SPACE OAR INSTILLATION N/A 05/06/2021   Procedure: SPACE OAR INSTILLATION;  Surgeon: Cleon Gustin, MD;  Location[facility];  Service: Urology;  Laterality: N/A;   Tooth implant      Family History  Problem Relation Age of Onset   Diabetes Father    Coronary artery disease Mother    Hypertension Brother    Mental illness Maternal Uncle    Dementia Maternal Grandfather    Seizures Cousin    Drug abuse Other    Seizures Maternal Uncle    ADD / ADHD Neg Hx    Alcohol  abuse Neg Hx    Anxiety disorder Neg Hx    Bipolar disorder Neg Hx    Depression Neg Hx    OCD Neg Hx    Paranoid behavior Neg Hx    Schizophrenia Neg Hx    Sexual abuse Neg Hx    Physical abuse Neg Hx    Social History:  reports that he has been smoking cigars and cigarettes. He started smoking about 57 years ago. He has a 1.00 pack-year smoking history. He has never used smokeless tobacco. He reports current alcohol use. He reports that he does not use drugs.  Allergies:  Allergies  Allergen Reactions   Tomato Itching    Medications Prior to Admission  Medication Sig Dispense Refill   Aspirin-Salicylamide-Caffeine (BC HEADACHE POWDER PO) Take 1 packet by mouth daily as needed (headache).     B Complex Vitamins (VITAMIN B COMPLEX PO) Take 1 capsule by mouth in the morning.     fluticasone (FLONASE) 50 MCG/ACT nasal spray Place 1 spray into both nostrils daily as needed for allergies or rhinitis.     losartan (COZAAR) 25 MG tablet TAKE 1 TABLET(25 MG) BY MOUTH DAILY (Patient taking differently: Take 25 mg by mouth every evening.) 30 tablet 2   Multiple  Vitamin (MULTIVITAMIN WITH MINERALS) TABS tablet Take 1 tablet by mouth in the morning.     rosuvastatin (CRESTOR) 10 MG tablet Take 10 mg by mouth at bedtime.     sildenafil (REVATIO) 20 MG tablet Take 1 tablet (20 mg total) by mouth daily as needed (take 30-60 minutes prior to sexual activity). 30 tablet 3   SIMBRINZA 1-0.2 % SUSP Apply 1 drop to eye 2 (two) times daily.     tadalafil (CIALIS) 5 MG tablet TAKE 1 TABLET(5 MG) BY MOUTH DAILY 90 tablet 0   tamsulosin (FLOMAX) 0.4 MG CAPS capsule TAKE 1 CAPSULE BY MOUTH EVERY DAY (Patient taking differently: 0.4 mg in the morning and at bedtime.) 30 capsule 3   megestrol (MEGACE) 20 MG tablet Take 1 tablet (20 mg total) by mouth 2 (two) times daily as needed (for hot flashes). 30 tablet 3    Results for orders placed or performed during the hospital encounter of 12/04/21 (from the past  48 hour(s))  CBC per protocol     Status: Abnormal   Collection Time: 12/04/21  7:50 AM  Result Value Ref Range   WBC 4.3 4.0 - 10.5 K/uL   RBC 3.93 (L) 4.22 - 5.81 MIL/uL   Hemoglobin 11.9 (L) 13.0 - 17.0 g/dL   HCT 36.9 (L) 39.0 - 52.0 %   MCV 93.9 80.0 - 100.0 fL   MCH 30.3 26.0 - 34.0 pg   MCHC 32.2 30.0 - 36.0 g/dL   RDW 13.6 11.5 - 15.5 %   Platelets 143 (L) 150 - 400 K/uL   nRBC 0.0 0.0 - 0.2 %    Comment: Performed at [facility], [place] [address] [place], [place] [address]   No results found.  Review of Systems  All other systems reviewed and are negative.   Blood pressure (!) 185/89, pulse 62, temperature 97.9 F (36.6 C), temperature source Oral, resp. rate 18, height '6\' 2"'$  (1.88 m), weight 102.1 kg, SpO2 98 %. Physical Exam Constitutional:      Appearance: Normal appearance. He is normal weight.  HENT:     Head: Normocephalic and atraumatic.     Right Ear: External ear normal.     Left Ear: External ear normal.     Nose: Nose normal.     Mouth/Throat:     Mouth: Mucous membranes are moist.     Pharynx: Oropharynx is clear.  Eyes:     Extraocular Movements: Extraocular movements intact.     Conjunctiva/sclera: Conjunctivae normal.     Pupils: Pupils are equal, round, and reactive to light.  Cardiovascular:     Rate and Rhythm: Normal rate.  Pulmonary:     Effort: Pulmonary effort is normal.  Musculoskeletal:     Cervical back: Normal range of motion.  Skin:    General: Skin is warm and dry.  Neurological:     General: No focal deficit present.     Mental Status: He is alert and oriented to person, place, and time.  Psychiatric:        Mood and Affect: Mood normal.        Behavior: Behavior normal.        Thought Content: Thought content normal.        Judgment: Judgment normal.      Assessment/Plan Right papillary thyroid cancer  To OR for right thyroid lobectomy.  Overnight observation.  Melida Quitter, MD 12/04/2021, 8:19 AM

## 2021-12-04 NOTE — Op Note (Signed)
Preop diagnosis: Right thyroid papillary cancer Postop diagnosis: same Procedure: Right thyroid lobectomy Surgeon: Redmond Baseman Assist: None Anesth: General and local with 1% lidocaine with 1:100,000 epinephrine Compl: None Findings: Firm nodule in right upper middle gland. Description:  After discussing risks, benefits, and alternatives, the patient was brought to the operative suite and placed on the operative table in the supine position.  Anesthesia was induced and the patient was intubated by the anesthesia team without difficulty.  The eyes were taped closed and a shoulder roll was placed.  The thyroid incision was marked with a marking pen and injected with local anesthetic.  The neck was prepped and draped in sterile fashion.  The incision was made with a 15 blade scalpel and extended through the subcutaneous and platysma layers with Bovie electrocautery.  The midline raphe of the strap muscles was divided and strap muscles retracted over the right lobe.  Dissection was then performed around the lobe using the harmonic scalpel includig division of the superior and inferior pedicles and dissection around the lateral aspect.  The parathyroid glands were not well-visualized but dissection was kept along the capsule of the thyroid.  Dissection continued around the deep aspect of the lobe while retracting the lobe.  Staying along the capsule, the dissection continued to Berry's ligament that was dissected.  The midline of the gland was then incised allowing removal of the lobe.  It was passed to nursing for pathology.  The recurrent laryngeal nerve was not visualized.  Bleeding was controlled with Bipolar electrocautery after a Valsalva was given.  After irrigating copiously, a strip of Surgicell was laid in the depth of the wound.  The platysma layer was closed with 3-0 Vicryl in a simple, interrupted fashion.  The subcutaneous layer was similarly closed with 4-0 Vicryl and the skin was closed with glue.   Drapes were removed and the patient was cleaned off.  He was returned to anesthesia for wake-up, was extubated, and moved to the recovery room in stable condition.

## 2021-12-04 NOTE — Progress Notes (Signed)
Received call back from on call ENT Dr Wilburn Cornelia. Md notified of elevated BP and no ordered PRN meds. No new orders at this time.

## 2021-12-04 NOTE — Brief Op Note (Signed)
12/04/2021  10:23 AM  PATIENT:  Hubbard Robinson Subramanian  76 y.o. male  PRE-OPERATIVE DIAGNOSIS:  Thyroid cancer  POST-OPERATIVE DIAGNOSIS:  Thyroid cancer  PROCEDURE:  Procedure(s): THYROID LOBECTOMY (Right)  SURGEON:  Surgeon(s) and Role:    Melida Quitter, MD - Primary  PHYSICIAN ASSISTANT:   ASSISTANTS: none   ANESTHESIA:   general  EBL:  Minimal   BLOOD ADMINISTERED:none  DRAINS: none   LOCAL MEDICATIONS USED:  LIDOCAINE   SPECIMEN:  Source of Specimen:  Right thyroid lobe  DISPOSITION OF SPECIMEN:  PATHOLOGY  COUNTS:  YES  TOURNIQUET:  * No tourniquets in log *  DICTATION: .Note written in EPIC  PLAN OF CARE: Admit for overnight observation  PATIENT DISPOSITION:  PACU - hemodynamically stable.   Delay start of Pharmacological VTE agent (>24hrs) due to surgical blood loss or risk of bleeding: no

## 2021-12-04 NOTE — Progress Notes (Signed)
MD on call Dr. Wilburn Cornelia informed that patient bp 170/106 no orders received

## 2021-12-04 NOTE — Progress Notes (Addendum)
Pt's SBP>190 and DBP>100 upon arrival from PACU.This is unchanged from last documented BP in PACU.   BP rechecked SBP 180s DBP 80s. Scheduled losartan given.  Pt remains awake alert and oriented at baseline; reports 5/10 headache but no surgical pain. Will notify MD

## 2021-12-05 ENCOUNTER — Encounter (HOSPITAL_COMMUNITY): Payer: Self-pay | Admitting: Otolaryngology

## 2021-12-05 DIAGNOSIS — C73 Malignant neoplasm of thyroid gland: Secondary | ICD-10-CM | POA: Diagnosis not present

## 2021-12-05 LAB — SURGICAL PATHOLOGY

## 2021-12-05 MED ORDER — HYDROCODONE-ACETAMINOPHEN 5-325 MG PO TABS: 1.0000 | ORAL_TABLET | Freq: Four times a day (QID) | ORAL | 0 refills | Status: DC | PRN

## 2021-12-05 NOTE — Discharge Summary (Signed)
Physician Discharge Summary  Patient ID: William Joseph MRN: [id number] DOB/AGE: [birth year] 76 y.o.  Admit date: 12/04/2021 Discharge date: 12/05/2021  Admission Diagnoses: Papillary thyroid cancer  Discharge Diagnoses:  Principal Problem:   Papillary thyroid carcinoma [facility])   Discharged Condition: good  Hospital Course: 76 year old male with FNA-proven right thyroid papillary cancer who presented for surgical management.  See operative note.  He was observed overnight and did well aside from elevated blood pressure that improved by morning.  On POD 1, he is felt stable for discharge.  Consults: None  Significant Diagnostic Studies: None  Treatments: surgery: Right thyroid lobectomy  Discharge Exam: Blood pressure (!) 159/77, pulse 65, temperature 98.4 F (36.9 C), temperature source Oral, resp. rate 17, height '6\' 2"'$  (1.88 m), weight 102.1 kg, SpO2 97 %. General appearance: alert, cooperative, and no distress Neck: thyroid incision clean and intact, no fluid collection, normal voice  Disposition: Discharge disposition: 01-Home or Self Care       Discharge Instructions     Diet - low sodium heart healthy   Complete by: As directed    Discharge instructions   Complete by: As directed    Resume normal diet.  Avoid strenuous activity.  OK to allow incision to get wet, gently pat dry.  Do not apply ointment to incision.   Increase activity slowly   Complete by: As directed    No wound care   Complete by: As directed       Allergies as of 12/05/2021       Reactions   Tomato Itching        Medication List     TAKE these medications    BC HEADACHE POWDER PO Take 1 packet by mouth daily as needed (headache).   fluticasone 50 MCG/ACT nasal spray Commonly known as: FLONASE Place 1 spray into both nostrils daily as needed for allergies or rhinitis.   HYDROcodone-acetaminophen 5-325 MG tablet Commonly known as: NORCO/VICODIN Take 1-2 tablets by mouth  every 6 (six) hours as needed for moderate pain.   losartan 25 MG tablet Commonly known as: COZAAR TAKE 1 TABLET(25 MG) BY MOUTH DAILY What changed: See the new instructions.   megestrol 20 MG tablet Commonly known as: MEGACE Take 1 tablet (20 mg total) by mouth 2 (two) times daily as needed (for hot flashes).   multivitamin with minerals Tabs tablet Take 1 tablet by mouth in the morning.   rosuvastatin 10 MG tablet Commonly known as: CRESTOR Take 10 mg by mouth at bedtime.   sildenafil 20 MG tablet Commonly known as: REVATIO Take 1 tablet (20 mg total) by mouth daily as needed (take 30-60 minutes prior to sexual activity).   Simbrinza 1-0.2 % Susp Generic drug: Brinzolamide-Brimonidine Apply 1 drop to eye 2 (two) times daily.   tadalafil 5 MG tablet Commonly known as: CIALIS TAKE 1 TABLET(5 MG) BY MOUTH DAILY   tamsulosin 0.4 MG Caps capsule Commonly known as: FLOMAX TAKE 1 CAPSULE BY MOUTH EVERY DAY What changed:  how to take this when to take this   VITAMIN B COMPLEX PO Take 1 capsule by mouth in the morning.        Follow-up Information     Melida Quitter, MD. Schedule an appointment as soon as possible for a visit in 1 week(s).   Specialty: Otolaryngology Contact information: [address] [place] [address] [phone]  Signed: Melida Quitter 12/05/2021, 8:21 AM

## 2021-12-05 NOTE — Anesthesia Postprocedure Evaluation (Signed)
Anesthesia Post Note  Patient: William Joseph  Procedure(s) Performed: THYROID LOBECTOMY (Right: Neck)     Patient location during evaluation: PACU Anesthesia Type: General Level of consciousness: sedated and patient cooperative Pain management: pain level controlled Vital Signs Assessment: post-procedure vital signs reviewed and stable Respiratory status: spontaneous breathing Cardiovascular status: stable Anesthetic complications: no   No notable events documented.  Last Vitals:  Vitals:   12/05/21 0438 12/05/21 0758  BP: (!) 181/80 (!) 159/77  Pulse: (!) 59 65  Resp:  17  Temp: 36.8 C 36.9 C  SpO2: 99% 97%    Last Pain:  Vitals:   12/05/21 1008  TempSrc:   PainSc: [place]

## 2021-12-05 NOTE — Plan of Care (Signed)

## 2021-12-05 NOTE — Progress Notes (Signed)
Discharge instructions reviewed with pt and wife.  Copy of instructions given to pt., informed script sent to his pharmacy.   @ 1011 Pt d/c'd via wheelchair with belongings, with family.            Escorted by unit staff.

## 2021-12-12 ENCOUNTER — Other Ambulatory Visit: Payer: Self-pay | Admitting: Otolaryngology

## 2021-12-12 DIAGNOSIS — C73 Malignant neoplasm of thyroid gland: Secondary | ICD-10-CM

## 2022-01-20 ENCOUNTER — Ambulatory Visit: Payer: Federal, State, Local not specified - PPO | Admitting: Urology

## 2022-02-03 ENCOUNTER — Other Ambulatory Visit: Payer: Federal, State, Local not specified - PPO

## 2022-02-03 DIAGNOSIS — R972 Elevated prostate specific antigen [PSA]: Secondary | ICD-10-CM

## 2022-02-04 LAB — TESTOSTERONE: Testosterone: 234 ng/dL — ABNORMAL LOW (ref 264–916)

## 2022-02-04 LAB — PSA: Prostate Specific Ag, Serum: <0.1 ng/mL (ref 0.0–4.0)

## 2022-02-10 ENCOUNTER — Ambulatory Visit (INDEPENDENT_AMBULATORY_CARE_PROVIDER_SITE_OTHER): Payer: Federal, State, Local not specified - PPO | Admitting: Urology

## 2022-02-10 ENCOUNTER — Encounter: Payer: Self-pay | Admitting: Urology

## 2022-02-10 VITALS — BP 177/84 | HR 66

## 2022-02-10 DIAGNOSIS — C61 Malignant neoplasm of prostate: Secondary | ICD-10-CM | POA: Diagnosis not present

## 2022-02-10 LAB — URINALYSIS, ROUTINE W REFLEX MICROSCOPIC
Bilirubin, UA: NEGATIVE
Glucose, UA: NEGATIVE
Ketones, UA: NEGATIVE
Leukocytes,UA: NEGATIVE
Nitrite, UA: NEGATIVE
Protein,UA: NEGATIVE
RBC, UA: NEGATIVE
Specific Gravity, UA: 1.02 (ref 1.005–1.030)
Urobilinogen, Ur: 1 mg/dL (ref 0.2–1.0)
pH, UA: 7 (ref 5.0–7.5)

## 2022-02-10 MED ORDER — TADALAFIL 10 MG PO TABS: 10.0000 mg | ORAL_TABLET | Freq: Every day | ORAL | 3 refills | Status: DC | PRN

## 2022-02-10 NOTE — Progress Notes (Signed)
02/10/2022 10:45 AM   William Joseph 1946-01-09 [id number]  Referring provider: Celene Squibb, MD [address] [name],  [place] [address]  No chief complaint on file.   HPI:  F/u -    1) prostate cancer -diagnosed with intermediate to high risk prostate cancer November 2022 with a PSA 10.1 (20.2 on 5ari which he stopped) and treated with ST-ADT (04/26/2021 - 10/24/2021) and IMRT (April 2023) with Dr. Lynnette Caffey.    His Jul 2023 PSA is udt and T < 3.   Biopsy: November 2022-intermediate to high risk PSA 10.1 (20.2)-dutasteride use T1c (hypoechoic areas were noted in the left prostate on ultrasound) Prostate 25 g Gleason 4+3=7, 3 cores, 60 to 80%, left Gleason 3+4=7, 2 cores, 20 to 40%, left 5/12   Staging: -December 2022 PET scan-left hemiprostate activity but no metastatic disease.  Pulmonary nodules, right neck activity adjacent to the thyroid gland. -December 2022 thyroid ultrasound-1.1 cm right mid hypoechoic prostate nodule noted to be a TR 5 risk category lesion. -December 2022 CT chest-benign appearing pulmonary nodules.  Repeat chest CT around June 2023 -Jul 2023 - CT chest - Stable 1.7 cm ground-glass nodule in lingula. Continued follow-up by chest CT is recommended in 2 years (in 2025 and until 5 years of stability - 2027)     2) BPH - on tamsulosin and tadalafil daily. AUASS = 14 now 24. Symptoms of freq, urge, and nocturia. His PVR is 0 ml. Stop dutasteride Apr 2023.    3) ED - he is on daily tadlafil with mixed results. He has trouble getting and maintaining erection.    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   He is seen for the above. Dr. Redmond Baseman removed his thyroid and he has thyroid cancer under surveillance. He has bothersome hot flashes. His Oct 2023 T 234, PSA < 0.1. Libido is back and having erections. Not taking pde5i.    PMH: Past Medical History:  Diagnosis Date   Anxiety    Essential hypertension, benign     Headache    History of cardiac catheterization    Normal coronaries March 2011   History of non-Hodgkin's lymphoma 06/26/2009   Qualifier: Diagnosis of  By: Dwaine Gale, CNA, Sandy     Non Hodgkin's lymphoma ([place])    Nonerosive nonspecific gastritis    EGD - Dr. Laural Golden   Obsessive-compulsive disorder    Palpitations    Pre-diabetes    Sleep apnea    Thyroid mass     Surgical History: Past Surgical History:  Procedure Laterality Date   COLONOSCOPY N/A 06/29/2015   Procedure: COLONOSCOPY;  Surgeon: Daneil Dolin, MD;  [facility];  Service: Endoscopy;  Laterality: N/A;  9:30 Am - moved to 9:15 - office to notify   ESOPHAGOGASTRODUODENOSCOPY ENDOSCOPY N/A    EXPLORATORY LAPAROTOMY     GLAUCOMA SURGERY Bilateral 11/2013   didnt work   GOLD SEED IMPLANT N/A 05/06/2021   Procedure: GOLD SEED IMPLANT;  Surgeon: Cleon Gustin, MD;  Location[facility];  Service: Urology;  Laterality: N/A;   LYMPH NODE BIOPSY     POLYPECTOMY  06/29/2015   Procedure: POLYPECTOMY;  Surgeon: Daneil Dolin, MD;  [facility];  Service: Endoscopy;;  descending colon polyp removed via cold snare   Port-a-cath placement     SPACE OAR INSTILLATION N/A 05/06/2021   Procedure: SPACE OAR INSTILLATION;  Surgeon: Cleon Gustin, MD;  Location: [facility];  Service: Urology;  Laterality: N/A;   THYROID LOBECTOMY Right 12/04/2021   Procedure: THYROID LOBECTOMY;  Surgeon: Melida Quitter, MD;  Location: [facility];  Service: ENT;  Laterality: Right;   Tooth implant      Home Medications:  Allergies as of 02/10/2022       Reactions   Tomato Itching        Medication List        Accurate as of February 10, 2022 10:45 AM. If you have any questions, ask your nurse or doctor.          BC HEADACHE POWDER PO Take 1 packet by mouth daily as needed (headache).   fluticasone 50 MCG/ACT nasal spray Commonly known as: FLONASE Place 1 spray into both nostrils daily as needed for allergies or  rhinitis.   HYDROcodone-acetaminophen 5-325 MG tablet Commonly known as: NORCO/VICODIN Take 1-2 tablets by mouth every 6 (six) hours as needed for moderate pain.   losartan 25 MG tablet Commonly known as: COZAAR TAKE 1 TABLET(25 MG) BY MOUTH DAILY What changed: See the new instructions.   megestrol 20 MG tablet Commonly known as: MEGACE Take 1 tablet (20 mg total) by mouth 2 (two) times daily as needed (for hot flashes).   multivitamin with minerals Tabs tablet Take 1 tablet by mouth in the morning.   rosuvastatin 10 MG tablet Commonly known as: CRESTOR Take 10 mg by mouth at bedtime.   sildenafil 20 MG tablet Commonly known as: REVATIO Take 1 tablet (20 mg total) by mouth daily as needed (take 30-60 minutes prior to sexual activity).   Simbrinza 1-0.2 % Susp Generic drug: Brinzolamide-Brimonidine Apply 1 drop to eye 2 (two) times daily.   tadalafil 5 MG tablet Commonly known as: CIALIS TAKE 1 TABLET(5 MG) BY MOUTH DAILY   tamsulosin 0.4 MG Caps capsule Commonly known as: FLOMAX TAKE 1 CAPSULE BY MOUTH EVERY DAY What changed:  how to take this when to take this   VITAMIN B COMPLEX PO Take 1 capsule by mouth in the morning.        Allergies:  Allergies  Allergen Reactions   Tomato Itching    Family History: Family History  Problem Relation Age of Onset   Diabetes Father    Coronary artery disease Mother    Hypertension Brother    Mental illness Maternal Uncle    Dementia Maternal Grandfather    Seizures Cousin    Drug abuse Other    Seizures Maternal Uncle    ADD / ADHD Neg Hx    Alcohol abuse Neg Hx    Anxiety disorder Neg Hx    Bipolar disorder Neg Hx    Depression Neg Hx    OCD Neg Hx    Paranoid behavior Neg Hx    Schizophrenia Neg Hx    Sexual abuse Neg Hx    Physical abuse Neg Hx     Social History:  reports that he has been smoking cigars and cigarettes. He started smoking about 57 years ago. He has a 1.00 pack-year smoking history.  He has never used smokeless tobacco. He reports current alcohol use. He reports that he does not use drugs.   Physical Exam: BP (!) 177/84   Pulse 66   Constitutional:  Alert and oriented, No acute distress. HEENT: [place] AT, moist mucus membranes.  Trachea midline, no masses. Cardiovascular: No clubbing, cyanosis, or edema. Respiratory: Normal respiratory effort, no increased work of breathing. GI: Abdomen is soft, nontender, nondistended, no abdominal masses GU: No  CVA tenderness Skin: No rashes, bruises or suspicious lesions. Neurologic: Grossly intact, no focal deficits, moving all 4 extremities. Psychiatric: Normal mood and affect.  Laboratory Data: Lab Results  Component Value Date   WBC 4.3 12/04/2021   HGB 11.9 (L) 12/04/2021   HCT 36.9 (L) 12/04/2021   MCV 93.9 12/04/2021   PLT 143 (L) 12/04/2021    Lab Results  Component Value Date   CREATININE 1.06 12/04/2021    Lab Results  Component Value Date   PSA 5.4 05/18/2017    Lab Results  Component Value Date   TESTOSTERONE 234 (L) 02/03/2022    Lab Results  Component Value Date   HGBA1C 6.2 (H) 05/02/2021    Urinalysis    Component Value Date/Time   APPEARANCEUR Clear 10/21/2021 1043   GLUCOSEU Negative 10/21/2021 1043   BILIRUBINUR Negative 10/21/2021 1043   PROTEINUR Trace (A) 10/21/2021 1043   NITRITE Negative 10/21/2021 1043   LEUKOCYTESUR Negative 10/21/2021 1043    Lab Results  Component Value Date   LABMICR Comment 10/21/2021    Pertinent Imaging:  Assessment & Plan:    1. Prostate cancer -  PSA and T look great.  - Urinalysis, Routine w reflex microscopic  2. ED - disc nature r/b/a to pde5i and he will try a 10 mg.   No follow-ups on file.  Festus Aloe, MD  [facility]  [address] [place], [place] [address] [phone]

## 2022-03-12 NOTE — H&P (Signed)
Surgical History & Physical  Patient Name: William Joseph DOB: 25-Apr-1945  Surgery: Cataract extraction with intraocular lens implant phacoemulsification & Goniotomy Treatment (iAccess); Left Eye  Surgeon: Baruch Goldmann MD Surgery Date:  03-17-22 Pre-Op Date:  03-06-22  HPI: A 47 Yr. old male patient 1. The patient is returning for a HVF 24-2, OCT rNFL, Complete/Dilated 3 weeks follow-up of both eyes. Since the last visit, his eyes seems better, still some redness.. Patient is not taking medications, stopped Simbrinza. Patient also notes worsening blurry vision in both eyes, with the right eye being worse. He has a significant amount of glare with headlights when driving in the dark, such that he no longer feels safe driving at night and no longer does so. This is negatively affecting the patient's quality of life and the patient is unable to function adequately in life with the current level of vision. HPI Completed by Dr. Baruch Goldmann  Medical History: Glaucoma Ptosis OU, Presbyopia OU Glaucoma No family hx of macular degeneration  Review of Systems Allergic/Immunologic Seasonal Allergies All recorded systems are negative except as noted above.  Social   Never smoked   Medication Simbrinza,  Claritin,   Sx/Procedures SLT Trabeculoplasty, SLT Trabeculoplasty, SLT, SLT,    Drug Allergies   NKDA  History & Physical: Heent: Cataract & Glaucoma; left eye NECK: supple without bruits LUNGS: lungs clear to auscultation CV: regular rate and rhythm Abdomen: soft and non-tender Impression & Plan: Assessment: 1.  COMBINED FORMS AGE RELATED CATARACT; Both Eyes (H25.813) 2.  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  Acute Conjunctivitis; Both Eyes (H10.33) - Resolved  Plan: 1.  Cataract accounts for the patient's decreased vision. This visual impairment is not correctable with a tolerable change in glasses or contact lenses. Cataract surgery with an  implantation of a new lens should significantly improve the visual and functional status of the patient. Discussed all risks, benefits, alternatives, and potential complications. Discussed the procedures and recovery. Patient desires to have surgery. A-scan ordered and performed today for intra-ocular lens calculations. The surgery will be performed in order to improve vision for driving, reading, and for eye examinations. Recommend phacoemulsification with intra-ocular lens. Recommend Dextenza for post-operative pain and inflammation. Right Eye worse - first. Dilates poorly - shugarcaine by protocol. Malyugin Ring. Omidira. Recommend iAccess goniotomy to control pressure in setting of glaucoma.  2.  Stable. 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  Findings, prognosis and treatment options reviewed.  OCT rNFL stable 11/23 HVF 24-2 11/23 Glaucoma is a slow progressive disease often causing painless vision loss. Compliance with treatment discussed, importance of regular follow ups and regular testing understood. Detailed discussion about glaucoma today including importance of maintaining good follow up and following treatment plan, and the possibility of irreversible blindness as part of this disease process. Patient has had trouble with allergies and intolerances to drops. Recommend goniotomy (iAccess) OU to help lower eye pressure.  3.  Improved since starting [facility].

## 2022-03-13 ENCOUNTER — Other Ambulatory Visit: Payer: Self-pay

## 2022-03-13 ENCOUNTER — Encounter (HOSPITAL_COMMUNITY): Payer: Self-pay

## 2022-03-13 ENCOUNTER — Encounter (HOSPITAL_COMMUNITY)
Admission: RE | Admit: 2022-03-13 | Discharge: 2022-03-13 | Disposition: A | Discharge status: Home / Self Care | Payer: Federal, State, Local not specified - PPO | Source: Ambulatory Visit | Attending: Ophthalmology | Admitting: Ophthalmology

## 2022-03-17 ENCOUNTER — Encounter (HOSPITAL_COMMUNITY)
Admission: RE | Disposition: A | Discharge status: Home / Self Care | Payer: Self-pay | Source: Home / Self Care | Attending: Ophthalmology

## 2022-03-17 ENCOUNTER — Ambulatory Visit (HOSPITAL_COMMUNITY): Payer: Federal, State, Local not specified - PPO | Admitting: Certified Registered Nurse Anesthetist

## 2022-03-17 ENCOUNTER — Encounter (HOSPITAL_COMMUNITY): Payer: Self-pay | Admitting: Ophthalmology

## 2022-03-17 ENCOUNTER — Ambulatory Visit (HOSPITAL_COMMUNITY)
Admission: RE | Admit: 2022-03-17 | Discharge: 2022-03-17 | Disposition: A | Discharge status: Home / Self Care | Payer: Federal, State, Local not specified - PPO | Attending: Ophthalmology | Admitting: Ophthalmology

## 2022-03-17 DIAGNOSIS — H1033 Unspecified acute conjunctivitis, bilateral: Secondary | ICD-10-CM | POA: Diagnosis not present

## 2022-03-17 DIAGNOSIS — D759 Disease of blood and blood-forming organs, unspecified: Secondary | ICD-10-CM | POA: Diagnosis not present

## 2022-03-17 DIAGNOSIS — H401111 Primary open-angle glaucoma, right eye, mild stage: Secondary | ICD-10-CM | POA: Diagnosis not present

## 2022-03-17 DIAGNOSIS — H2181 Floppy iris syndrome: Secondary | ICD-10-CM | POA: Diagnosis not present

## 2022-03-17 DIAGNOSIS — D649 Anemia, unspecified: Secondary | ICD-10-CM | POA: Insufficient documentation

## 2022-03-17 DIAGNOSIS — I10 Essential (primary) hypertension: Secondary | ICD-10-CM | POA: Diagnosis not present

## 2022-03-17 DIAGNOSIS — G4730 Sleep apnea, unspecified: Secondary | ICD-10-CM | POA: Insufficient documentation

## 2022-03-17 DIAGNOSIS — H25811 Combined forms of age-related cataract, right eye: Secondary | ICD-10-CM | POA: Insufficient documentation

## 2022-03-17 HISTORY — PX: CATARACT EXTRACTION W/PHACO: SHX586

## 2022-03-17 SURGERY — PHACOEMULSIFICATION, CATARACT, WITH IOL INSERTION
Anesthesia: Monitor Anesthesia Care | Site: Eye | Laterality: Right

## 2022-03-17 MED ORDER — PHENYLEPHRINE HCL 2.5 % OP SOLN
1.0000 [drp] | OPHTHALMIC | Status: AC | PRN
Start: 2022-03-17 — End: 2022-03-17
  Administered 2022-03-17 (×3): 1 [drp] via OPHTHALMIC

## 2022-03-17 MED ORDER — TRYPAN BLUE 0.06 % IO SOSY
PREFILLED_SYRINGE | INTRAOCULAR | Status: AC
  Filled 2022-03-17: qty 0.5

## 2022-03-17 MED ORDER — SODIUM HYALURONATE 23MG/ML IO SOSY
PREFILLED_SYRINGE | INTRAOCULAR | Status: DC | PRN
  Administered 2022-03-17: .6 mL via INTRAOCULAR

## 2022-03-17 MED ORDER — STERILE WATER FOR IRRIGATION IR SOLN
Status: DC | PRN
  Administered 2022-03-17: 250 mL

## 2022-03-17 MED ORDER — LIDOCAINE HCL (PF) 1 % IJ SOLN
INTRAOCULAR | Status: DC | PRN
  Administered 2022-03-17: 1 mL via OPHTHALMIC

## 2022-03-17 MED ORDER — PHENYLEPHRINE-KETOROLAC 1-0.3 % IO SOLN
INTRAOCULAR | Status: DC | PRN
  Administered 2022-03-17: 500 mL via OPHTHALMIC

## 2022-03-17 MED ORDER — LIDOCAINE HCL 3.5 % OP GEL
1.0000 | Freq: Once | OPHTHALMIC | Status: AC
  Administered 2022-03-17: 1 via OPHTHALMIC

## 2022-03-17 MED ORDER — BSS IO SOLN
INTRAOCULAR | Status: DC | PRN
  Administered 2022-03-17: 15 mL via INTRAOCULAR

## 2022-03-17 MED ORDER — TROPICAMIDE 1 % OP SOLN
1.0000 [drp] | OPHTHALMIC | Status: AC | PRN
  Administered 2022-03-17 (×3): 1 [drp] via OPHTHALMIC

## 2022-03-17 MED ORDER — MOXIFLOXACIN HCL 0.5 % OP SOLN
OPHTHALMIC | Status: DC | PRN
  Administered 2022-03-17: .2 mL via OPHTHALMIC

## 2022-03-17 MED ORDER — TETRACAINE HCL 0.5 % OP SOLN
1.0000 [drp] | OPHTHALMIC | Status: AC | PRN
  Administered 2022-03-17 (×3): 1 [drp] via OPHTHALMIC

## 2022-03-17 MED ORDER — POVIDONE-IODINE 5 % OP SOLN
OPHTHALMIC | Status: DC | PRN
  Administered 2022-03-17: 1 via OPHTHALMIC

## 2022-03-17 MED ORDER — PHENYLEPHRINE-KETOROLAC 1-0.3 % IO SOLN
INTRAOCULAR | Status: AC
  Filled 2022-03-17: qty 4

## 2022-03-17 MED ORDER — MIDAZOLAM HCL 5 MG/5ML IJ SOLN
INTRAMUSCULAR | Status: DC | PRN
  Administered 2022-03-17: 2 mg via INTRAVENOUS

## 2022-03-17 MED ORDER — EPINEPHRINE PF 1 MG/ML IJ SOLN
INTRAMUSCULAR | Status: AC
  Filled 2022-03-17: qty 2

## 2022-03-17 MED ORDER — MIDAZOLAM HCL 2 MG/2ML IJ SOLN
INTRAMUSCULAR | Status: AC
  Filled 2022-03-17: qty 2

## 2022-03-17 MED ORDER — MOXIFLOXACIN HCL 5 MG/ML IO SOLN
INTRAOCULAR | Status: AC
  Filled 2022-03-17: qty 1

## 2022-03-17 MED ORDER — SODIUM HYALURONATE 10 MG/ML IO SOLUTION
PREFILLED_SYRINGE | INTRAOCULAR | Status: DC | PRN
  Administered 2022-03-17: .85 mL via INTRAOCULAR

## 2022-03-17 SURGICAL SUPPLY — 18 items
CATARACT SUITE SIGHTPATH (MISCELLANEOUS) ×1 IMPLANT
CLIP IPRISM (KITS) IMPLANT
CLOTH BEACON ORANGE TIMEOUT ST (SAFETY) ×1 IMPLANT
EYE SHIELD UNIVERSAL CLEAR (GAUZE/BANDAGES/DRESSINGS) IMPLANT
FEE CATARACT SUITE SIGHTPATH (MISCELLANEOUS) ×1 IMPLANT
GLOVE BIOGEL PI IND STRL 7.0 (GLOVE) ×2 IMPLANT
GLOVE SURG SS PI 7.0 STRL IVOR (GLOVE) IMPLANT
LENS IOL RAYNER 18.5 (Intraocular Lens) ×1 IMPLANT
LENS IOL RAYONE EMV 18.5 (Intraocular Lens) IMPLANT
NDL HYPO 18GX1.5 BLUNT FILL (NEEDLE) ×2 IMPLANT
NEEDLE HYPO 18GX1.5 BLUNT FILL (NEEDLE) ×2 IMPLANT
PAD ARMBOARD 7.5X6 YLW CONV (MISCELLANEOUS) ×1 IMPLANT
RING MALYGIN 7.0 (MISCELLANEOUS) IMPLANT
SYR TB 1ML LL NO SAFETY (SYRINGE) ×1 IMPLANT
TAPE SURG TRANSPORE 1 IN (GAUZE/BANDAGES/DRESSINGS) IMPLANT
TAPE SURGICAL TRANSPORE 1 IN (GAUZE/BANDAGES/DRESSINGS) ×1
TREPHINE GLAUKO IACCESS TRBCLR (OPHTHALMIC) IMPLANT
WATER STERILE IRR 250ML POUR (IV SOLUTION) ×1 IMPLANT

## 2022-03-17 NOTE — Anesthesia Preprocedure Evaluation (Signed)
Anesthesia Evaluation  Patient identified by MRN, date of birth, ID band Patient awake    Reviewed: Allergy & Precautions, H&P , NPO status , Patient's Chart, lab work & pertinent test results, reviewed documented beta blocker date and time   Airway Mallampati: II  TM Distance: >3 FB Neck ROM: full    Dental no notable dental hx.    Pulmonary neg pulmonary ROS, shortness of breath, sleep apnea , Current Smoker   Pulmonary exam normal breath sounds clear to auscultation       Cardiovascular Exercise Tolerance: Good hypertension, negative cardio ROS  Rhythm:regular Rate:Normal     Neuro/Psych  Headaches PSYCHIATRIC DISORDERS Anxiety Depression     Neuromuscular disease negative neurological ROS  negative psych ROS   GI/Hepatic negative GI ROS, Neg liver ROS,,,  Endo/Other  negative endocrine ROS    Renal/GU negative Renal ROS  negative genitourinary   Musculoskeletal   Abdominal   Peds  Hematology negative hematology ROS (+) Blood dyscrasia, anemia   Anesthesia Other Findings   Reproductive/Obstetrics negative OB ROS                             Anesthesia Physical Anesthesia Plan  ASA: 3  Anesthesia Plan: MAC   Post-op Pain Management:    Induction:   PONV Risk Score and Plan:   Airway Management Planned:   Additional Equipment:   Intra-op Plan:   Post-operative Plan:   Informed Consent: I have reviewed the patients History and Physical, chart, labs and discussed the procedure including the risks, benefits and alternatives for the proposed anesthesia with the patient or authorized representative who has indicated his/her understanding and acceptance.     Dental Advisory Given  Plan Discussed with: CRNA  Anesthesia Plan Comments:        Anesthesia Quick Evaluation

## 2022-03-17 NOTE — Transfer of Care (Signed)
Immediate Anesthesia Transfer of Care Note  Patient: William Joseph  Procedure(s) Performed: CATARACT EXTRACTION PHACO AND INTRAOCULAR LENS PLACEMENT (IOC) (Right: Eye) Goniotomy (Right: Eye)  Patient Location: PACU  Anesthesia Type:MAC  Level of Consciousness: awake  Airway & Oxygen Therapy: Patient Spontanous Breathing  Post-op Assessment: Report given to RN and Post -op Vital signs reviewed and stable  Post vital signs: Reviewed and stable  Last Vitals:  Vitals Value Taken Time  BP    Temp    Pulse    Resp    SpO2      Last Pain:  Vitals:   03/17/22 1234  TempSrc: Oral  PainSc: 0-No pain         Complications: No notable events documented.

## 2022-03-17 NOTE — Discharge Instructions (Signed)
Please discharge patient when stable, will follow up today with Dr. Joyel Chenette at the [facility] [place] office immediately following discharge.  Leave shield in place until visit.  All paperwork with discharge instructions will be given at the office.  [facility] Address:  [address]  [place],  [address]  

## 2022-03-17 NOTE — Op Note (Signed)
Date of procedure: 03/17/22  Pre-operative diagnosis:  1) Visually significant age-related cataract, Right Eye (H25.811) 2) Primary Open Angle Glaucoma, mild Stage, Right Eye 3) Poor dilation, right eye  Post-operative diagnosis: 1) Visually significant age-related cataract, Right Eye 2) Primary Open Angle Glaucoma, mild Stage, Right Eye 3) Intra-operative floppy iris syndrome, right eye  Procedure:  1) Complex Removal of cataract via phacoemulsification and insertion of intra-ocular lens Rayner RAO200E +18.5D into the capsular bag of the Right Eye 2) Goniotomy treatment of nasal trabecular meshwork with iAccess, Right Eye  Attending surgeon: Gerda Diss. Jerrika Ledlow, MD, MA  Anesthesia: MAC, Topical Akten  Complications: None  Estimated Blood Loss: <76m (minimal)  Specimens: None  Implants: As above  Indications:  Visually significant age-related cataract, Right Eye; Glaucoma, Right Eye  Procedure:  The patient was seen and identified in the pre-operative area. The operative eye was identified and dilated.  The operative eye was marked.  Topical anesthesia was administered to the operative eye.     The patient was then to the operative suite and placed in the supine position.  A timeout was performed confirming the patient, procedure to be performed, and all other relevant information.   The patient's face was prepped and draped in the usual fashion for intra-ocular surgery.  A lid speculum was placed into the operative eye and the surgical microscope moved into place and focused. Poor dilation of the iris was confirmed. A superotemporal paracentesis was created using a 20 gauge paracentesis blade.  Shugarcaine was injected into the anterior chamber.  Viscoelastic was injected into the anterior chamber.  A temporal clear-corneal main wound incision was created using a 2.464mmicrokeratome. A 7.24m66malyugin ring was placed. A continuous curvilinear capsulorrhexis was initiated using an  irrigating cystitome and completed using capsulorrhexis forceps.  Hydrodissection and hydrodeliniation were performed.  Viscoelastic was injected into the anterior chamber.  A phacoemulsification handpiece and a chopper as a second instrument were used to remove the nucleus and epinucleus. The irrigation/aspiration handpiece was used to remove any remaining cortical material.   The capsular bag was reinflated with viscoelastic, checked, and found to be intact.  The intraocular lens was inserted into the capsular bag and dialed into place using a Kuglen hook.    The patient's head was repositioned.  The iAccess was used to extensively treat the nasal trabecular meshwork for 90 degrees for a total of 6 goniotomies under gonioscopy.  The patient's head was returned to neutral. The Malyugin ring was removed. The irrigation/aspiration handpiece was used to remove any remaining viscoelastic.  The clear corneal wound and paracentesis wounds were then hydrated and checked with Weck-Cels to be watertight. 0.1mL27m moxifloxacin was injected into the anterior chamber.  The lid-speculum was removed.  The drape was removed.  The patient's face was cleaned with a wet and dry 4x4.  A clear shield was taped over the eye. The patient was taken to the post-operative care unit in good condition, having tolerated the procedure well.  Post-Op Instructions: The patient will follow up at [facility] a same day post-operative evaluation and will receive all other orders and instructions.

## 2022-03-17 NOTE — Anesthesia Postprocedure Evaluation (Signed)
Anesthesia Post Note  Patient: Ulice Whitaker  Procedure(s) Performed: CATARACT EXTRACTION PHACO AND INTRAOCULAR LENS PLACEMENT (IOC) (Right: Eye) Goniotomy (Right: Eye)  Patient location during evaluation: Phase II Anesthesia Type: MAC Level of consciousness: awake Pain management: pain level controlled Vital Signs Assessment: post-procedure vital signs reviewed and stable Respiratory status: spontaneous breathing and respiratory function stable Cardiovascular status: blood pressure returned to baseline and stable Postop Assessment: no headache and no apparent nausea or vomiting Anesthetic complications: no Comments: Late entry   No notable events documented.   Last Vitals:  Vitals:   03/17/22 1234 03/17/22 1342  BP: (!) 172/98 (!) 165/81  Pulse: 64 (!) 57  Resp: 12 14  Temp: 36.8 C 36.7 C  SpO2: 99% 98%    Last Pain:  Vitals:   03/17/22 1342  TempSrc: Oral  PainSc: 0-No pain                 Louann Sjogren

## 2022-03-17 NOTE — Anesthesia Procedure Notes (Signed)
Procedure Name: MAC Date/Time: 03/17/2022 1:14 PM  Performed by: Lieutenant Diego, CRNAPre-anesthesia Checklist: Patient identified, Emergency Drugs available, Suction available, Patient being monitored and Timeout performed Patient Re-evaluated:Patient Re-evaluated prior to induction Oxygen Delivery Method: Nasal cannula Preoxygenation: Pre-oxygenation with 100% oxygen Induction Type: IV induction

## 2022-03-17 NOTE — Interval H&P Note (Signed)
History and Physical Interval Note:  03/17/2022 1:04 PM  William Joseph  has presented today for surgery, with the diagnosis of combined forms age related cataract; right glaucoma; right.  The various methods of treatment have been discussed with the patient and family. After consideration of risks, benefits and other options for treatment, the patient has consented to  Procedure(s) with comments: CATARACT EXTRACTION PHACO AND INTRAOCULAR LENS PLACEMENT (IOC) (Right) - CDE Goniotomy (Right) as a surgical intervention.  The patient's history has been reviewed, patient examined, no change in status, stable for surgery.  I have reviewed the patient's chart and labs.  Questions were answered to the patient's satisfaction.     Baruch Goldmann

## 2022-03-19 ENCOUNTER — Encounter (HOSPITAL_COMMUNITY): Payer: Self-pay | Admitting: Ophthalmology

## 2022-03-25 NOTE — H&P (Signed)
Surgical History & Physical  Patient Name: William Joseph      DOB: 04-Dec-1945  Surgery: Cataract extraction with intraocular lens implant phacoemulsification & Goniotomy Treatment (iAccess); Left Eye  Surgeon: Baruch Goldmann MD Surgery Date:  04-04-22 Pre-Op Date:  03-24-22  HPI: A 75 Yr. old male patient 1. The patient is returning after cataract surgery. The right eye is affected. Status post cataract surgery, which began 1 weeks ago: Since the last visit, the affected area is doing well. The patient's vision is improved. Patient is following medication instructions. 2. 2. The patient is returning for a cataract follow-up of the left eye. Since the last visit, the affected area is tolerating. The patient's vision is blurry. The complaint is associated with difficulty reading traffic signs/street signs, difficulty driving at night due to halos/glare, and difficulty seeing captions on tv. This is negatively affecting the patient's quality of life and the patient is unable to function adequately in life with the current level of vision. HPI was performed by Baruch Goldmann .  Medical History: Glaucoma Ptosis OU, Presbyopia OU Glaucoma No family hx of macular degeneration  Review of Systems Allergic/Immunologic Seasonal Allergies All recorded systems are negative except as noted above.  Social   Never smoked   Medication Simbrinza, Prednisolone-Moxifloxacin-Bromfenac,  Claritin,   Sx/Procedures SLT Trabeculoplasty, SLT Trabeculoplasty, SLT, SLT, Phaco c IOL OD with iAccess,   Drug Allergies   NKDA  History & Physical: Heent: Cataract & Glaucoma left eye NECK: supple without bruits LUNGS: lungs clear to auscultation CV: regular rate and rhythm Abdomen: soft and non-tender Impression & Plan: Assessment: 1.  CATARACT EXTRACTION STATUS; Right Eye (Z98.41) 2.  COMBINED FORMS AGE RELATED CATARACT; Left Eye (H25.812) 3.  PRIMARY OPEN ANGLE GLAUCOMA; Both Eyes Mild  (H40.1131)  Plan: 1.  1 week after cataract surgery. Doing well with improved vision and normal eye pressure. Call with any problems or concerns. Continue Pred-Moxi-Brom 2x/day for 3 more weeks.  2.  Cataract accounts for the patient's decreased vision. This visual impairment is not correctable with a tolerable change in glasses or contact lenses. Cataract surgery with an implantation of a new lens should significantly improve the visual and functional status of the patient. Discussed all risks, benefits, alternatives, and potential complications. Discussed the procedures and recovery. Patient desires to have surgery. A-scan ordered and performed today for intra-ocular lens calculations. The surgery will be performed in order to improve vision for driving, reading, and for eye examinations. Recommend phacoemulsification with intra-ocular lens. Recommend Dextenza for post-operative pain and inflammation. Left eye Surgery required to balance vision. Dilates poorly - shugarcaine by protocol. Malyugin Ring. Omidira. Recommend iAccess goniotomy to control pressure in setting of glaucoma.  3.  Stable. 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  Findings, prognosis and treatment options reviewed.  OCT rNFL stable 11/23 HVF 24-2 11/23 Glaucoma is a slow progressive disease often causing painless vision loss. Compliance with treatment discussed, importance of regular follow ups and regular testing understood. Detailed discussion about glaucoma today including importance of maintaining good follow up and following treatment plan, and the possibility of irreversible blindness as part of this disease process. Patient has had trouble with allergies and intolerances to drops. Recommend goniotomy (iAccess) OU to help lower eye pressure.

## 2022-04-02 ENCOUNTER — Encounter (HOSPITAL_COMMUNITY): Payer: Self-pay

## 2022-04-02 ENCOUNTER — Encounter (HOSPITAL_COMMUNITY)
Admission: RE | Admit: 2022-04-02 | Discharge: 2022-04-02 | Disposition: A | Discharge status: Home / Self Care | Payer: Federal, State, Local not specified - PPO | Source: Ambulatory Visit | Attending: Ophthalmology | Admitting: Ophthalmology

## 2022-04-04 ENCOUNTER — Ambulatory Visit (HOSPITAL_COMMUNITY): Payer: Federal, State, Local not specified - PPO | Admitting: Anesthesiology

## 2022-04-04 ENCOUNTER — Encounter (HOSPITAL_COMMUNITY): Payer: Self-pay | Admitting: Ophthalmology

## 2022-04-04 ENCOUNTER — Ambulatory Visit (HOSPITAL_COMMUNITY)
Admission: RE | Admit: 2022-04-04 | Discharge: 2022-04-04 | Disposition: A | Discharge status: Home / Self Care | Payer: Federal, State, Local not specified - PPO | Attending: Ophthalmology | Admitting: Ophthalmology

## 2022-04-04 ENCOUNTER — Encounter (HOSPITAL_COMMUNITY)
Admission: RE | Disposition: A | Discharge status: Home / Self Care | Payer: Self-pay | Source: Home / Self Care | Attending: Ophthalmology

## 2022-04-04 DIAGNOSIS — F17200 Nicotine dependence, unspecified, uncomplicated: Secondary | ICD-10-CM | POA: Diagnosis not present

## 2022-04-04 DIAGNOSIS — I10 Essential (primary) hypertension: Secondary | ICD-10-CM | POA: Insufficient documentation

## 2022-04-04 DIAGNOSIS — Z85850 Personal history of malignant neoplasm of thyroid: Secondary | ICD-10-CM | POA: Diagnosis not present

## 2022-04-04 DIAGNOSIS — H25812 Combined forms of age-related cataract, left eye: Secondary | ICD-10-CM | POA: Insufficient documentation

## 2022-04-04 DIAGNOSIS — G4730 Sleep apnea, unspecified: Secondary | ICD-10-CM | POA: Insufficient documentation

## 2022-04-04 DIAGNOSIS — H2181 Floppy iris syndrome: Secondary | ICD-10-CM | POA: Diagnosis not present

## 2022-04-04 DIAGNOSIS — H401121 Primary open-angle glaucoma, left eye, mild stage: Secondary | ICD-10-CM | POA: Insufficient documentation

## 2022-04-04 DIAGNOSIS — H401120 Primary open-angle glaucoma, left eye, stage unspecified: Secondary | ICD-10-CM

## 2022-04-04 DIAGNOSIS — Z8572 Personal history of non-Hodgkin lymphomas: Secondary | ICD-10-CM | POA: Insufficient documentation

## 2022-04-04 HISTORY — PX: CATARACT EXTRACTION W/PHACO: SHX586

## 2022-04-04 SURGERY — PHACOEMULSIFICATION, CATARACT, WITH IOL INSERTION
Anesthesia: Monitor Anesthesia Care | Site: Eye | Laterality: Left

## 2022-04-04 MED ORDER — SODIUM HYALURONATE 10 MG/ML IO SOLUTION
PREFILLED_SYRINGE | INTRAOCULAR | Status: DC | PRN
  Administered 2022-04-04: .85 mL via INTRAOCULAR

## 2022-04-04 MED ORDER — POVIDONE-IODINE 5 % OP SOLN
OPHTHALMIC | Status: DC | PRN
  Administered 2022-04-04: 1 via OPHTHALMIC

## 2022-04-04 MED ORDER — MOXIFLOXACIN HCL 0.5 % OP SOLN
OPHTHALMIC | Status: DC | PRN
  Administered 2022-04-04: .2 mL via OPHTHALMIC

## 2022-04-04 MED ORDER — MOXIFLOXACIN HCL 5 MG/ML IO SOLN
INTRAOCULAR | Status: AC
  Filled 2022-04-04: qty 1

## 2022-04-04 MED ORDER — MIDAZOLAM HCL 2 MG/2ML IJ SOLN
INTRAMUSCULAR | Status: AC
  Filled 2022-04-04: qty 2

## 2022-04-04 MED ORDER — PHENYLEPHRINE-KETOROLAC 1-0.3 % IO SOLN
INTRAOCULAR | Status: AC
  Filled 2022-04-04: qty 4

## 2022-04-04 MED ORDER — TROPICAMIDE 1 % OP SOLN
1.0000 [drp] | OPHTHALMIC | Status: AC | PRN
  Administered 2022-04-04 (×3): 1 [drp] via OPHTHALMIC

## 2022-04-04 MED ORDER — PHENYLEPHRINE-KETOROLAC 1-0.3 % IO SOLN
INTRAOCULAR | Status: DC | PRN
  Administered 2022-04-04: 500 mL via OPHTHALMIC

## 2022-04-04 MED ORDER — BSS IO SOLN
INTRAOCULAR | Status: DC | PRN
  Administered 2022-04-04: 15 mL via INTRAOCULAR

## 2022-04-04 MED ORDER — PHENYLEPHRINE HCL 2.5 % OP SOLN
1.0000 [drp] | OPHTHALMIC | Status: AC | PRN
  Administered 2022-04-04 (×3): 1 [drp] via OPHTHALMIC

## 2022-04-04 MED ORDER — SODIUM HYALURONATE 23MG/ML IO SOSY
PREFILLED_SYRINGE | INTRAOCULAR | Status: DC | PRN
  Administered 2022-04-04: .6 mL via INTRAOCULAR

## 2022-04-04 MED ORDER — LIDOCAINE HCL 3.5 % OP GEL
1.0000 | Freq: Once | OPHTHALMIC | Status: AC
  Administered 2022-04-04: 1 via OPHTHALMIC

## 2022-04-04 MED ORDER — TETRACAINE 0.5 % OP SOLN OPTIME - NO CHARGE
OPHTHALMIC | Status: DC | PRN
  Administered 2022-04-04: 3 [drp] via OPHTHALMIC

## 2022-04-04 MED ORDER — EPINEPHRINE PF 1 MG/ML IJ SOLN
INTRAMUSCULAR | Status: AC
  Filled 2022-04-04: qty 2

## 2022-04-04 MED ORDER — LIDOCAINE HCL (PF) 1 % IJ SOLN
INTRAOCULAR | Status: DC | PRN
  Administered 2022-04-04 (×2): 1 mL via OPHTHALMIC

## 2022-04-04 MED ORDER — MIDAZOLAM HCL 2 MG/2ML IJ SOLN
INTRAMUSCULAR | Status: DC | PRN
  Administered 2022-04-04: 2 mg via INTRAVENOUS

## 2022-04-04 MED ORDER — STERILE WATER FOR IRRIGATION IR SOLN
Status: DC | PRN
  Administered 2022-04-04: 250 mL

## 2022-04-04 MED ORDER — TETRACAINE HCL 0.5 % OP SOLN
1.0000 [drp] | OPHTHALMIC | Status: AC | PRN
  Administered 2022-04-04 (×3): 1 [drp] via OPHTHALMIC

## 2022-04-04 SURGICAL SUPPLY — 17 items
CATARACT SUITE SIGHTPATH (MISCELLANEOUS) ×1 IMPLANT
CLIP IPRISM (KITS) IMPLANT
CLOTH BEACON ORANGE TIMEOUT ST (SAFETY) ×1 IMPLANT
EYE SHIELD UNIVERSAL CLEAR (GAUZE/BANDAGES/DRESSINGS) IMPLANT
FEE CATARACT SUITE SIGHTPATH (MISCELLANEOUS) ×1 IMPLANT
GLOVE BIOGEL PI IND STRL 7.0 (GLOVE) ×2 IMPLANT
LENS IOL RAYNER 18.5 (Intraocular Lens) ×1 IMPLANT
LENS IOL RAYONE EMV 18.5 (Intraocular Lens) IMPLANT
NDL HYPO 18GX1.5 BLUNT FILL (NEEDLE) ×2 IMPLANT
NEEDLE HYPO 18GX1.5 BLUNT FILL (NEEDLE) ×2 IMPLANT
PAD ARMBOARD 7.5X6 YLW CONV (MISCELLANEOUS) ×1 IMPLANT
RING MALYGIN 7.0 (MISCELLANEOUS) IMPLANT
SYR TB 1ML LL NO SAFETY (SYRINGE) ×1 IMPLANT
TAPE SURG TRANSPORE 1 IN (GAUZE/BANDAGES/DRESSINGS) IMPLANT
TAPE SURGICAL TRANSPORE 1 IN (GAUZE/BANDAGES/DRESSINGS) ×1
TREPHINE GLAUKO IACCESS TRBCLR (OPHTHALMIC) IMPLANT
WATER STERILE IRR 250ML POUR (IV SOLUTION) ×1 IMPLANT

## 2022-04-04 NOTE — Anesthesia Postprocedure Evaluation (Signed)
Anesthesia Post Note  Patient: [name]  Procedure(s) Performed: CATARACT EXTRACTION PHACO AND INTRAOCULAR LENS PLACEMENT (IOC) (Left: Eye) Goniotomy (Left: Eye)  Patient location during evaluation: Phase II Anesthesia Type: MAC Level of consciousness: awake and alert and oriented Pain management: pain level controlled Vital Signs Assessment: post-procedure vital signs reviewed and stable Respiratory status: spontaneous breathing, nonlabored ventilation and respiratory function stable Cardiovascular status: blood pressure returned to baseline and stable Postop Assessment: no apparent nausea or vomiting Anesthetic complications: no  No notable events documented.   Last Vitals:  Vitals:   04/04/22 1249 04/04/22 1356  BP: (!) 157/99 (!) 177/98  Pulse: 79 (!) 55  Resp: 12 14  Temp: 36.8 C 36.7 C  SpO2: 99% 100%    Last Pain:  Vitals:   04/04/22 1356  TempSrc: Oral  PainSc: 0-No pain                 Caterin Tabares C Angle Dirusso

## 2022-04-04 NOTE — Transfer of Care (Signed)
Immediate Anesthesia Transfer of Care Note  Patient: William Joseph  Procedure(s) Performed: CATARACT EXTRACTION PHACO AND INTRAOCULAR LENS PLACEMENT (IOC) (Left: Eye) Goniotomy (Left: Eye)  Patient Location: Short Stay  Anesthesia Type:MAC  Level of Consciousness: awake, alert , and oriented  Airway & Oxygen Therapy: Patient Spontanous Breathing  Post-op Assessment: Report given to RN and Post -op Vital signs reviewed and stable  Post vital signs: Reviewed and stable  Last Vitals:  Vitals Value Taken Time  BP 177/98 04/04/22 1356  Temp 36.7 C 04/04/22 1356  Pulse 55 04/04/22 1356  Resp 14 04/04/22 1356  SpO2 100 % 04/04/22 1356    Last Pain:  Vitals:   04/04/22 1356  TempSrc: Oral  PainSc: 0-No pain         Complications: No notable events documented.

## 2022-04-04 NOTE — Interval H&P Note (Signed)
History and Physical Interval Note:  04/04/2022 1:20 PM  William Joseph  has presented today for surgery, with the diagnosis of combined forms age related cataract; left primary open angle glaucoma ; left.  The various methods of treatment have been discussed with the patient and family. After consideration of risks, benefits and other options for treatment, the patient has consented to  Procedure(s) with comments: CATARACT EXTRACTION PHACO AND INTRAOCULAR LENS PLACEMENT (IOC) (Left) - CDE Goniotomy (Left) as a surgical intervention.  The patient's history has been reviewed, patient examined, no change in status, stable for surgery.  I have reviewed the patient's chart and labs.  Questions were answered to the patient's satisfaction.     Baruch Goldmann

## 2022-04-04 NOTE — Discharge Instructions (Signed)
Please discharge patient when stable, will follow up today with Dr. Mishell Donalson at the [facility] [place] office immediately following discharge.  Leave shield in place until visit.  All paperwork with discharge instructions will be given at the office.  [facility] Address:  [address]  [place], [place] [address]  

## 2022-04-04 NOTE — Anesthesia Preprocedure Evaluation (Signed)
Anesthesia Evaluation  Patient identified by MRN, date of birth, ID band Patient awake    Reviewed: Allergy & Precautions, H&P , NPO status , Patient's Chart, lab work & pertinent test results  Airway Mallampati: III  TM Distance: >3 FB Neck ROM: Full    Dental  (+) Implants, Dental Advisory Given   Pulmonary shortness of breath and with exertion, sleep apnea , Current Smoker   Pulmonary exam normal breath sounds clear to auscultation       Cardiovascular hypertension, Pt. on medications Normal cardiovascular exam Rhythm:Regular Rate:Normal     Neuro/Psych  Headaches PSYCHIATRIC DISORDERS Anxiety Depression     Neuromuscular disease    GI/Hepatic negative GI ROS, Neg liver ROS,,,  Endo/Other  negative endocrine ROS    Renal/GU negative Renal ROS  negative genitourinary   Musculoskeletal negative musculoskeletal ROS (+)    Abdominal   Peds negative pediatric ROS (+)  Hematology  (+) Blood dyscrasia (Non Hodgkin's lymphoma), anemia   Anesthesia Other Findings Thyroid cancer  Reproductive/Obstetrics negative OB ROS                              Anesthesia Physical Anesthesia Plan  ASA: 3  Anesthesia Plan: MAC   Post-op Pain Management: Minimal or no pain anticipated   Induction:   PONV Risk Score and Plan:   Airway Management Planned: Nasal Cannula and Natural Airway  Additional Equipment:   Intra-op Plan:   Post-operative Plan:   Informed Consent: I have reviewed the patients History and Physical, chart, labs and discussed the procedure including the risks, benefits and alternatives for the proposed anesthesia with the patient or authorized representative who has indicated his/her understanding and acceptance.       Plan Discussed with: CRNA and Surgeon  Anesthesia Plan Comments:          Anesthesia Quick Evaluation

## 2022-04-04 NOTE — Op Note (Signed)
Date of procedure: 04/04/22  Pre-operative diagnosis:  1) Visually significant age-related combined cataract, Left Eye (H25.812) 2) Primary Open Angle Glaucoma, mild Stage, Left Eye 3) Poor dilation, left eye  Post-operative diagnosis: 1) Visually significant age-related cataract, Left Eye 2) Primary Open Angle Glaucoma, mild Stage, Left Eye 3) Intra-operative floppy iris syndrome, left eye  Procedure:  1) Complex Removal of cataract via phacoemulsification and insertion of intra-ocular lens Rayner RAO200E +18.5D into the capsular bag of the Left Eye 2) Goniotomy treatment of nasal trabecular meshwork with iAccess, Left Eye  Attending surgeon: Gerda Diss. Hussain Maimone, MD, MA  Anesthesia: MAC, Topical Akten  Complications: None  Estimated Blood Loss: <72m (minimal)  Specimens: None  Implants: As above  Indications:  Visually significant age-related cataract, Left Eye; Glaucoma, Left Eye  Procedure:  The patient was seen and identified in the pre-operative area. The operative eye was identified and dilated.  The operative eye was marked.  Topical anesthesia was administered to the operative eye.     The patient was then to the operative suite and placed in the supine position.  A timeout was performed confirming the patient, procedure to be performed, and all other relevant information.   The patient's face was prepped and draped in the usual fashion for intra-ocular surgery.  A lid speculum was placed into the operative eye and the surgical microscope moved into place and focused. An inferotemporal paracentesis was created using a 20 gauge paracentesis blade.  Shugarcaine was injected into the anterior chamber.  Viscoelastic was injected into the anterior chamber.  A temporal clear-corneal main wound incision was created using a 2.437mmicrokeratome. A 7.76m18malyugin ring was placed. A continuous curvilinear capsulorrhexis was initiated using an irrigating cystitome and completed using  capsulorrhexis forceps.  Hydrodissection and hydrodeliniation were performed.  Viscoelastic was injected into the anterior chamber.  A phacoemulsification handpiece and a chopper as a second instrument were used to remove the nucleus and epinucleus. The irrigation/aspiration handpiece was used to remove any remaining cortical material.   The capsular bag was reinflated with viscoelastic, checked, and found to be intact.  The intraocular lens was inserted into the capsular bag and dialed into place using a Kuglen hook.    The patient's head was repositioned.  The iAccess was used to extensively treat the nasal trabecular meshwork for 90 degrees for a total of 5 goniotomies under gonioscopy.  The patient's head was returned to neutral. The Malyugin ring was removed. The irrigation/aspiration handpiece was used to remove any remaining viscoelastic.  The clear corneal wound and paracentesis wounds were then hydrated and checked with Weck-Cels to be watertight.  The lid-speculum was removed.  The drape was removed.  The patient's face was cleaned with a wet and dry 4x4.  Moxifloxacin drops were instilled on the eye. A clear shield was taped over the eye. The patient was taken to the post-operative care unit in good condition, having tolerated the procedure well.  Post-Op Instructions: [facility] and will receive all other orders and instructions.

## 2022-04-11 ENCOUNTER — Encounter (HOSPITAL_COMMUNITY): Payer: Self-pay | Admitting: Ophthalmology

## 2022-06-05 ENCOUNTER — Encounter: Payer: Self-pay | Admitting: Radiology

## 2022-06-16 ENCOUNTER — Other Ambulatory Visit: Payer: Federal, State, Local not specified - PPO

## 2022-06-16 ENCOUNTER — Ambulatory Visit
Admission: RE | Admit: 2022-06-16 | Discharge: 2022-06-16 | Disposition: A | Discharge status: Home / Self Care | Payer: Federal, State, Local not specified - PPO | Source: Ambulatory Visit | Attending: Otolaryngology | Admitting: Otolaryngology

## 2022-06-16 DIAGNOSIS — C73 Malignant neoplasm of thyroid gland: Secondary | ICD-10-CM

## 2022-06-23 ENCOUNTER — Other Ambulatory Visit: Payer: Self-pay | Admitting: Urology

## 2022-06-23 ENCOUNTER — Telehealth: Payer: Self-pay

## 2022-06-23 ENCOUNTER — Other Ambulatory Visit: Payer: Self-pay

## 2022-06-23 DIAGNOSIS — N5201 Erectile dysfunction due to arterial insufficiency: Secondary | ICD-10-CM

## 2022-06-23 MED ORDER — TADALAFIL 10 MG PO TABS: 10.0000 mg | ORAL_TABLET | Freq: Every day | ORAL | 3 refills | Status: DC | PRN

## 2022-06-23 NOTE — Telephone Encounter (Signed)
Patient called in today for refill for Cialis. Verbal from Dr. Junious Silk to refill Cialis. Patient voiced understanding

## 2022-08-04 ENCOUNTER — Other Ambulatory Visit: Payer: Federal, State, Local not specified - PPO

## 2022-08-04 DIAGNOSIS — C61 Malignant neoplasm of prostate: Secondary | ICD-10-CM

## 2022-08-05 LAB — TESTOSTERONE: Testosterone: 277 ng/dL (ref 264–916)

## 2022-08-05 LAB — PSA: Prostate Specific Ag, Serum: <0.1 ng/mL (ref 0.0–4.0)

## 2022-08-11 ENCOUNTER — Ambulatory Visit: Payer: Federal, State, Local not specified - PPO | Admitting: Urology

## 2022-08-18 ENCOUNTER — Ambulatory Visit: Payer: Federal, State, Local not specified - PPO | Admitting: Urology

## 2023-06-08 ENCOUNTER — Other Ambulatory Visit (HOSPITAL_COMMUNITY): Payer: Self-pay | Admitting: Nurse Practitioner

## 2023-06-08 DIAGNOSIS — R918 Other nonspecific abnormal finding of lung field: Secondary | ICD-10-CM

## 2023-12-17 ENCOUNTER — Other Ambulatory Visit (HOSPITAL_COMMUNITY): Payer: Self-pay | Admitting: Nurse Practitioner

## 2023-12-17 DIAGNOSIS — R918 Other nonspecific abnormal finding of lung field: Secondary | ICD-10-CM

## 2023-12-22 ENCOUNTER — Ambulatory Visit: Admitting: Podiatry

## 2023-12-22 VITALS — Ht 74.0 in | Wt 225.0 lb

## 2023-12-22 DIAGNOSIS — G609 Hereditary and idiopathic neuropathy, unspecified: Secondary | ICD-10-CM

## 2023-12-23 ENCOUNTER — Encounter: Payer: Self-pay | Admitting: Podiatry

## 2023-12-23 ENCOUNTER — Ambulatory Visit (HOSPITAL_COMMUNITY)
Admission: RE | Admit: 2023-12-23 | Discharge: 2023-12-23 | Disposition: A | Discharge status: Home / Self Care | Source: Ambulatory Visit | Attending: Nurse Practitioner | Admitting: Nurse Practitioner

## 2023-12-23 DIAGNOSIS — R918 Other nonspecific abnormal finding of lung field: Secondary | ICD-10-CM | POA: Insufficient documentation

## 2023-12-23 NOTE — Progress Notes (Signed)
  Subjective:  Patient ID: William Joseph, male    DOB: Apr 11, 1945,  MRN: [id number]  Chief Complaint  Patient presents with   Peripheral Neuropathy    RM 2 NP/ Polyneuropathy.  Pt has a burning sensation on the ball of the feet with some sweating.    Discussed the use of AI scribe software for clinical note transcription with the patient, who gave verbal consent to proceed.  History of Present Illness William Joseph is a 78 year old male who presents with tingling and altered sensation in his feet.  He experiences tingling on the top of his feet and a sensation of hardness on the bottom, with a feeling as if his feet are damp, although they are not. These symptoms were particularly severe on Monday but improved after he removed his toenails on both feet. The patient reports that the tingling stopped after he cut his toenails during a recent episode.  He has a history of cancer treated with chemotherapy and radiation in January 2005. After chemotherapy, he experienced similar problems with his feet, which resolved after a couple of years but have recently returned.  He denies having diabetes. He has not had his vitamin B12 levels checked recently, although he has been taking a B complex supplement. He recently had a physical with lab work but is unsure if B12 and folate levels were included.  His fingernails and toenails grow very quickly since chemotherapy, sometimes within eight days, and they can become brittle and thick.      Objective:    Physical Exam VASCULAR: DP and PT pulse palpable. Foot is warm and well-perfused. Capillary fill time is brisk. Pulses palpable, good circulation. DERMATOLOGIC: Normal skin turgor, texture, and temperature. No open lesions, rashes, or ulcerations. NEUROLOGIC: Reduced peripheral light touch and vibratory sensation, protective sensation intact. No paresthesias on examination. ORTHOPEDIC: Smooth pain-free range of motion of all examined  joints. No ecchymosis or bruising. No gross deformity. No pain to palpation.   No images are attached to the encounter.    Results     Assessment:   1. Idiopathic polyneuropathy      Plan:  Patient was evaluated and treated and all questions answered.  Assessment and Plan Assessment & Plan Peripheral neuropathy Altered sensation in both feet, characterized by tingling and numbness. Likely idiopathic neuropathy, possibly age-related. Chemotherapy-induced neuropathy considered less likely due to the time elapsed since treatment. Good circulation and intact protective sensation. - Check B12 and folate levels with primary care provider. - Consider over-the-counter topical numbing cream like Nervive for pain management. - Avoid starting medications like gabapentin  or Lyrica unless symptoms become more severe. Discussed side effects such as dizziness and drowsiness. - Monitor symptoms and report to primary care if he worsens or becomes more frequent.  Bilateral ingrown toenails Pain and tingling in both feet, relieved by toenail trimming. Ingrown toenails likely contributing to discomfort, exacerbated by neuropathy. - Regularly trim toenails to prevent ingrowth. - Consider professional pedicures to maintain nail health.      No follow-ups on file.

## 2023-12-31 ENCOUNTER — Other Ambulatory Visit (HOSPITAL_COMMUNITY): Payer: Self-pay | Admitting: Nurse Practitioner

## 2023-12-31 DIAGNOSIS — R918 Other nonspecific abnormal finding of lung field: Secondary | ICD-10-CM

## 2024-02-03 ENCOUNTER — Ambulatory Visit (INDEPENDENT_AMBULATORY_CARE_PROVIDER_SITE_OTHER): Admitting: Internal Medicine

## 2024-02-03 ENCOUNTER — Telehealth (INDEPENDENT_AMBULATORY_CARE_PROVIDER_SITE_OTHER): Payer: Self-pay

## 2024-02-03 ENCOUNTER — Encounter: Payer: Self-pay | Admitting: Internal Medicine

## 2024-02-03 VITALS — BP 185/99 | HR 61 | Temp 97.9°F | Ht 74.0 in | Wt 235.1 lb

## 2024-02-03 DIAGNOSIS — R195 Other fecal abnormalities: Secondary | ICD-10-CM | POA: Diagnosis not present

## 2024-02-03 DIAGNOSIS — I10 Essential (primary) hypertension: Secondary | ICD-10-CM | POA: Diagnosis not present

## 2024-02-03 DIAGNOSIS — K5904 Chronic idiopathic constipation: Secondary | ICD-10-CM | POA: Diagnosis not present

## 2024-02-03 DIAGNOSIS — K219 Gastro-esophageal reflux disease without esophagitis: Secondary | ICD-10-CM | POA: Diagnosis not present

## 2024-02-03 MED ORDER — SUFLAVE 178.7 G PO SOLR: 1.0000 | ORAL | 0 refills | Status: DC

## 2024-02-03 NOTE — Patient Instructions (Signed)
 We will add you for colonoscopy given your positive stool test.  It was very nice meeting you both today.  Dr. Cindie

## 2024-02-03 NOTE — Progress Notes (Signed)
 Primary Care Physician:  [name], FNP Primary Gastroenterologist:  Dr. Cindie  Chief Complaint  Patient presents with   Constipation    Sometimes goes a week before having a bm. States that it has been this way all his life.    HPI:   William Joseph is a 78 y.o. male who presents to clinic today by referral from his PCP Browning Joeann for evaluation.  Recently underwent Hemoccult which was positive.  Denies any gross melena or hematochezia.  No abdominal pain or unintentional weight loss.  No family history of colorectal malignancy.  Last colonoscopy 06/29/2015 1 polyp removed.  Benign on pathology.  Does note issues with chronic constipation for over 40 years.  Will have a bowel movement 1-2 times per week.  Denies any associated straining.  No rectal pain or discomfort.  No associated abdominal bloating or abdominal pain.  Takes OTC meds as needed.  Mild intermittent reflux.  Primarily at night.  Primarily occurs when he eats right before bedtime.  No dysphagia odynophagia.  No epigastric or chest pain.  Past Medical History:  Diagnosis Date   Anxiety    Essential hypertension, benign    Headache    History of cardiac catheterization    Normal coronaries March 2011   History of non-Hodgkin's lymphoma 06/26/2009   Qualifier: Diagnosis of  By: Mellie, CNA, Sandy     Non Hodgkin's lymphoma (HCC)    Nonerosive nonspecific gastritis    EGD - Dr. Golda   Obsessive-compulsive disorder    Palpitations    Pre-diabetes    Thyroid  mass     Past Surgical History:  Procedure Laterality Date   CATARACT EXTRACTION W/PHACO Right 03/17/2022   Procedure: CATARACT EXTRACTION PHACO AND INTRAOCULAR LENS PLACEMENT (IOC);  Surgeon: Harrie Agent, MD;  Location: [facility];  Service: Ophthalmology;  Laterality: Right;  CDE 7.71   CATARACT EXTRACTION W/PHACO Left 04/04/2022   Procedure: CATARACT EXTRACTION PHACO AND INTRAOCULAR LENS PLACEMENT (IOC);  Surgeon: Harrie Agent, MD;   Location: [facility];  Service: Ophthalmology;  Laterality: Left;  CDE 8.27   COLONOSCOPY N/A 06/29/2015   Procedure: COLONOSCOPY;  Surgeon: Lamar CHRISTELLA Hollingshead, MD;  Location: [facility];  Service: Endoscopy;  Laterality: N/A;  9:30 Am - moved to 9:15 - office to notify   ESOPHAGOGASTRODUODENOSCOPY ENDOSCOPY N/A    EXPLORATORY LAPAROTOMY     GLAUCOMA SURGERY Bilateral 11/2013   didnt work   GOLD SEED IMPLANT N/A 05/06/2021   Procedure: GOLD SEED IMPLANT;  Surgeon: Sherrilee Belvie CROME, MD;  Location: [facility];  Service: Urology;  Laterality: N/A;   LYMPH NODE BIOPSY     POLYPECTOMY  06/29/2015   Procedure: POLYPECTOMY;  Surgeon: Lamar CHRISTELLA Hollingshead, MD;  Location: [facility];  Service: Endoscopy;;  descending colon polyp removed via cold snare   Port-a-cath placement     SPACE OAR INSTILLATION N/A 05/06/2021   Procedure: SPACE OAR INSTILLATION;  Surgeon: Sherrilee Belvie CROME, MD;  Location: [facility];  Service: Urology;  Laterality: N/A;   THYROID  LOBECTOMY Right 12/04/2021   Procedure: THYROID  LOBECTOMY;  Surgeon: Carlie Clark, MD;  Location: [facility];  Service: ENT;  Laterality: Right;   Tooth implant      Current Outpatient Medications  Medication Sig Dispense Refill   Aspirin-Salicylamide-Caffeine (BC HEADACHE POWDER PO) Take 1 packet by mouth daily as needed (headache).     B Complex Vitamins (VITAMIN B COMPLEX PO) Take 1 capsule by mouth in the morning.  fluticasone  (FLONASE ) 50 MCG/ACT nasal spray Place 1 spray into both nostrils daily as needed for allergies or rhinitis.     latanoprost (XALATAN) 0.005 % ophthalmic solution Place 1 drop into both eyes at bedtime.     losartan  (COZAAR ) 25 MG tablet TAKE 1 TABLET(25 MG) BY MOUTH DAILY 30 tablet 2   Multiple Vitamin (MULTIVITAMIN WITH MINERALS) TABS tablet Take 1 tablet by mouth in the morning.     rosuvastatin  (CRESTOR ) 10 MG tablet Take 10 mg by mouth at bedtime.     tadalafil  (CIALIS ) 10 MG tablet Take 1 tablet (10 mg total) by mouth daily as  needed for erectile dysfunction. 10 tablet 3   tamsulosin  (FLOMAX ) 0.4 MG CAPS capsule TAKE 1 CAPSULE BY MOUTH EVERY DAY (Patient taking differently: 0.4 mg in the morning and at bedtime.) 30 capsule 3   No current facility-administered medications for this visit.    Allergies as of 02/03/2024 - Review Complete 02/03/2024  Allergen Reaction Noted   Tomato Itching 04/30/2021    Family History  Problem Relation Age of Onset   Diabetes Father    Coronary artery disease Mother    Hypertension Brother    Mental illness Maternal Uncle    Dementia Maternal Grandfather    Seizures Cousin    Drug abuse Other    Seizures Maternal Uncle    ADD / ADHD Neg Hx    Alcohol abuse Neg Hx    Anxiety disorder Neg Hx    Bipolar disorder Neg Hx    Depression Neg Hx    OCD Neg Hx    Paranoid behavior Neg Hx    Schizophrenia Neg Hx    Sexual abuse Neg Hx    Physical abuse Neg Hx     Social History   Socioeconomic History   Marital status: Married    Spouse name: Not on file   Number of children: Not on file   Years of education: Not on file   Highest education level: Not on file  Occupational History   Not on file  Tobacco Use   Smoking status: Light Smoker    Current packs/day: 0.00    Average packs/day: 0.3 packs/day for 20.0 years (5.0 ttl pk-yrs)    Types: Cigars, Cigarettes    Start date: 03/14/1978    Last attempt to quit: 03/14/1998    Years since quitting: 25.9   Smokeless tobacco: Never   Tobacco comments:    Puffs on cigars every so often.   Vaping Use   Vaping status: Never Used  Substance and Sexual Activity   Alcohol use: Yes    Alcohol/week: 0.0 standard drinks of alcohol    Comment: glass of wine occasionally   Drug use: No   Sexual activity: Yes  Other Topics Concern   Not on file  Social History Narrative   Not on file   Social Drivers of Health   Financial Resource Strain: Not on file  Food Insecurity: Not on file  Transportation Needs: Not on file   Physical Activity: Not on file  Stress: Not on file  Social Connections: Not on file  Intimate Partner Violence: Not on file    Subjective: Review of Systems  Constitutional:  Negative for chills and fever.  HENT:  Negative for congestion and hearing loss.   Eyes:  Negative for blurred vision and double vision.  Respiratory:  Negative for cough and shortness of breath.   Cardiovascular:  Negative for chest pain and palpitations.  Gastrointestinal:  Negative for abdominal pain, blood in stool, constipation, diarrhea, heartburn, melena and vomiting.  Genitourinary:  Negative for dysuria and urgency.  Musculoskeletal:  Negative for joint pain and myalgias.  Skin:  Negative for itching and rash.  Neurological:  Negative for dizziness and headaches.  Psychiatric/Behavioral:  Negative for depression. The patient is not nervous/anxious.        Objective: BP (!) 185/99 (BP Location: Right Arm, Patient Position: Sitting, Cuff Size: Normal) Comment (BP Location): Taken on lower forearm  Pulse 61   Temp 97.9 F (36.6 C) (Oral)   Ht 6' 2 (1.88 m)   Wt 235 lb 1.6 oz (106.6 kg)   SpO2 98%   BMI 30.19 kg/m  Physical Exam Constitutional:      Appearance: Normal appearance.  HENT:     Head: Normocephalic and atraumatic.  Eyes:     Extraocular Movements: Extraocular movements intact.     Conjunctiva/sclera: Conjunctivae normal.  Cardiovascular:     Rate and Rhythm: Normal rate and regular rhythm.  Pulmonary:     Effort: Pulmonary effort is normal.     Breath sounds: Normal breath sounds.  Abdominal:     General: Bowel sounds are normal.     Palpations: Abdomen is soft.  Musculoskeletal:        General: Normal range of motion.     Cervical back: Normal range of motion and neck supple.  Skin:    General: Skin is warm.  Neurological:     General: No focal deficit present.     Mental Status: He is alert and oriented to person, place, and time.  Psychiatric:        Mood and  Affect: Mood normal.        Behavior: Behavior normal.      Assessment/Plan:  1.  Heme positive stool- Will schedule for screening colonoscopy.The risks including infection, bleed, or perforation as well as benefits, limitations, alternatives and imponderables have been reviewed with the patient. Questions have been answered. All parties agreeable.  2.  Chronic constipation-denies any issues in this regard.  No abdominal pain, bloating, straining, rectal pain or discomfort.  No rectal bleeding.  States he has been this way for 40 years.  Discussed treatment with him and he would like to hold off.  Continue OTC meds as needed.  3.  GERD-mild, intermittent, primarily at night when he eats a late meal.  Recommended he eat at least 2 hours prior to bed.  4.  Hypertension- The patient was found to have elevated blood pressure when vital signs were checked in the office. The blood pressure was rechecked by the nursing staff, and it was found be persistently elevated >140/90 mmHg. I personally advised the patient to follow up closely with the PCP for hypertension control.  Denies any chest pain, dizziness, shortness of breath, headache.  Thank you Rosaline Macadam for the kind referral  02/03/2024 9:30 AM

## 2024-02-03 NOTE — H&P (View-Only) (Signed)
 Primary Care Physician:  [name], FNP Primary Gastroenterologist:  Dr. Cindie  Chief Complaint  Patient presents with   Constipation    Sometimes goes a week before having a bm. States that it has been this way all his life.    HPI:   William Joseph is a 78 y.o. male who presents to clinic today by referral from his PCP Browning Joeann for evaluation.  Recently underwent Hemoccult which was positive.  Denies any gross melena or hematochezia.  No abdominal pain or unintentional weight loss.  No family history of colorectal malignancy.  Last colonoscopy 06/29/2015 1 polyp removed.  Benign on pathology.  Does note issues with chronic constipation for over 40 years.  Will have a bowel movement 1-2 times per week.  Denies any associated straining.  No rectal pain or discomfort.  No associated abdominal bloating or abdominal pain.  Takes OTC meds as needed.  Mild intermittent reflux.  Primarily at night.  Primarily occurs when he eats right before bedtime.  No dysphagia odynophagia.  No epigastric or chest pain.  Past Medical History:  Diagnosis Date   Anxiety    Essential hypertension, benign    Headache    History of cardiac catheterization    Normal coronaries March 2011   History of non-Hodgkin's lymphoma 06/26/2009   Qualifier: Diagnosis of  By: Mellie, CNA, Sandy     Non Hodgkin's lymphoma (HCC)    Nonerosive nonspecific gastritis    EGD - Dr. Golda   Obsessive-compulsive disorder    Palpitations    Pre-diabetes    Thyroid  mass     Past Surgical History:  Procedure Laterality Date   CATARACT EXTRACTION W/PHACO Right 03/17/2022   Procedure: CATARACT EXTRACTION PHACO AND INTRAOCULAR LENS PLACEMENT (IOC);  Surgeon: Harrie Agent, MD;  Location: [facility];  Service: Ophthalmology;  Laterality: Right;  CDE 7.71   CATARACT EXTRACTION W/PHACO Left 04/04/2022   Procedure: CATARACT EXTRACTION PHACO AND INTRAOCULAR LENS PLACEMENT (IOC);  Surgeon: Harrie Agent, MD;   Location: [facility];  Service: Ophthalmology;  Laterality: Left;  CDE 8.27   COLONOSCOPY N/A 06/29/2015   Procedure: COLONOSCOPY;  Surgeon: Lamar CHRISTELLA Hollingshead, MD;  Location: [facility];  Service: Endoscopy;  Laterality: N/A;  9:30 Am - moved to 9:15 - office to notify   ESOPHAGOGASTRODUODENOSCOPY ENDOSCOPY N/A    EXPLORATORY LAPAROTOMY     GLAUCOMA SURGERY Bilateral 11/2013   didnt work   GOLD SEED IMPLANT N/A 05/06/2021   Procedure: GOLD SEED IMPLANT;  Surgeon: Sherrilee Belvie CROME, MD;  Location: [facility];  Service: Urology;  Laterality: N/A;   LYMPH NODE BIOPSY     POLYPECTOMY  06/29/2015   Procedure: POLYPECTOMY;  Surgeon: Lamar CHRISTELLA Hollingshead, MD;  Location: [facility];  Service: Endoscopy;;  descending colon polyp removed via cold snare   Port-a-cath placement     SPACE OAR INSTILLATION N/A 05/06/2021   Procedure: SPACE OAR INSTILLATION;  Surgeon: Sherrilee Belvie CROME, MD;  Location: [facility];  Service: Urology;  Laterality: N/A;   THYROID  LOBECTOMY Right 12/04/2021   Procedure: THYROID  LOBECTOMY;  Surgeon: Carlie Clark, MD;  Location: [facility];  Service: ENT;  Laterality: Right;   Tooth implant      Current Outpatient Medications  Medication Sig Dispense Refill   Aspirin-Salicylamide-Caffeine (BC HEADACHE POWDER PO) Take 1 packet by mouth daily as needed (headache).     B Complex Vitamins (VITAMIN B COMPLEX PO) Take 1 capsule by mouth in the morning.  fluticasone  (FLONASE ) 50 MCG/ACT nasal spray Place 1 spray into both nostrils daily as needed for allergies or rhinitis.     latanoprost (XALATAN) 0.005 % ophthalmic solution Place 1 drop into both eyes at bedtime.     losartan  (COZAAR ) 25 MG tablet TAKE 1 TABLET(25 MG) BY MOUTH DAILY 30 tablet 2   Multiple Vitamin (MULTIVITAMIN WITH MINERALS) TABS tablet Take 1 tablet by mouth in the morning.     rosuvastatin  (CRESTOR ) 10 MG tablet Take 10 mg by mouth at bedtime.     tadalafil  (CIALIS ) 10 MG tablet Take 1 tablet (10 mg total) by mouth daily as  needed for erectile dysfunction. 10 tablet 3   tamsulosin  (FLOMAX ) 0.4 MG CAPS capsule TAKE 1 CAPSULE BY MOUTH EVERY DAY (Patient taking differently: 0.4 mg in the morning and at bedtime.) 30 capsule 3   No current facility-administered medications for this visit.    Allergies as of 02/03/2024 - Review Complete 02/03/2024  Allergen Reaction Noted   Tomato Itching 04/30/2021    Family History  Problem Relation Age of Onset   Diabetes Father    Coronary artery disease Mother    Hypertension Brother    Mental illness Maternal Uncle    Dementia Maternal Grandfather    Seizures Cousin    Drug abuse Other    Seizures Maternal Uncle    ADD / ADHD Neg Hx    Alcohol abuse Neg Hx    Anxiety disorder Neg Hx    Bipolar disorder Neg Hx    Depression Neg Hx    OCD Neg Hx    Paranoid behavior Neg Hx    Schizophrenia Neg Hx    Sexual abuse Neg Hx    Physical abuse Neg Hx     Social History   Socioeconomic History   Marital status: Married    Spouse name: Not on file   Number of children: Not on file   Years of education: Not on file   Highest education level: Not on file  Occupational History   Not on file  Tobacco Use   Smoking status: Light Smoker    Current packs/day: 0.00    Average packs/day: 0.3 packs/day for 20.0 years (5.0 ttl pk-yrs)    Types: Cigars, Cigarettes    Start date: 03/14/1978    Last attempt to quit: 03/14/1998    Years since quitting: 25.9   Smokeless tobacco: Never   Tobacco comments:    Puffs on cigars every so often.   Vaping Use   Vaping status: Never Used  Substance and Sexual Activity   Alcohol use: Yes    Alcohol/week: 0.0 standard drinks of alcohol    Comment: glass of wine occasionally   Drug use: No   Sexual activity: Yes  Other Topics Concern   Not on file  Social History Narrative   Not on file   Social Drivers of Health   Financial Resource Strain: Not on file  Food Insecurity: Not on file  Transportation Needs: Not on file   Physical Activity: Not on file  Stress: Not on file  Social Connections: Not on file  Intimate Partner Violence: Not on file    Subjective: Review of Systems  Constitutional:  Negative for chills and fever.  HENT:  Negative for congestion and hearing loss.   Eyes:  Negative for blurred vision and double vision.  Respiratory:  Negative for cough and shortness of breath.   Cardiovascular:  Negative for chest pain and palpitations.  Gastrointestinal:  Negative for abdominal pain, blood in stool, constipation, diarrhea, heartburn, melena and vomiting.  Genitourinary:  Negative for dysuria and urgency.  Musculoskeletal:  Negative for joint pain and myalgias.  Skin:  Negative for itching and rash.  Neurological:  Negative for dizziness and headaches.  Psychiatric/Behavioral:  Negative for depression. The patient is not nervous/anxious.        Objective: BP (!) 185/99 (BP Location: Right Arm, Patient Position: Sitting, Cuff Size: Normal) Comment (BP Location): Taken on lower forearm  Pulse 61   Temp 97.9 F (36.6 C) (Oral)   Ht 6' 2 (1.88 m)   Wt 235 lb 1.6 oz (106.6 kg)   SpO2 98%   BMI 30.19 kg/m  Physical Exam Constitutional:      Appearance: Normal appearance.  HENT:     Head: Normocephalic and atraumatic.  Eyes:     Extraocular Movements: Extraocular movements intact.     Conjunctiva/sclera: Conjunctivae normal.  Cardiovascular:     Rate and Rhythm: Normal rate and regular rhythm.  Pulmonary:     Effort: Pulmonary effort is normal.     Breath sounds: Normal breath sounds.  Abdominal:     General: Bowel sounds are normal.     Palpations: Abdomen is soft.  Musculoskeletal:        General: Normal range of motion.     Cervical back: Normal range of motion and neck supple.  Skin:    General: Skin is warm.  Neurological:     General: No focal deficit present.     Mental Status: He is alert and oriented to person, place, and time.  Psychiatric:        Mood and  Affect: Mood normal.        Behavior: Behavior normal.      Assessment/Plan:  1.  Heme positive stool- Will schedule for screening colonoscopy.The risks including infection, bleed, or perforation as well as benefits, limitations, alternatives and imponderables have been reviewed with the patient. Questions have been answered. All parties agreeable.  2.  Chronic constipation-denies any issues in this regard.  No abdominal pain, bloating, straining, rectal pain or discomfort.  No rectal bleeding.  States he has been this way for 40 years.  Discussed treatment with him and he would like to hold off.  Continue OTC meds as needed.  3.  GERD-mild, intermittent, primarily at night when he eats a late meal.  Recommended he eat at least 2 hours prior to bed.  4.  Hypertension- The patient was found to have elevated blood pressure when vital signs were checked in the office. The blood pressure was rechecked by the nursing staff, and it was found be persistently elevated >140/90 mmHg. I personally advised the patient to follow up closely with the PCP for hypertension control.  Denies any chest pain, dizziness, shortness of breath, headache.  Thank you Rosaline Macadam for the kind referral  02/03/2024 9:30 AM

## 2024-02-03 NOTE — Telephone Encounter (Signed)
 PA for TCS with [employer]- Called number on back of card. Prior authorization is not required.

## 2024-02-03 NOTE — Telephone Encounter (Signed)
 Spoke with patient in office, scheduled for 02/12/2024 at 2pm. Rx sent to pharmacy. Instructions given to patient.

## 2024-02-10 ENCOUNTER — Other Ambulatory Visit: Payer: Self-pay

## 2024-02-10 ENCOUNTER — Encounter (HOSPITAL_COMMUNITY): Payer: Self-pay

## 2024-02-10 ENCOUNTER — Encounter (HOSPITAL_COMMUNITY)
Admission: RE | Admit: 2024-02-10 | Discharge: 2024-02-10 | Disposition: A | Discharge status: Home / Self Care | Source: Ambulatory Visit | Attending: Internal Medicine | Admitting: Internal Medicine

## 2024-02-10 NOTE — Pre-Procedure Instructions (Signed)
 Attempted pre-op phone call. Left VM for him to call us back.

## 2024-02-12 ENCOUNTER — Other Ambulatory Visit: Payer: Self-pay

## 2024-02-12 ENCOUNTER — Ambulatory Visit (HOSPITAL_COMMUNITY)
Admission: RE | Admit: 2024-02-12 | Discharge: 2024-02-12 | Disposition: A | Discharge status: Home / Self Care | Attending: Internal Medicine | Admitting: Internal Medicine

## 2024-02-12 ENCOUNTER — Encounter (HOSPITAL_COMMUNITY)
Admission: RE | Disposition: A | Discharge status: Home / Self Care | Payer: Self-pay | Source: Home / Self Care | Attending: Internal Medicine

## 2024-02-12 ENCOUNTER — Encounter (HOSPITAL_COMMUNITY): Payer: Self-pay | Admitting: Internal Medicine

## 2024-02-12 ENCOUNTER — Ambulatory Visit (HOSPITAL_COMMUNITY): Payer: Self-pay | Admitting: Certified Registered Nurse Anesthetist

## 2024-02-12 ENCOUNTER — Encounter (HOSPITAL_COMMUNITY): Payer: Self-pay | Admitting: Certified Registered Nurse Anesthetist

## 2024-02-12 DIAGNOSIS — K921 Melena: Secondary | ICD-10-CM | POA: Diagnosis not present

## 2024-02-12 DIAGNOSIS — K635 Polyp of colon: Secondary | ICD-10-CM

## 2024-02-12 DIAGNOSIS — R0602 Shortness of breath: Secondary | ICD-10-CM | POA: Diagnosis not present

## 2024-02-12 DIAGNOSIS — D649 Anemia, unspecified: Secondary | ICD-10-CM | POA: Insufficient documentation

## 2024-02-12 DIAGNOSIS — F32A Depression, unspecified: Secondary | ICD-10-CM | POA: Insufficient documentation

## 2024-02-12 DIAGNOSIS — G4730 Sleep apnea, unspecified: Secondary | ICD-10-CM | POA: Diagnosis not present

## 2024-02-12 DIAGNOSIS — F419 Anxiety disorder, unspecified: Secondary | ICD-10-CM | POA: Insufficient documentation

## 2024-02-12 DIAGNOSIS — K648 Other hemorrhoids: Secondary | ICD-10-CM | POA: Diagnosis not present

## 2024-02-12 DIAGNOSIS — K5909 Other constipation: Secondary | ICD-10-CM | POA: Diagnosis not present

## 2024-02-12 DIAGNOSIS — K2970 Gastritis, unspecified, without bleeding: Secondary | ICD-10-CM | POA: Insufficient documentation

## 2024-02-12 DIAGNOSIS — R195 Other fecal abnormalities: Secondary | ICD-10-CM | POA: Insufficient documentation

## 2024-02-12 DIAGNOSIS — R519 Headache, unspecified: Secondary | ICD-10-CM | POA: Insufficient documentation

## 2024-02-12 DIAGNOSIS — K219 Gastro-esophageal reflux disease without esophagitis: Secondary | ICD-10-CM | POA: Diagnosis not present

## 2024-02-12 DIAGNOSIS — K5730 Diverticulosis of large intestine without perforation or abscess without bleeding: Secondary | ICD-10-CM

## 2024-02-12 DIAGNOSIS — Z8572 Personal history of non-Hodgkin lymphomas: Secondary | ICD-10-CM | POA: Insufficient documentation

## 2024-02-12 DIAGNOSIS — I10 Essential (primary) hypertension: Secondary | ICD-10-CM | POA: Diagnosis not present

## 2024-02-12 DIAGNOSIS — R002 Palpitations: Secondary | ICD-10-CM | POA: Diagnosis not present

## 2024-02-12 DIAGNOSIS — Z79899 Other long term (current) drug therapy: Secondary | ICD-10-CM | POA: Diagnosis not present

## 2024-02-12 DIAGNOSIS — F429 Obsessive-compulsive disorder, unspecified: Secondary | ICD-10-CM | POA: Insufficient documentation

## 2024-02-12 DIAGNOSIS — D122 Benign neoplasm of ascending colon: Secondary | ICD-10-CM

## 2024-02-12 DIAGNOSIS — F17210 Nicotine dependence, cigarettes, uncomplicated: Secondary | ICD-10-CM | POA: Diagnosis not present

## 2024-02-12 DIAGNOSIS — Z1211 Encounter for screening for malignant neoplasm of colon: Secondary | ICD-10-CM | POA: Diagnosis present

## 2024-02-12 DIAGNOSIS — D123 Benign neoplasm of transverse colon: Secondary | ICD-10-CM | POA: Diagnosis not present

## 2024-02-12 HISTORY — PX: COLONOSCOPY: SHX5424

## 2024-02-12 SURGERY — COLONOSCOPY
Anesthesia: General

## 2024-02-12 MED ORDER — PROPOFOL 500 MG/50ML IV EMUL
INTRAVENOUS | Status: DC | PRN
  Administered 2024-02-12: 20 mg via INTRAVENOUS
  Administered 2024-02-12: 80 mg via INTRAVENOUS
  Administered 2024-02-12: 150 ug/kg/min via INTRAVENOUS

## 2024-02-12 MED ORDER — LACTATED RINGERS IV SOLN: INTRAVENOUS | Status: DC

## 2024-02-12 NOTE — Op Note (Signed)
 [facility] Patient Name: William Joseph Procedure Date: 02/12/2024 12:27 PM MRN: [id number] Date of Birth: Mar 31, 1946 Attending MD: Carlin POUR. Cindie , [place], [id number] CSN: [id number] Age: 78 Admit Type: Outpatient Procedure:                Colonoscopy Indications:              Screening for colorectal malignant neoplasm,                            Incidental - Heme positive stool Providers:                Carlin POUR. Cindie, DO, Leandrew Edelman RN, RN, Bascom Blush Referring MD:              Medicines:                See the Anesthesia note for documentation of the                            administered medications Complications:            No immediate complications. Estimated Blood Loss:     Estimated blood loss was minimal. Procedure:                Pre-Anesthesia Assessment:                           - The anesthesia plan was to use monitored                            anesthesia care (MAC).                           After obtaining informed consent, the colonoscope                            was passed under direct vision. Throughout the                            procedure, the patient's blood pressure, pulse, and                            oxygen saturations were monitored continuously. The                            PCF-HQ190L ([id number]) Peds Colon was introduced                            through the anus and advanced to the the cecum,                            identified by appendiceal orifice and ileocecal                            valve. The colonoscopy was performed without  difficulty. The patient tolerated the procedure                            well. The quality of the bowel preparation was                            evaluated using the BBPS [facility] Bowel Preparation                            Scale) with scores of: Right Colon = 3, Transverse                            Colon = 3 and Left Colon = 3 (entire  mucosa seen                            well with no residual staining, small fragments of                            stool or opaque liquid). The total BBPS score                            equals 9. Scope In: 12:47:40 PM Scope Out: 1:03:08 PM Scope Withdrawal Time: 0 hours 12 minutes 10 seconds  Total Procedure Duration: 0 hours 15 minutes 28 seconds  Findings:      Non-bleeding internal hemorrhoids were found.      Scattered large-mouthed and small-mouthed diverticula were found in the       entire colon.      A 2 mm polyp was found in the ascending colon. The polyp was sessile.       The polyp was removed with a cold biopsy forceps. Resection and       retrieval were complete.      Two sessile polyps were found in the transverse colon. The polyps were 5       to 6 mm in size. These polyps were removed with a cold snare. Resection       and retrieval were complete.      A 5 mm polyp was found in the sigmoid colon. The polyp was       semi-pedunculated. The polyp was removed with a cold snare. Resection       and retrieval were complete.      The exam was otherwise without abnormality. Impression:               - Non-bleeding internal hemorrhoids.                           - Diverticulosis in the entire examined colon.                           - One 2 mm polyp in the ascending colon, removed                            with a cold biopsy forceps. Resected and retrieved.                           -  Two 5 to 6 mm polyps in the transverse colon,                            removed with a cold snare. Resected and retrieved.                           - One 5 mm polyp in the sigmoid colon, removed with                            a cold snare. Resected and retrieved.                           - The examination was otherwise normal. Moderate Sedation:      Per Anesthesia Care Recommendation:           - Patient has a contact number available for                            emergencies. The  signs and symptoms of potential                            delayed complications were discussed with the                            patient. Return to normal activities tomorrow.                            Written discharge instructions were provided to the                            patient.                           - Resume previous diet.                           - Continue present medications.                           - Await pathology results.                           - No repeat colonoscopy due to age.                           - Return to GI clinic PRN. Procedure Code(s):        --- Professional ---                           [id number], Colonoscopy, flexible; with removal of                            tumor(s), polyp(s), or other lesion(s) by snare                            technique  54619, 59, Colonoscopy, flexible; with biopsy,                            single or multiple Diagnosis Code(s):        --- Professional ---                           Z12.11, Encounter for screening for malignant                            neoplasm of colon                           K64.8, Other hemorrhoids                           D12.2, Benign neoplasm of ascending colon                           D12.5, Benign neoplasm of sigmoid colon                           D12.3, Benign neoplasm of transverse colon (hepatic                            flexure or splenic flexure)                           K57.30, Diverticulosis of large intestine without                            perforation or abscess without bleeding CPT copyright 2022 American Medical Association. All rights reserved. The codes documented in this report are preliminary and upon coder review may  be revised to meet current compliance requirements. Carlin POUR. Cindie, DO Carlin POUR. Cindie, DO 02/12/2024 1:06:45 PM This report has been signed electronically. Number of Addenda: 0

## 2024-02-12 NOTE — Interval H&P Note (Signed)
 History and Physical Interval Note:  02/12/2024 12:37 PM  William Joseph  has presented today for surgery, with the diagnosis of heme positive stool.  The various methods of treatment have been discussed with the patient and family. After consideration of risks, benefits and other options for treatment, the patient has consented to  Procedure(s) with comments: COLONOSCOPY (N/A) - 2:00pm, ASA 3 as a surgical intervention.  The patient's history has been reviewed, patient examined, no change in status, stable for surgery.  I have reviewed the patient's chart and labs.  Questions were answered to the patient's satisfaction.     [name]

## 2024-02-12 NOTE — Anesthesia Postprocedure Evaluation (Signed)
 Anesthesia Post Note  Patient: William Joseph  Procedure(s) Performed: COLONOSCOPY  Patient location during evaluation: Phase II Anesthesia Type: General Level of consciousness: awake and alert Pain management: pain level controlled Vital Signs Assessment: post-procedure vital signs reviewed and stable Respiratory status: spontaneous breathing, nonlabored ventilation and respiratory function stable Cardiovascular status: stable Anesthetic complications: no   There were no known notable events for this encounter.   Last Vitals:  Vitals:   02/12/24 1307 02/12/24 1322  BP: (!) 95/46 (!) 148/77  Pulse: 65   Resp: 14   Temp: 36.9 C   SpO2: 98%     Last Pain:  Vitals:   02/12/24 1307  TempSrc: Oral  PainSc: 0-No pain                 Marshae Azam L Kellianne Ek

## 2024-02-12 NOTE — Discharge Instructions (Addendum)
  Colonoscopy Discharge Instructions  Read the instructions outlined below and refer to this sheet in the next few weeks. These discharge instructions provide you with general information on caring for yourself after you leave the hospital. Your doctor may also give you specific instructions. While your treatment has been planned according to the most current medical practices available, unavoidable complications occasionally occur.   ACTIVITY You may resume your regular activity, but move at a slower pace for the next 24 hours.  Take frequent rest periods for the next 24 hours.  Walking will help get rid of the air and reduce the bloated feeling in your belly (abdomen).  No driving for 24 hours (because of the medicine (anesthesia) used during the test).   Do not sign any important legal documents or operate any machinery for 24 hours (because of the anesthesia used during the test).  NUTRITION Drink plenty of fluids.  You may resume your normal diet as instructed by your doctor.  Begin with a light meal and progress to your normal diet. Heavy or fried foods are harder to digest and may make you feel sick to your stomach (nauseated).  Avoid alcoholic beverages for 24 hours or as instructed.  MEDICATIONS You may resume your normal medications unless your doctor tells you otherwise.  WHAT YOU CAN EXPECT TODAY Some feelings of bloating in the abdomen.  Passage of more gas than usual.  Spotting of blood in your stool or on the toilet paper.  IF YOU HAD POLYPS REMOVED DURING THE COLONOSCOPY: No aspirin products for 7 days or as instructed.  No alcohol for 7 days or as instructed.  Eat a soft diet for the next 24 hours.  FINDING OUT THE RESULTS OF YOUR TEST Not all test results are available during your visit. If your test results are not back during the visit, make an appointment with your caregiver to find out the results. Do not assume everything is normal if you have not heard from your  caregiver or the medical facility. It is important for you to follow up on all of your test results.  SEEK IMMEDIATE MEDICAL ATTENTION IF: You have more than a spotting of blood in your stool.  Your belly is swollen (abdominal distention).  You are nauseated or vomiting.  You have a temperature over 101.  You have abdominal pain or discomfort that is severe or gets worse throughout the day.   Your colonoscopy revealed 4 small polyp(s) which I removed successfully. Await pathology results, my office will contact you.  Given your age, I do not think you need further colonoscopies for polyp surveillance.   You also have diverticulosis and internal hemorrhoids. I would recommend increasing fiber in your diet or adding OTC Benefiber/Metamucil. Be sure to drink at least 4 to 6 glasses of water  daily. Follow-up with GI as needed.   I hope you have a great rest of your week!  Carlin POUR. Cindie, D.O. Gastroenterology and Hepatology [facility]

## 2024-02-12 NOTE — Anesthesia Preprocedure Evaluation (Signed)
 Anesthesia Evaluation  Patient identified by MRN, date of birth, ID band Patient awake    Reviewed: Allergy & Precautions, H&P , NPO status , Patient's Chart, lab work & pertinent test results  Airway Mallampati: III  TM Distance: >3 FB Neck ROM: Full    Dental  (+) Implants, Dental Advisory Given   Pulmonary shortness of breath and with exertion, sleep apnea , Current Smoker   Pulmonary exam normal breath sounds clear to auscultation       Cardiovascular hypertension, Pt. on medications Normal cardiovascular exam Rhythm:Regular Rate:Normal  palpitations   Neuro/Psych  Headaches PSYCHIATRIC DISORDERS Anxiety Depression    OCD Neuromuscular disease    GI/Hepatic Neg liver ROS,,,gastritis   Endo/Other  negative endocrine ROS    Renal/GU negative Renal ROS  negative genitourinary   Musculoskeletal negative musculoskeletal ROS (+)    Abdominal   Peds negative pediatric ROS (+)  Hematology  (+) Blood dyscrasia (Non Hodgkin's lymphoma), anemia   Anesthesia Other Findings Thyroid  cancer. Non Hodgkins lymphoma  Reproductive/Obstetrics negative OB ROS                              Anesthesia Physical Anesthesia Plan  ASA: 3  Anesthesia Plan: General   Post-op Pain Management: Minimal or no pain anticipated   Induction: Intravenous  PONV Risk Score and Plan: Propofol  infusion  Airway Management Planned: Nasal Cannula and Natural Airway  Additional Equipment: None  Intra-op Plan:   Post-operative Plan:   Informed Consent: I have reviewed the patients History and Physical, chart, labs and discussed the procedure including the risks, benefits and alternatives for the proposed anesthesia with the patient or authorized representative who has indicated his/her understanding and acceptance.       Plan Discussed with: CRNA  Anesthesia Plan Comments:          Anesthesia Quick  Evaluation

## 2024-02-12 NOTE — Transfer of Care (Signed)
 Immediate Anesthesia Transfer of Care Note  Patient: William Joseph  Procedure(s) Performed: COLONOSCOPY  Patient Location: Short Stay  Anesthesia Type:General  Level of Consciousness: drowsy  Airway & Oxygen Therapy: Patient Spontanous Breathing  Post-op Assessment: Report given to RN and Post -op Vital signs reviewed and stable  Post vital signs: Reviewed and stable  Last Vitals:  Vitals Value Taken Time  BP 95/46   Temp    Pulse 56   Resp 16   SpO2 99%     Last Pain:  Vitals:   02/12/24 1244  TempSrc:   PainSc: 0-No pain         Complications: No notable events documented.

## 2024-02-15 ENCOUNTER — Encounter (HOSPITAL_COMMUNITY): Payer: Self-pay | Admitting: Internal Medicine

## 2024-02-15 LAB — SURGICAL PATHOLOGY

## 2024-02-22 ENCOUNTER — Ambulatory Visit: Admitting: Urology

## 2024-02-22 ENCOUNTER — Telehealth: Payer: Self-pay

## 2024-02-22 VITALS — BP 173/68 | HR 59

## 2024-02-22 DIAGNOSIS — N5201 Erectile dysfunction due to arterial insufficiency: Secondary | ICD-10-CM

## 2024-02-22 DIAGNOSIS — N529 Male erectile dysfunction, unspecified: Secondary | ICD-10-CM | POA: Diagnosis not present

## 2024-02-22 DIAGNOSIS — N400 Enlarged prostate without lower urinary tract symptoms: Secondary | ICD-10-CM | POA: Diagnosis not present

## 2024-02-22 DIAGNOSIS — R351 Nocturia: Secondary | ICD-10-CM

## 2024-02-22 DIAGNOSIS — Z8546 Personal history of malignant neoplasm of prostate: Secondary | ICD-10-CM

## 2024-02-22 DIAGNOSIS — N138 Other obstructive and reflux uropathy: Secondary | ICD-10-CM

## 2024-02-22 LAB — URINALYSIS, ROUTINE W REFLEX MICROSCOPIC
Bilirubin, UA: NEGATIVE
Glucose, UA: NEGATIVE
Ketones, UA: NEGATIVE
Leukocytes,UA: NEGATIVE
Nitrite, UA: NEGATIVE
Protein,UA: NEGATIVE
RBC, UA: NEGATIVE
Specific Gravity, UA: <1.005 — ABNORMAL LOW (ref 1.005–1.030)
Urobilinogen, Ur: 0.2 mg/dL (ref 0.2–1.0)
pH, UA: 7 (ref 5.0–7.5)

## 2024-02-22 MED ORDER — TAMSULOSIN HCL 0.4 MG PO CAPS: 0.4000 mg | ORAL_CAPSULE | Freq: Two times a day (BID) | ORAL | 3 refills | Status: AC

## 2024-02-22 MED ORDER — TADALAFIL 5 MG PO TABS: 5.0000 mg | ORAL_TABLET | Freq: Every day | ORAL | 3 refills | Status: AC

## 2024-02-22 NOTE — Telephone Encounter (Signed)
 Medication prior authorization request received.  Completed PA request through cover my meds for drug tadalafil . KEY:  [id number]   Approved: Yes

## 2024-02-22 NOTE — Telephone Encounter (Signed)
 Medication prior authorization request received.  Completed PA request through cover my meds for drug Tadalafil  5 MG.  KEY: [id number]  Approved: Pending

## 2024-02-22 NOTE — Progress Notes (Unsigned)
 02/22/2024 10:57 AM   William Joseph 1945/05/23 [id number]  Referring provider: [name], FNP [address] [place] William Joseph,  [place] [address]  No chief complaint on file.   HPI:  F/u -    1) prostate cancer -diagnosed with intermediate to high risk prostate cancer November 2022 (PSA 10.1 or 20.2 on 5ari, T1c, prostate 25 g, grade group 3, grade group 2 in 5/12) and treated with ST-ADT (04/26/2021 - 10/24/2021) and IMRT (April 2023) with William Joseph. His Jul 2023 PSA is udt and T < 3. He has bothersome hot flashes. His Oct 2023 T 234, PSA < 0.1.  April 2024 PSA undetectable, T277.    Staging: -December 2022 PET scan-left hemiprostate activity but no metastatic disease.  Pulmonary nodules, right neck activity adjacent to the thyroid  gland. -December 2022 thyroid  ultrasound-1.1 cm right mid hypoechoic prostate nodule noted to be a TR 5 risk category lesion. -December 2022 CT chest-benign appearing pulmonary nodules.  Repeat chest CT around June 2023 -Jul 2023 - CT chest - Stable 1.7 cm ground-glass nodule in lingula. Continued follow-up by chest CT is recommended in 2 years (in 2025 and until 5 years of stability - 2027)     2) BPH - on tamsulosin  and tadalafil  daily. AUASS = 14 now 24. Symptoms of freq, urge, and nocturia. His PVR is 0 ml. Stop dutasteride  Apr 2023.    3) ED - he is on daily tadlafil with mixed results. He has trouble getting and maintaining erection.    Today, seen for the above.  Due for a PSA. He is taking tamsulosin . He is taking a daily tadalafil . He had OSA and needed CPAP in past. NOC x 3. Good stream. They are pretty adamant he had a PSA this year with William Joseph that was  less than 1.   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 William Joseph removed his thyroid  and he has thyroid  cancer under surveillance. He has bothersome hot flashes.  PMH: Past Medical History:  Diagnosis Date   Anxiety    Essential hypertension, benign     Headache    History of cardiac catheterization    Normal coronaries March 2011   History of non-Hodgkin's lymphoma 06/26/2009   Qualifier: Diagnosis of  By: Mellie, CNA, William Joseph     Non Hodgkin's lymphoma (HCC)    Nonerosive nonspecific gastritis    EGD - Dr. Golda   Obsessive-compulsive disorder    Palpitations    Thyroid  mass     Surgical History: Past Surgical History:  Procedure Laterality Date   CATARACT EXTRACTION W/PHACO Right 03/17/2022   Procedure: CATARACT EXTRACTION PHACO AND INTRAOCULAR LENS PLACEMENT (IOC);  Surgeon: William Agent, MD;  Location: [facility];  Service: Ophthalmology;  Laterality: Right;  CDE 7.71   CATARACT EXTRACTION W/PHACO Left 04/04/2022   Procedure: CATARACT EXTRACTION PHACO AND INTRAOCULAR LENS PLACEMENT (IOC);  Surgeon: William Agent, MD;  Location: [facility];  Service: Ophthalmology;  Laterality: Left;  CDE 8.27   COLONOSCOPY N/A 06/29/2015   Procedure: COLONOSCOPY;  Surgeon: William CHRISTELLA Hollingshead, MD;  Location: [facility];  Service: Endoscopy;  Laterality: N/A;  9:30 Am - moved to 9:15 - office to notify   COLONOSCOPY N/A 02/12/2024   Procedure: COLONOSCOPY;  Surgeon: William Carlin POUR, DO;  Location: [facility];  Service: Endoscopy;  Laterality: N/A;  2:00pm, ASA 3   ESOPHAGOGASTRODUODENOSCOPY ENDOSCOPY N/A    EXPLORATORY LAPAROTOMY     GLAUCOMA SURGERY Bilateral 11/2013  didnt work   GOLD SEED IMPLANT N/A 05/06/2021   Procedure: GOLD SEED IMPLANT;  Surgeon: William Belvie CROME, MD;  Location: [facility];  Service: Urology;  Laterality: N/A;   LYMPH NODE BIOPSY     POLYPECTOMY  06/29/2015   Procedure: POLYPECTOMY;  Surgeon: William CHRISTELLA Hollingshead, MD;  Location: [facility];  Service: Endoscopy;;  descending colon polyp removed via cold snare   Port-a-cath placement     SPACE OAR INSTILLATION N/A 05/06/2021   Procedure: SPACE OAR INSTILLATION;  Surgeon: William Belvie CROME, MD;  Location: [facility];  Service: Urology;  Laterality: N/A;   THYROID  LOBECTOMY Right  12/04/2021   Procedure: THYROID  LOBECTOMY;  Surgeon: Joseph Clark, MD;  Location: [facility];  Service: ENT;  Laterality: Right;   Tooth implant      Home Medications:  Allergies as of 02/22/2024       Reactions   Tomato Itching        Medication List        Accurate as of February 22, 2024 10:57 AM. If you have any questions, ask your nurse or doctor.          BC HEADACHE POWDER PO Take 1 packet by mouth daily as needed (headache).   fluticasone  50 MCG/ACT nasal spray Commonly known as: FLONASE  Place 1 spray into both nostrils daily as needed for allergies or rhinitis.   latanoprost 0.005 % ophthalmic solution Commonly known as: XALATAN Place 1 drop into both eyes at bedtime.   losartan  25 MG tablet Commonly known as: COZAAR  TAKE 1 TABLET(25 MG) BY MOUTH DAILY   multivitamin with minerals Tabs tablet Take 1 tablet by mouth in the morning.   rosuvastatin  10 MG tablet Commonly known as: CRESTOR  Take 10 mg by mouth at bedtime.   tadalafil  10 MG tablet Commonly known as: CIALIS  Take 1 tablet (10 mg total) by mouth daily as needed for erectile dysfunction.   tamsulosin  0.4 MG Caps capsule Commonly known as: FLOMAX  TAKE 1 CAPSULE BY MOUTH EVERY DAY What changed:  how to take this when to take this   VITAMIN B COMPLEX PO Take 1 capsule by mouth in the morning.        Allergies:  Allergies  Allergen Reactions   Tomato Itching    Family History: Family History  Problem Relation Age of Onset   Diabetes Father    Coronary artery disease Mother    Hypertension Brother    Mental illness Maternal Uncle    Dementia Maternal Grandfather    Seizures Cousin    Drug abuse Other    Seizures Maternal Uncle    ADD / ADHD Neg Hx    Alcohol abuse Neg Hx    Anxiety disorder Neg Hx    Bipolar disorder Neg Hx    Depression Neg Hx    OCD Neg Hx    Paranoid behavior Neg Hx    Schizophrenia Neg Hx    Sexual abuse Neg Hx    Physical abuse Neg Hx     Social  History:  reports that he has been smoking cigars. He has never used smokeless tobacco. He reports current alcohol use. He reports that he does not use drugs.   Physical Exam: There were no vitals taken for this visit.  Constitutional:  Alert and oriented, No acute distress. HEENT: [place] AT, moist mucus membranes.  Trachea midline, no masses. Cardiovascular: No clubbing, cyanosis, or edema. Respiratory: Normal respiratory effort, no increased work of breathing. GI: Abdomen  is soft, nontender, nondistended, no abdominal masses GU: No CVA tenderness Skin: No rashes, bruises or suspicious lesions. Neurologic: Grossly intact, no focal deficits, moving all 4 extremities. Psychiatric: Normal mood and affect.  Laboratory Data: Lab Results  Component Value Date   WBC 4.3 12/04/2021   HGB 11.9 (L) 12/04/2021   HCT 36.9 (L) 12/04/2021   MCV 93.9 12/04/2021   PLT 143 (L) 12/04/2021    Lab Results  Component Value Date   CREATININE 1.06 12/04/2021    Lab Results  Component Value Date   PSA 5.4 05/18/2017    Lab Results  Component Value Date   TESTOSTERONE  277 08/04/2022    Lab Results  Component Value Date   HGBA1C 6.2 (H) 05/02/2021    Urinalysis    Component Value Date/Time   APPEARANCEUR Clear 02/10/2022 1042   GLUCOSEU Negative 02/10/2022 1042   BILIRUBINUR Negative 02/10/2022 1042   PROTEINUR Negative 02/10/2022 1042   NITRITE Negative 02/10/2022 1042   LEUKOCYTESUR Negative 02/10/2022 1042    Lab Results  Component Value Date   LABMICR Comment 02/10/2022    Pertinent Imaging: N/a  Assessment & Plan:    Bph, nocturia -discussed adding other meds. He should consider another sleep study with PCP. Cont tams and daily tadalafil .   H/o PCa - PSA reported as low.   No follow-ups on file.  Donnice Brooks, MD  [facility]  [address] [place], [place] [address] [phone]

## 2024-03-07 ENCOUNTER — Ambulatory Visit (HOSPITAL_COMMUNITY)
Admission: RE | Admit: 2024-03-07 | Discharge: 2024-03-07 | Disposition: A | Discharge status: Home / Self Care | Source: Ambulatory Visit | Attending: Nurse Practitioner | Admitting: Nurse Practitioner

## 2024-03-07 DIAGNOSIS — R918 Other nonspecific abnormal finding of lung field: Secondary | ICD-10-CM | POA: Insufficient documentation

## 2024-03-22 ENCOUNTER — Inpatient Hospital Stay: Attending: Oncology | Admitting: Oncology

## 2024-03-22 ENCOUNTER — Inpatient Hospital Stay

## 2024-03-22 VITALS — BP 153/73 | HR 84 | Temp 97.9°F | Resp 20 | Wt 233.9 lb

## 2024-03-22 DIAGNOSIS — R918 Other nonspecific abnormal finding of lung field: Secondary | ICD-10-CM | POA: Insufficient documentation

## 2024-03-22 DIAGNOSIS — Z79899 Other long term (current) drug therapy: Secondary | ICD-10-CM | POA: Insufficient documentation

## 2024-03-22 DIAGNOSIS — C61 Malignant neoplasm of prostate: Secondary | ICD-10-CM | POA: Diagnosis not present

## 2024-03-22 DIAGNOSIS — Z8546 Personal history of malignant neoplasm of prostate: Secondary | ICD-10-CM | POA: Diagnosis not present

## 2024-03-22 DIAGNOSIS — Z8572 Personal history of non-Hodgkin lymphomas: Secondary | ICD-10-CM | POA: Diagnosis not present

## 2024-03-22 DIAGNOSIS — Z85850 Personal history of malignant neoplasm of thyroid: Secondary | ICD-10-CM | POA: Insufficient documentation

## 2024-03-22 DIAGNOSIS — Z87891 Personal history of nicotine dependence: Secondary | ICD-10-CM | POA: Insufficient documentation

## 2024-03-22 DIAGNOSIS — C859A Non-Hodgkin lymphoma, unspecified, in remission: Secondary | ICD-10-CM | POA: Insufficient documentation

## 2024-03-22 DIAGNOSIS — C73 Malignant neoplasm of thyroid gland: Secondary | ICD-10-CM | POA: Diagnosis not present

## 2024-03-22 NOTE — Patient Instructions (Addendum)
 [facility]  Discharge Instructions  You were seen and examined today by Dr. Davonna. Dr. Davonna is a medical oncologist, meaning that she specializes in the treatment of cancer diagnoses. Dr. Davonna discussed your past medical history, family history of cancers, and the events that led to you being here today.  You were referred to Dr. Davonna due to an abnormal CT scan which revealed lung nodules. With your history of cancer, it can be concerning for cancer.  Dr. Davonna has recommended a PET scan. A PET scan is a cancer specific CT scan that causes cancer to glow on the scan. Should the nodules glow, Dr. Davonna would then discuss a biopsy. If there is now glowing, Dr. Davonna would consider routine monitoring.  Follow-up as scheduled after your PET scan.  Thank you for choosing [facility] to provide your oncology and hematology care.   To afford each patient quality time with our provider, please arrive at least 15 minutes before your scheduled appointment time. You may need to reschedule your appointment if you arrive late (10 or more minutes). Arriving late affects you and other patients whose appointments are after yours.  Also, if you miss three or more appointments without notifying the office, you may be dismissed from the clinic at the providers discretion.    Again, thank you for choosing [facility].  Our hope is that these requests will decrease the amount of time that you wait before being seen by our physicians.   If you have a lab appointment with the Cancer Center - please note that after April 8th, all labs will be drawn in the cancer center.  You do not have to check in or register with the main entrance as you have in the past but will complete your check-in at the cancer center.            _____________________________________________________________  Should you have questions after your visit to [facility], please contact our office at [phone] and follow the prompts.  Our office hours are 8:00 a.m. to 4:30 p.m. Monday - Thursday and 8:00 a.m. to 2:30 p.m. Friday.  Please note that voicemails left after 4:00 p.m. may not be returned until the following business day.  We are closed weekends and all major holidays.  You do have access to a nurse 24-7, just call the main number to the clinic [phone] and do not press any options, hold on the line and a nurse will answer the phone.    For prescription refill requests, have your pharmacy contact our office and allow 72 hours.    Masks are no longer required in the cancer centers. If you would like for your care team to wear a mask while they are taking care of you, please let them know. You may have one support person who is at least 76 years old accompany you for your appointments.

## 2024-03-22 NOTE — Progress Notes (Signed)
 Hematology-Oncology Clinic Note  William Browning, FNP   Reason for Referral: Abnormal lung findings on imaging  Oncology History: I have reviewed his chart and materials related to his cancer extensively and collaborated history with the patient. Summary of oncologic history is as follows:  Diagnosis: Multiple lung nodules  -12/23/2023: CT chest: Stable 15 mm ground glass opacity within the left upper lobe. Nodular subpleural focal pulmonary infiltrate in the basilar intermediate segment of the left lower lobe and right costophrenic sulcus, possibly post-infectious or postinflammatory. Short-term follow-up imaging in 6-12 weeks would be helpful in documenting resolution.  -03/08/2024: CT chest: Stable 1.4 cm left upper lobe ground-glass opacity. Stable 1.1 cm right upper lobe ground-glass opacity. Interval development of several new solid pulmonary nodules, including multiple right upper lobe micronodules, a right lower lobe subpleural micronodule, and a new 6 mm solid left upper lobe nodule. Stable 2.3 x 1.3 cm left lower lobe pulmonary nodule with interval decrease in size of the adjacent nodule to 1.3 x 0.9 cm.   History of Presenting Illness: William Joseph is a 78 y.o. male being referred for abnormal lung findings on imaging by William Browning, FNP. He is accompanied by his wife.  Patient has a medical history of stage Ib papillary thyroid  carcinoma s/p right thyroidectomy on 12/04/2021, stage IIC prostate cancer, non Hodgkin's lymphoma s/p EPOCH(1 dose) and RCHOP, hypertension, sleep apnea, and prediabetes.  We discussed further imaging and possible biopsy of lung nodules. He denies any cough. He reports sleeping well now, after previously having congestion and headaches when waking up in the mornings that resolved after using a humidifier and changing his thermostat.   William Joseph reports he had a COVID infection in March 2025.   He [place] in [place] with his  wife. He quit smoking 20 years ago.   Medical History: Past Medical History:  Diagnosis Date   Anxiety    Essential hypertension, benign    Headache    History of cardiac catheterization    Normal coronaries March 2011   History of non-Hodgkin's lymphoma 06/26/2009   Qualifier: Diagnosis of  By: Mellie, CNA, Sandy     Non Hodgkin's lymphoma (HCC)    Nonerosive nonspecific gastritis    EGD - Dr. Golda   Obsessive-compulsive disorder    Palpitations    Thyroid  mass     Surgical history: Past Surgical History:  Procedure Laterality Date   CATARACT EXTRACTION W/PHACO Right 03/17/2022   Procedure: CATARACT EXTRACTION PHACO AND INTRAOCULAR LENS PLACEMENT (IOC);  Surgeon: Harrie Agent, MD;  Location: [facility];  Service: Ophthalmology;  Laterality: Right;  CDE 7.71   CATARACT EXTRACTION W/PHACO Left 04/04/2022   Procedure: CATARACT EXTRACTION PHACO AND INTRAOCULAR LENS PLACEMENT (IOC);  Surgeon: Harrie Agent, MD;  Location: [facility];  Service: Ophthalmology;  Laterality: Left;  CDE 8.27   COLONOSCOPY N/A 06/29/2015   Procedure: COLONOSCOPY;  Surgeon: Lamar CHRISTELLA Hollingshead, MD;  Location: [facility];  Service: Endoscopy;  Laterality: N/A;  9:30 Am - moved to 9:15 - office to notify   COLONOSCOPY N/A 02/12/2024   Procedure: COLONOSCOPY;  Surgeon: Cindie Carlin POUR, DO;  [facility]: [facility];  Service: Endoscopy;  Laterality: N/A;  2:00pm, ASA 3   ESOPHAGOGASTRODUODENOSCOPY ENDOSCOPY N/A    EXPLORATORY LAPAROTOMY     GLAUCOMA SURGERY Bilateral 11/2013   didnt work   GOLD SEED IMPLANT N/A 05/06/2021   Procedure: GOLD SEED IMPLANT;  Surgeon: Sherrilee Belvie CROME, MD;  Location: [facility];  Service: Urology;  Laterality: N/A;   LYMPH NODE BIOPSY     POLYPECTOMY  06/29/2015   Procedure: POLYPECTOMY;  Surgeon: Lamar CHRISTELLA Hollingshead, MD;  Location: [facility];  Service: Endoscopy;;  descending colon polyp removed via cold snare   Port-a-cath placement     SPACE OAR INSTILLATION N/A 05/06/2021    Procedure: SPACE OAR INSTILLATION;  Surgeon: Sherrilee Belvie CROME, MD;  Location: [facility];  Service: Urology;  Laterality: N/A;   THYROID  LOBECTOMY Right 12/04/2021   Procedure: THYROID  LOBECTOMY;  Surgeon: Carlie Clark, MD;  Location: [facility];  Service: ENT;  Laterality: Right;   Tooth implant       Allergies:  is allergic to tomato.  Medications:  Current Outpatient Medications  Medication Sig Dispense Refill   B Complex Vitamins (VITAMIN B COMPLEX PO) Take 1 capsule by mouth in the morning.     fluticasone  (FLONASE ) 50 MCG/ACT nasal spray Place 1 spray into both nostrils daily as needed for allergies or rhinitis.     latanoprost (XALATAN) 0.005 % ophthalmic solution Place 1 drop into both eyes at bedtime.     losartan  (COZAAR ) 25 MG tablet TAKE 1 TABLET(25 MG) BY MOUTH DAILY 30 tablet 2   Multiple Vitamin (MULTIVITAMIN WITH MINERALS) TABS tablet Take 1 tablet by mouth in the morning.     rosuvastatin  (CRESTOR ) 10 MG tablet Take 10 mg by mouth at bedtime.     tadalafil  (CIALIS ) 5 MG tablet Take 1 tablet (5 mg total) by mouth daily. 90 tablet 3   tamsulosin  (FLOMAX ) 0.4 MG CAPS capsule Take 1 capsule (0.4 mg total) by mouth 2 (two) times daily. 180 capsule 3   Aspirin-Salicylamide-Caffeine (BC HEADACHE POWDER PO) Take 1 packet by mouth daily as needed (headache). (Patient not taking: Reported on 03/22/2024)     No current facility-administered medications for this visit.     Physical Examination: ECOG PERFORMANCE STATUS: 0 - Asymptomatic  Vitals:   03/22/24 1345 03/22/24 1350  BP: (!) 148/78 (!) 153/73  Pulse: 84   Resp: 20   Temp: 97.9 F (36.6 C)    Filed Weights   03/22/24 1345  Weight: 233 lb 14.4 oz (106.1 kg)    GENERAL:alert, no distress and comfortable SKIN: skin color, texture, turgor are normal, no rashes or significant lesions EYES: normal, conjunctiva are pink and non-injected, sclera clear LYMPH:  no palpable lymphadenopathy in the cervical, axillary or  inguinal LUNGS: clear to auscultation and percussion with normal breathing effort HEART: regular rate & rhythm and no murmurs and no lower extremity edema ABDOMEN:abdomen soft, non-tender and normal bowel sounds Musculoskeletal:no cyanosis of digits and no clubbing  PSYCH: alert & oriented x 3 with fluent speech  Laboratory Data: I have reviewed the data as listed Lab Results  Component Value Date   WBC 4.3 12/04/2021   HGB 11.9 (L) 12/04/2021   HCT 36.9 (L) 12/04/2021   MCV 93.9 12/04/2021   PLT 143 (L) 12/04/2021      Chemistry      Component Value Date/Time   NA 142 12/04/2021 0750   NA 141 05/18/2017 0000   K 3.8 12/04/2021 0750   CL 107 12/04/2021 0750   CO2 23 12/04/2021 0750   BUN 19 12/04/2021 0750   BUN 13 05/18/2017 0000   CREATININE 1.06 12/04/2021 0750   CREATININE 1.02 10/16/2020 0949   GLU 102 05/18/2017 0000      Component Value Date/Time   CALCIUM  10.0 12/04/2021 0750   ALKPHOS 52 04/26/2015 0935  AST 14 10/16/2020 0949   ALT 12 10/16/2020 0949   BILITOT 0.5 10/16/2020 0949       Radiographic Studies: I have personally reviewed the radiological images as listed and agreed with the findings in the report.  CT CHEST WO CONTRAST EXAM: CT CHEST WITHOUT CONTRAST 03/07/2024 01:22:10 PM  TECHNIQUE: CT of the chest was performed without the administration of intravenous contrast. Multiplanar reformatted images are provided for review. Automated exposure control, iterative reconstruction, and/or weight based adjustment of the mA/kV was utilized to reduce the radiation dose to as low as reasonably achievable.  COMPARISON: CT chest 12/23/2023.  CLINICAL HISTORY: HISTORY OF PROSTATE CARCINOMA AND LYMPHOMA. CHONDROSCOPY PRESENT ON IMAGING OF LUNG. PRIOR  FINDINGS:  MEDIASTINUM: Heart and pericardium are unremarkable. The central airways are clear. Mild atherosclerotic plaques .  LYMPH NODES: No mediastinal or axillary lymphadenopathy. No gross  hilar adenopathy with limited evaluation on this noncontrast study.  LUNGS AND PLEURA: Left upper lobe: Stable 1.4 cm ground-glass airspace opacity. Interval development of a solid 6 mm pulmonary nodule (4:60). Left lower lobe: Stable 2.3 x 1.3 cm pulmonary nodule and interval decrease in size of the adjacent 1.3 x 0.9 cm pulmonary nodule (from 1.8 x 1.2 cm). Right upper lobe: Stable 1.1 cm ground-glass airspace opacity (4:58). Interval development of several solid pulmonary micronodules (4:35, 43, 62). Right lower lobe: Interval development of a solid subpleural micronodule (4:74). No pulmonary edema. No pleural effusion or pneumothorax.  SOFT TISSUES/BONES: No acute abnormality of the bones or soft tissues.  UPPER ABDOMEN: Stable 8 cm left hepatic lobe fluid density lesion.  IMPRESSION: 1. Stable 1.4 cm left upper lobe ground-glass opacity. Stable 1.1 cm right upper lobe ground-glass opacity. 2. Interval development of several new solid pulmonary nodules, including multiple right upper lobe micronodules, a right lower lobe subpleural micronodule, and a new 6 mm solid left upper lobe nodule. 3. Stable 2.3 x 1.3 cm left lower lobe pulmonary nodule with interval decrease in size of the adjacent nodule to 1.3 x 0.9 cm. 4. Fleischner classification does not apply for follow-up of nodules given history of malignancy.  Electronically signed by: Morgane Naveau MD 03/14/2024 02:04 AM EST RP Workstation: [id number]    ASSESSMENT & PLAN:  Patient is a 78 y.o. male presenting for abnormal CT scan findings  Assessment and Plan  Multiple lung nodules on CT scan Discussed that these can be benign in nature but malignancy needs to be ruled out Unlikely recurrence of any of the patients previous cancers   - Will obtain a PET scan to assess for malignancy. If concerning, will obtain a biopsy. 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  -Continue to follow with ENT  Stage IIA DLBCL diagnosed in 2005 Treated first with [facility] and then R-CHOP CR after cycle 2 Completed surveillance   Orders Placed This Encounter  Procedures   NM PET Image Initial (PI) Skull Base To Thigh    Standing Status:   Future    Expected Date:   03/29/2024    Expiration Date:   03/22/2025    If indicated for the ordered procedure, I authorize the administration of a radiopharmaceutical per Radiology protocol:   Yes    Preferred imaging location?:   [facility]  Release to patient:   Immediate    The total time spent in the appointment was 40 minutes encounter with patients including review of chart and various tests results, discussions about plan of care and coordination of care plan   All questions were answered. The patient knows to call the clinic with any problems, questions or concerns. No barriers to learning was detected.   William Joseph,acting as a neurosurgeon for Mickiel Dry, MD.,have documented all relevant documentation on the behalf of Mickiel Dry, MD,as directed by  Mickiel Dry, MD while in the presence of Mickiel Dry, MD.  I, Mickiel Dry MD, have reviewed the above documentation for accuracy and completeness, and I agree with the above.    Mickiel Dry, MD 12/16/202511:04 PM

## 2024-03-24 ENCOUNTER — Ambulatory Visit (HOSPITAL_COMMUNITY)
Admission: RE | Admit: 2024-03-24 | Discharge: 2024-03-24 | Disposition: A | Discharge status: Home / Self Care | Source: Ambulatory Visit | Attending: Oncology | Admitting: Oncology

## 2024-03-24 DIAGNOSIS — C61 Malignant neoplasm of prostate: Secondary | ICD-10-CM | POA: Insufficient documentation

## 2024-03-24 DIAGNOSIS — C73 Malignant neoplasm of thyroid gland: Secondary | ICD-10-CM | POA: Insufficient documentation

## 2024-03-24 DIAGNOSIS — Z8572 Personal history of non-Hodgkin lymphomas: Secondary | ICD-10-CM | POA: Diagnosis present

## 2024-03-24 DIAGNOSIS — R918 Other nonspecific abnormal finding of lung field: Secondary | ICD-10-CM | POA: Diagnosis present

## 2024-03-24 MED ORDER — FLUDEOXYGLUCOSE F - 18 (FDG) INJECTION
12.5000 | Freq: Once | INTRAVENOUS | Status: AC | PRN
  Administered 2024-03-24: 15:00:00 12.5 via INTRAVENOUS

## 2024-03-29 ENCOUNTER — Ambulatory Visit: Payer: Self-pay | Admitting: Internal Medicine

## 2024-04-04 ENCOUNTER — Inpatient Hospital Stay: Admitting: Oncology

## 2024-04-04 ENCOUNTER — Inpatient Hospital Stay

## 2024-04-04 ENCOUNTER — Encounter: Payer: Self-pay | Admitting: *Deleted

## 2024-04-11 ENCOUNTER — Inpatient Hospital Stay: Attending: Oncology | Admitting: Oncology

## 2024-04-11 VITALS — BP 173/85 | HR 54 | Temp 98.0°F | Resp 18 | Wt 232.6 lb

## 2024-04-11 DIAGNOSIS — C61 Malignant neoplasm of prostate: Secondary | ICD-10-CM

## 2024-04-11 DIAGNOSIS — Z8572 Personal history of non-Hodgkin lymphomas: Secondary | ICD-10-CM | POA: Diagnosis not present

## 2024-04-11 DIAGNOSIS — R918 Other nonspecific abnormal finding of lung field: Secondary | ICD-10-CM | POA: Diagnosis not present

## 2024-04-11 DIAGNOSIS — C73 Malignant neoplasm of thyroid gland: Secondary | ICD-10-CM | POA: Diagnosis not present

## 2024-04-11 NOTE — Progress Notes (Signed)
 " Patient Care Team: [name], FNP as PCP - General (Nurse Practitioner) Delford Maude BROCKS, MD as PCP - Cardiology (Cardiology) Sally, Clotilda POUR, MD (Inactive) as Consulting Physician (Hematology and Oncology) Davonna Siad, MD as Medical Oncologist (Medical Oncology) Celestia Joesph SQUIBB, RN as Oncology Nurse Navigator (Medical Oncology)  Clinic Day:  04/11/2024  Referring physician: [name], FNP   CHIEF COMPLAINT:  CC: Multiple lung nodules   ASSESSMENT & PLAN:   Assessment & Plan: William Joseph  is a 79 y.o. male with multiple lung nodules  Assessment and Plan Assessment & Plan Multiple lung nodules on CT scan Discussed that these can be benign in nature but malignancy needs to be ruled out Unlikely recurrence of any of the patients previous cancers    - We reviewed the PET scan findings together. Patient has a stable mildly hypermetabolic right lung nodule favoring infectious/inflammatory etiology. Recommended repeat CT in 3-4 months - Will order repeat CT. If the lesion is persistent or growing on the CT scan, will refer to pulm for EBUS with biopsy.  - If not too concerning, will closely monitor with frequent CT scans  Return to clinic after CT scan to discuss results and further management.    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   -Continue to follow with ENT   Stage IIA DLBCL  Diagnosed in 2005 Treated first with [facility] and then R-CHOP CR after cycle 2 Completed surveillance   The patient understands the plans discussed today and is in agreement with them.  He knows to contact our office if he develops concerns prior to his next appointment.  15 minutes of total time was spent for this patient encounter, including  preparation,face-to-face counseling with the patient and coordination of care, physical exam, and documentation of the encounter.   Siad Davonna, MD  [facility] [place] - A DEPT OF [facility] [address] [place] [address] Dept: [phone] Dept Fax: [phone]   No orders of the defined types were placed in this encounter.    ONCOLOGY HISTORY:   Diagnosis: Multiple lung nodules   -12/23/2023: CT chest: Stable 15 mm ground glass opacity within the left upper lobe. Nodular subpleural focal pulmonary infiltrate in the basilar intermediate segment of the left lower lobe and right costophrenic sulcus, possibly post-infectious or postinflammatory. Short-term follow-up imaging in 6-12 weeks would be helpful in documenting resolution.  -03/08/2024: CT chest: Stable 1.4 cm left upper lobe ground-glass opacity. Stable 1.1 cm right upper lobe ground-glass opacity. Interval development of several new solid pulmonary nodules, including multiple right upper lobe micronodules, a right lower lobe subpleural micronodule, and a new 6 mm solid left upper lobe nodule. Stable 2.3 x 1.3 cm left lower lobe pulmonary nodule with interval decrease in size of the adjacent nodule to 1.3 x 0.9 cm. -03/24/2024: PET scan:  Nodular consolidation in the medial segment right middle lobe is grossly stable from 03/07/2024 but new from 12/23/2023. An infectious/inflammatory etiology is favored. Recommend follow-up CT chest without contrast in 3-4 weeks in further evaluation, as malignancy cannot be definitively excluded.Additional areas of previously seen consolidation in the lower lobes have resolved or nearly completely resolved in the interval. Grossly stable area of ground-glass and mild architectural distortion in the lingula.  Recommend continued attention on follow-up.  Current Treatment:  TBD  INTERVAL HISTORY:   Discussed the use of AI scribe  software for clinical note transcription with the patient, who gave verbal consent to proceed.  History of Present Illness William Joseph is a 79 year old male with multiple prior malignancies who presents for follow-up of a right pulmonary nodule with PET uptake. He is accompanied by his wife today.   A right pulmonary nodule was first identified on imaging in December 2025, absent on prior imaging from September 2025. The nodule has remained stable in size since its initial detection. PET scan demonstrated uptake in the region of the nodule. No additional abnormal findings were noted on imaging.  He experiences chronic congestion and post-nasal drip, and was congested on the morning of the visit. He uses a humidifier at home for symptomatic relief. He denies cough and weight loss.   I have reviewed the past medical history, past surgical history, social history and family history with the patient and they are unchanged from previous note.  ALLERGIES:  is allergic to tomato.  MEDICATIONS:  Current Outpatient Medications  Medication Sig Dispense Refill   Aspirin-Salicylamide-Caffeine (BC HEADACHE POWDER PO) Take 1 packet by mouth daily as needed (headache). (Patient not taking: Reported on 03/22/2024)     B Complex Vitamins (VITAMIN B COMPLEX PO) Take 1 capsule by mouth in the morning.     fluticasone  (FLONASE ) 50 MCG/ACT nasal spray Place 1 spray into both nostrils daily as needed for allergies or rhinitis.     latanoprost (XALATAN) 0.005 % ophthalmic solution Place 1 drop into both eyes at bedtime.     losartan  (COZAAR ) 25 MG tablet TAKE 1 TABLET(25 MG) BY MOUTH DAILY 30 tablet 2   Multiple Vitamin (MULTIVITAMIN WITH MINERALS) TABS tablet Take 1 tablet by mouth in the morning.     rosuvastatin  (CRESTOR ) 10 MG tablet Take 10 mg by mouth at bedtime.     tadalafil  (CIALIS ) 5 MG tablet Take 1 tablet (5 mg total) by mouth daily. 90 tablet 3   tamsulosin  (FLOMAX ) 0.4 MG CAPS capsule Take 1  capsule (0.4 mg total) by mouth 2 (two) times daily. 180 capsule 3   No current facility-administered medications for this visit.     VITALS:  There were no vitals taken for this visit.  Wt Readings from Last 3 Encounters:  03/22/24 233 lb 14.4 oz (106.1 kg)  02/12/24 235 lb 1.6 oz (106.6 kg)  02/10/24 235 lb 1.6 oz (106.6 kg)    There is no height or weight on file to calculate BMI.  Performance status (ECOG): 0 - Asymptomatic  PHYSICAL EXAM:   GENERAL:alert, no distress and comfortable LYMPH:  no palpable lymphadenopathy in the cervical, axillary or inguinal LUNGS: clear to auscultation and percussion with normal breathing effort HEART: regular rate & rhythm and no murmurs and no lower extremity edema ABDOMEN:abdomen soft, non-tender and normal bowel sounds Musculoskeletal:no cyanosis of digits and no clubbing  NEURO: alert & oriented x 3 with fluent speech  LABORATORY DATA:  I have reviewed the data as listed     Component Value Date/Time   NA 142 12/04/2021 0750   NA 141 05/18/2017 0000   K 3.8 12/04/2021 0750   CL 107 12/04/2021 0750   CO2 23 12/04/2021 0750   GLUCOSE 107 (H) 12/04/2021 0750   BUN 19 12/04/2021 0750   BUN 13 05/18/2017 0000   CREATININE 1.06 12/04/2021 0750   CREATININE 1.02 10/16/2020 0949  CALCIUM  10.0 12/04/2021 0750   PROT 6.7 10/16/2020 0949   ALBUMIN 4.3 05/18/2017 0000   AST 14 10/16/2020 0949   ALT 12 10/16/2020 0949   ALKPHOS 52 04/26/2015 0935   BILITOT 0.5 10/16/2020 0949   GFRNONAA >60 12/04/2021 0750   GFRNONAA 73 07/12/2020 0818   GFRAA 85 07/12/2020 0818     Lab Results  Component Value Date   WBC 4.3 12/04/2021   NEUTROABS 2.2 05/02/2021   HGB 11.9 (L) 12/04/2021   HCT 36.9 (L) 12/04/2021   MCV 93.9 12/04/2021   PLT 143 (L) 12/04/2021      Chemistry      Component Value Date/Time   NA 142 12/04/2021 0750   NA 141 05/18/2017 0000   K 3.8 12/04/2021 0750   CL 107 12/04/2021 0750   CO2 23 12/04/2021 0750    BUN 19 12/04/2021 0750   BUN 13 05/18/2017 0000   CREATININE 1.06 12/04/2021 0750   CREATININE 1.02 10/16/2020 0949   GLU 102 05/18/2017 0000      Component Value Date/Time   CALCIUM  10.0 12/04/2021 0750   ALKPHOS 52 04/26/2015 0935   AST 14 10/16/2020 0949   ALT 12 10/16/2020 0949   BILITOT 0.5 10/16/2020 0949        RADIOGRAPHIC STUDIES: I have personally reviewed the radiological images as listed and agreed with the findings in the report.  NM PET Image Initial (PI) Skull Base To Thigh CLINICAL DATA:  Subsequent treatment strategy for papillary thyroid  cancer with history of Hodgkin lymphoma, prostate cancer, lung nodules.  EXAM: NUCLEAR MEDICINE PET SKULL BASE TO THIGH  TECHNIQUE: 12.5 mCi F-18 FDG was injected intravenously. Full-ring PET imaging was performed from the skull base to thigh after the radiotracer. CT data was obtained and used for attenuation correction and anatomic localization.  Fasting blood glucose: 104 mg/dl  COMPARISON:  CT chest 03/07/2024 and PET 03/13/2021.  FINDINGS: Mediastinal blood pool activity: SUV max 3.2  Liver activity: SUV max NA  NECK:  No abnormal hypermetabolism.  Incidental CT findings:  Retention cyst or polyp in the left maxillary sinus.  CHEST:  Nodular consolidation in the medial segment right middle lobe measures 1.6 x 2.4 cm, similar to 03/07/2024, SUV max 3.4, and new from 12/23/2023. No additional abnormal hypermetabolism.  Incidental CT findings:  Atherosclerotic calcification of the aorta, aortic valve and coronary arteries. Heart is enlarged. No pericardial or pleural effusion. Additional tiny pulmonary nodules are too small for PET resolution. Previously seen areas of consolidation in the lower lobes have largely resolved in the interval. Similar area of ground-glass and architectural distortion in the lingula (203/31).  ABDOMEN/PELVIS:  No abnormal hypermetabolism.  Incidental CT  findings:  Hepatic cysts. Low-attenuation lesions in the kidneys. No specific follow-up necessary.  SKELETON:  No abnormal hypermetabolism.  Incidental CT findings:  Degenerative changes in the spine.  IMPRESSION: 1. Nodular consolidation in the medial segment right middle lobe is grossly stable from 03/07/2024 but new from 12/23/2023. An infectious/inflammatory etiology is favored. Recommend follow-up CT chest without contrast in 3-4 weeks in further evaluation, as malignancy cannot be definitively excluded. 2. Otherwise, no evidence of recurrent or metastatic disease. 3. Additional areas of previously seen consolidation in the lower lobes have resolved or nearly completely resolved in the interval. 4. Grossly stable area of ground-glass and mild architectural distortion in the lingula. Recommend continued attention on follow-up. 5. Aortic atherosclerosis (ICD10-I70.0). Coronary artery calcification.  Electronically Signed   By: Newell Eke M.D.  On: 04/08/2024 16:25    "

## 2024-04-11 NOTE — Patient Instructions (Addendum)
 [facility] at [facility] Discharge Instructions   You were seen and examined today by Dr. Davonna.  She reviewed the results of your PET scan which did not conclusively show any evidence of cancer. We will repeat a CT scan soon to monitor the lung area.    Return as scheduled.    Thank you for choosing [facility] at [facility] to provide your oncology and hematology care.  To afford each patient quality time with our provider, please arrive at least 15 minutes before your scheduled appointment time.   If you have a lab appointment with the Cancer Center please come in thru the Main Entrance and check in at the main information desk.  You need to re-schedule your appointment should you arrive 10 or more minutes late.  We strive to give you quality time with our providers, and arriving late affects you and other patients whose appointments are after yours.  Also, if you no show three or more times for appointments you may be dismissed from the clinic at the providers discretion.     Again, thank you for choosing [facility].  Our hope is that these requests will decrease the amount of time that you wait before being seen by our physicians.       _____________________________________________________________  Should you have questions after your visit to [facility], please contact our office at [phone] and follow the prompts.  Our office hours are 8:00 a.m. and 4:30 p.m. Monday - Friday.  Please note that voicemails left after 4:00 p.m. may not be returned until the following business day.  We are closed weekends and major holidays.  You do have access to a nurse 24-7, just call the main number to the clinic [phone] and do not press any options, hold on the line and a nurse will answer the phone.    For prescription refill requests, have your pharmacy contact our office and allow 72 hours.    Due to Covid, you  will need to wear a mask upon entering the hospital. If you do not have a mask, a mask will be given to you at the Main Entrance upon arrival. For doctor visits, patients may have 1 support person age 67 or older with them. For treatment visits, patients can not have anyone with them due to social distancing guidelines and our immunocompromised population.

## 2024-04-25 ENCOUNTER — Ambulatory Visit (HOSPITAL_COMMUNITY)
Admission: RE | Admit: 2024-04-25 | Discharge: 2024-04-25 | Disposition: A | Discharge status: Home / Self Care | Source: Ambulatory Visit | Attending: Oncology | Admitting: Oncology

## 2024-04-25 DIAGNOSIS — R918 Other nonspecific abnormal finding of lung field: Secondary | ICD-10-CM | POA: Insufficient documentation

## 2024-04-25 MED ORDER — IOHEXOL 300 MG/ML  SOLN
75.0000 mL | Freq: Once | INTRAMUSCULAR | Status: AC | PRN
  Administered 2024-04-25: 75 mL via INTRAVENOUS

## 2024-05-04 ENCOUNTER — Inpatient Hospital Stay: Admitting: Oncology

## 2024-05-04 VITALS — BP 154/83 | HR 99 | Temp 97.8°F | Resp 19 | Ht 74.0 in | Wt 233.0 lb

## 2024-05-04 DIAGNOSIS — C61 Malignant neoplasm of prostate: Secondary | ICD-10-CM

## 2024-05-04 DIAGNOSIS — R918 Other nonspecific abnormal finding of lung field: Secondary | ICD-10-CM

## 2024-05-04 DIAGNOSIS — Z8572 Personal history of non-Hodgkin lymphomas: Secondary | ICD-10-CM | POA: Diagnosis not present

## 2024-05-04 DIAGNOSIS — C73 Malignant neoplasm of thyroid gland: Secondary | ICD-10-CM

## 2024-05-04 NOTE — Progress Notes (Signed)
 " Patient Care Team: [name], FNP as PCP - General (Nurse Practitioner) Delford Maude BROCKS, MD as PCP - Cardiology (Cardiology) Sally, Clotilda POUR, MD (Inactive) as Consulting Physician (Hematology and Oncology) Davonna Siad, MD as Medical Oncologist (Medical Oncology) Celestia Joesph SQUIBB, RN as Oncology Nurse Navigator (Medical Oncology)  Clinic Day:  05/04/2024  Referring physician: [name], FNP   CHIEF COMPLAINT:  CC: Multiple lung nodules   ASSESSMENT & PLAN:   Assessment & Plan: William Joseph  is a 79 y.o. male with multiple lung nodules  Assessment and Plan Assessment & Plan Multiple lung nodules on CT scan Discussed that these can be benign in nature but malignancy needs to be ruled out Unlikely recurrence of any of the patients previous cancers    - We reviewed the CT scan findings together. Patient has a stable to slightly smaller right lung nodule favoring infectious/inflammatory etiology.  - Will repeat CT scan in 3-6 months   Return to clinic in 4 months after CT scan to discuss results and further management.    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   -Continue to follow with ENT   Stage IIA DLBCL  Diagnosed in 2005 Treated first with [facility] and then [facility] after cycle 2 Completed surveillance   Multiple lung nodules Pulmonary nodules decreased in size and density, suggesting resolving infection, not malignancy. Asymptomatic with no acute pathology. - Ordered repeat chest imaging in four months.   The patient understands the plans discussed today and is in agreement with them.  He knows to contact our office if he develops concerns prior to his next appointment.  15 minutes of total time was spent for this patient  encounter, including preparation,face-to-face counseling with the patient and coordination of care, physical exam, and documentation of the encounter.   Siad Davonna, MD  [facility] [place] - A DEPT OF [facility] [address] [place] [address] Dept: [phone] Dept Fax: [phone]   Orders Placed This Encounter  Procedures   CT CHEST W CONTRAST    Standing Status:   Future    Expected Date:   08/02/2024    Expiration Date:   05/04/2025    If indicated for the ordered procedure, I authorize the administration of contrast media per Radiology protocol:   Yes    Does the patient have a contrast media/X-ray dye allergy?:   No    Preferred imaging location?:   [facility]     ONCOLOGY HISTORY:   Diagnosis: Multiple lung nodules   -12/23/2023: CT chest: Stable 15 mm ground glass opacity within the left upper lobe. Nodular subpleural focal pulmonary infiltrate in the basilar intermediate segment of the left lower lobe and right costophrenic sulcus, possibly post-infectious or postinflammatory. Short-term follow-up imaging in 6-12 weeks would be helpful in documenting resolution.  -03/08/2024: CT chest: Stable 1.4 cm left upper lobe ground-glass opacity. Stable 1.1 cm right upper lobe ground-glass opacity. Interval development of several new solid pulmonary nodules, including multiple right upper lobe micronodules, a right lower lobe subpleural micronodule, and a new 6 mm solid left upper lobe nodule. Stable 2.3 x 1.3 cm left lower lobe pulmonary nodule with interval decrease in size of the adjacent nodule to 1.3 x 0.9 cm. -  03/24/2024: PET scan:  Nodular consolidation in the medial segment right middle lobe is grossly stable from 03/07/2024 but new from 12/23/2023.  -04/25/2024: CT chest: There are multiple, stable, sub 4 mm nodules throughout bilateral lungs. Several of the nodules have decreased in density when compared  to the prior exam. No progressive or new/suspicious lung nodule.  Current Treatment:  TBD  INTERVAL HISTORY:   Discussed the use of AI scribe software for clinical note transcription with the patient, who gave verbal consent to proceed.  History of Present Illness William Joseph is a 79 year old male presenting for follow-up of multiple pulmonary nodules. He is accompanied by his wife today.   He has not developed new respiratory symptoms and has had no issues between scans.  He experiences intermittent swelling in the right upper chest, most noticeable at night between 2-4 AM, even when the room temperature is 68 degrees. The swelling is associated with congestion and mild soreness but does not cause significant pain or functional limitation. He notes that this swelling has been present since birth and typically resolves spontaneously.  He reports decreased energy, which he attributes to aging. No other specific complaints or new symptoms are present.   I have reviewed the past medical history, past surgical history, social history and family history with the patient and they are unchanged from previous note.  ALLERGIES:  is allergic to tomato.  MEDICATIONS:  Current Outpatient Medications  Medication Sig Dispense Refill   Aspirin-Salicylamide-Caffeine (BC HEADACHE POWDER PO) Take 1 packet by mouth daily as needed (headache). (Patient not taking: Reported on 03/22/2024)     B Complex Vitamins (VITAMIN B COMPLEX PO) Take 1 capsule by mouth in the morning.     fluticasone  (FLONASE ) 50 MCG/ACT nasal spray Place 1 spray into both nostrils daily as needed for allergies or rhinitis.     latanoprost (XALATAN) 0.005 % ophthalmic solution Place 1 drop into both eyes at bedtime.     losartan  (COZAAR ) 25 MG tablet TAKE 1 TABLET(25 MG) BY MOUTH DAILY 30 tablet 2   Multiple Vitamin (MULTIVITAMIN WITH MINERALS) TABS tablet Take 1 tablet by mouth in the morning.     rosuvastatin  (CRESTOR ) 10  MG tablet Take 10 mg by mouth at bedtime.     tadalafil  (CIALIS ) 5 MG tablet Take 1 tablet (5 mg total) by mouth daily. 90 tablet 3   tamsulosin  (FLOMAX ) 0.4 MG CAPS capsule Take 1 capsule (0.4 mg total) by mouth 2 (two) times daily. 180 capsule 3   No current facility-administered medications for this visit.     VITALS:  Blood pressure (!) 154/83, pulse 99, temperature 97.8 F (36.6 C), temperature source Tympanic, resp. rate 19, height 6' 2 (1.88 m), weight 233 lb (105.7 kg), SpO2 98%.  Wt Readings from Last 3 Encounters:  05/04/24 233 lb (105.7 kg)  04/11/24 232 lb 9.6 oz (105.5 kg)  03/22/24 233 lb 14.4 oz (106.1 kg)    Body mass index is 29.92 kg/m.  Performance status (ECOG): 0 - Asymptomatic  PHYSICAL EXAM:   GENERAL:alert, no distress and comfortable LYMPH:  no palpable lymphadenopathy in the cervical, axillary or inguinal LUNGS: clear to auscultation and percussion with normal breathing effort HEART: regular rate & rhythm and no murmurs and no lower extremity edema ABDOMEN:abdomen soft, non-tender and normal bowel sounds Musculoskeletal:no cyanosis of digits and no clubbing  NEURO: alert & oriented x 3 with fluent speech  LABORATORY DATA:  I have reviewed the data as listed  Component Value Date/Time   NA 142 12/04/2021 0750   NA 141 05/18/2017 0000   K 3.8 12/04/2021 0750   CL 107 12/04/2021 0750   CO2 23 12/04/2021 0750   GLUCOSE 107 (H) 12/04/2021 0750   BUN 19 12/04/2021 0750   BUN 13 05/18/2017 0000   CREATININE 1.06 12/04/2021 0750   CREATININE 1.02 10/16/2020 0949   CALCIUM  10.0 12/04/2021 0750   PROT 6.7 10/16/2020 0949   ALBUMIN 4.3 05/18/2017 0000   AST 14 10/16/2020 0949   ALT 12 10/16/2020 0949   ALKPHOS 52 04/26/2015 0935   BILITOT 0.5 10/16/2020 0949   GFRNONAA >60 12/04/2021 0750   GFRNONAA 73 07/12/2020 0818   GFRAA 85 07/12/2020 0818     Lab Results  Component Value Date   WBC 4.3 12/04/2021   NEUTROABS 2.2 05/02/2021   HGB  11.9 (L) 12/04/2021   HCT 36.9 (L) 12/04/2021   MCV 93.9 12/04/2021   PLT 143 (L) 12/04/2021      Chemistry      Component Value Date/Time   NA 142 12/04/2021 0750   NA 141 05/18/2017 0000   K 3.8 12/04/2021 0750   CL 107 12/04/2021 0750   CO2 23 12/04/2021 0750   BUN 19 12/04/2021 0750   BUN 13 05/18/2017 0000   CREATININE 1.06 12/04/2021 0750   CREATININE 1.02 10/16/2020 0949   GLU 102 05/18/2017 0000      Component Value Date/Time   CALCIUM  10.0 12/04/2021 0750   ALKPHOS 52 04/26/2015 0935   AST 14 10/16/2020 0949   ALT 12 10/16/2020 0949   BILITOT 0.5 10/16/2020 0949        RADIOGRAPHIC STUDIES: I have personally reviewed the radiological images as listed and agreed with the findings in the report.  CT CHEST W CONTRAST CLINICAL DATA:  Lung nodule, < 6mm, high cancer risk.  EXAM: CT CHEST WITH CONTRAST  TECHNIQUE: Multidetector CT imaging of the chest was performed during intravenous contrast administration.  RADIATION DOSE REDUCTION: This exam was performed according to the departmental dose-optimization program which includes automated exposure control, adjustment of the mA and/or kV according to patient size and/or use of iterative reconstruction technique.  CONTRAST:  75mL OMNIPAQUE  IOHEXOL  300 MG/ML  SOLN  COMPARISON:  CT scan chest from 03/07/2024.  FINDINGS: Cardiovascular: Normal cardiac size. No pericardial effusion. No aortic aneurysm. There are coronary artery calcifications, in keeping with coronary artery disease. There are also mild to moderate peripheral atherosclerotic vascular calcifications of thoracic aorta and its major branches.  Mediastinum/Nodes: Surgically absent right thyroid  lobe. Stable appearance of left thyroid  lobe. No solid / cystic mediastinal masses. The esophagus is nondistended precluding optimal assessment. No axillary, mediastinal or hilar lymphadenopathy by size criteria.  Lungs/Pleura: The central  tracheo-bronchial tree is patent. There are patchy areas of linear, plate-like atelectasis and/or scarring throughout bilateral lungs. No mass or consolidation. No pleural effusion or pneumothorax. There are multiple, stable, sub 4 mm nodules throughout bilateral lungs (marked with electronic arrow sign on series 2013). Several of the nodules have decreased in density when compared to the prior exam. No progressive or new/suspicious lung nodule.  Upper Abdomen: There is a small sliding hiatal hernia. There is a partially exophytic cyst arising from the left hepatic lobe. There are partially imaged cysts in the left kidney. Remaining visualized upper abdominal viscera within normal limits.  Musculoskeletal: The visualized soft tissues of the chest wall are grossly unremarkable. No suspicious osseous lesions. There are mild multilevel degenerative  changes in the visualized spine.  IMPRESSION: 1. There are multiple, stable, sub 4 mm nodules throughout bilateral lungs. Several of the nodules have decreased in density when compared to the prior exam. No progressive or new/suspicious lung nodule. 2. Multiple other nonacute observations, as described above.  Aortic Atherosclerosis (ICD10-I70.0).  Electronically Signed   By: Ree Molt M.D.   On: 04/25/2024 14:24    "

## 2024-05-04 NOTE — Patient Instructions (Signed)
 [facility] at [facility] Discharge Instructions   You were seen and examined today by Dr. Davonna.  She reviewed the results of your lab work which are normal/stable.   We will see you back in 3 months. We will repeat lab work and a CT scan prior to this visit.    Return as scheduled.    Thank you for choosing [facility] at [facility] to provide your oncology and hematology care.  To afford each patient quality time with our provider, please arrive at least 15 minutes before your scheduled appointment time.   If you have a lab appointment with the Cancer Center please come in thru the Main Entrance and check in at the main information desk.  You need to re-schedule your appointment should you arrive 10 or more minutes late.  We strive to give you quality time with our providers, and arriving late affects you and other patients whose appointments are after yours.  Also, if you no show three or more times for appointments you may be dismissed from the clinic at the providers discretion.     Again, thank you for choosing [facility].  Our hope is that these requests will decrease the amount of time that you wait before being seen by our physicians.       _____________________________________________________________  Should you have questions after your visit to [facility], please contact our office at [phone] and follow the prompts.  Our office hours are 8:00 a.m. and 4:30 p.m. Monday - Friday.  Please note that voicemails left after 4:00 p.m. may not be returned until the following business day.  We are closed weekends and major holidays.  You do have access to a nurse 24-7, just call the main number to the clinic [phone] and do not press any options, hold on the line and a nurse will answer the phone.    For prescription refill requests, have your pharmacy contact our office and allow 72 hours.    Due to  Covid, you will need to wear a mask upon entering the hospital. If you do not have a mask, a mask will be given to you at the Main Entrance upon arrival. For doctor visits, patients may have 1 support person age 77 or older with them. For treatment visits, patients can not have anyone with them due to social distancing guidelines and our immunocompromised population.

## 2024-08-02 ENCOUNTER — Other Ambulatory Visit (HOSPITAL_COMMUNITY)

## 2024-08-02 ENCOUNTER — Inpatient Hospital Stay

## 2024-08-09 ENCOUNTER — Inpatient Hospital Stay: Admitting: Oncology

## 2025-03-13 ENCOUNTER — Ambulatory Visit: Admitting: Urology
